# Patient Record
Sex: Female | Born: 1980 | Race: White | Hispanic: No | Marital: Married | Smoking: Current every day smoker
Health system: Southern US, Community
[De-identification: ages and names within clinical notes are randomized; demographics above are authoritative.]

## PROBLEM LIST (undated history)

## (undated) ENCOUNTER — Inpatient Hospital Stay: Payer: Self-pay

## (undated) DIAGNOSIS — G629 Polyneuropathy, unspecified: Secondary | ICD-10-CM

## (undated) DIAGNOSIS — R87629 Unspecified abnormal cytological findings in specimens from vagina: Secondary | ICD-10-CM

## (undated) DIAGNOSIS — O139 Gestational [pregnancy-induced] hypertension without significant proteinuria, unspecified trimester: Secondary | ICD-10-CM

## (undated) DIAGNOSIS — R519 Headache, unspecified: Secondary | ICD-10-CM

## (undated) DIAGNOSIS — D649 Anemia, unspecified: Secondary | ICD-10-CM

## (undated) DIAGNOSIS — M1990 Unspecified osteoarthritis, unspecified site: Secondary | ICD-10-CM

## (undated) HISTORY — PX: REPAIR VAGINAL CUFF: SHX6067

---

## 2003-12-28 ENCOUNTER — Observation Stay: Payer: Self-pay | Admitting: Obstetrics and Gynecology

## 2004-01-22 ENCOUNTER — Inpatient Hospital Stay: Payer: Self-pay | Admitting: Obstetrics and Gynecology

## 2004-02-06 ENCOUNTER — Ambulatory Visit: Payer: Self-pay | Admitting: Family Medicine

## 2006-03-23 ENCOUNTER — Encounter: Payer: Self-pay | Admitting: Family Medicine

## 2006-07-17 ENCOUNTER — Inpatient Hospital Stay: Payer: Self-pay | Admitting: Obstetrics and Gynecology

## 2009-10-10 ENCOUNTER — Emergency Department: Payer: Self-pay | Admitting: Internal Medicine

## 2009-12-17 ENCOUNTER — Emergency Department: Payer: Self-pay | Admitting: Unknown Physician Specialty

## 2010-12-06 ENCOUNTER — Ambulatory Visit: Payer: Self-pay

## 2010-12-07 ENCOUNTER — Ambulatory Visit: Payer: Self-pay | Admitting: Obstetrics and Gynecology

## 2011-01-03 ENCOUNTER — Observation Stay: Payer: Self-pay | Admitting: Obstetrics and Gynecology

## 2011-01-18 ENCOUNTER — Observation Stay: Payer: Self-pay | Admitting: Obstetrics and Gynecology

## 2011-01-21 ENCOUNTER — Observation Stay: Payer: Self-pay

## 2011-02-05 ENCOUNTER — Inpatient Hospital Stay: Payer: Self-pay | Admitting: Obstetrics and Gynecology

## 2011-02-05 DIAGNOSIS — O1495 Unspecified pre-eclampsia, complicating the puerperium: Secondary | ICD-10-CM

## 2011-02-12 ENCOUNTER — Emergency Department: Payer: Self-pay | Admitting: Internal Medicine

## 2011-02-12 IMAGING — CT CT HEAD WITHOUT CONTRAST
2 series · 16 of 30 positions shown, 20 images · non-contrast
Comparison: none

REASON FOR EXAM: ha
COMMENTS:

PROCEDURE:     CT  - CT HEAD WITHOUT CONTRAST  - [DATE]  [DATE]
RESULT:     History: Headache.

[Series 2: without · axial · non-contrast · 0.44mm/px · z∈[+146,+266]mm · 13 of 30 slices shown, 17 images]
[im 3/30  brain]
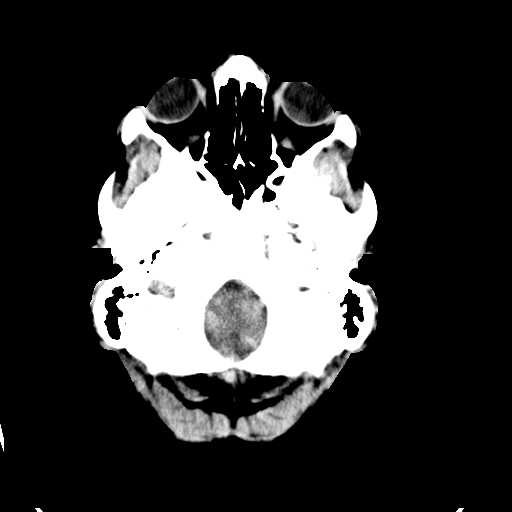
[im 3/30  bone]
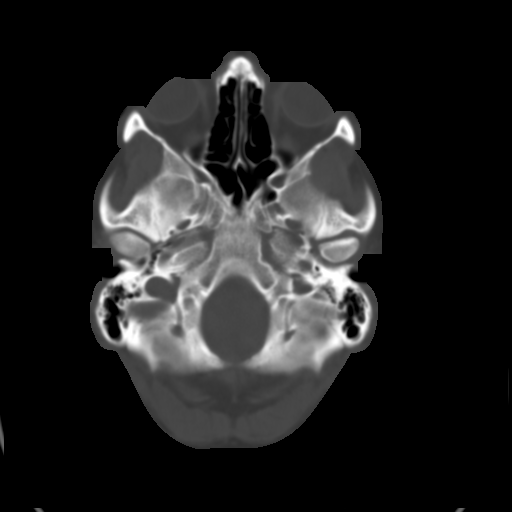
[im 5/30  brain]
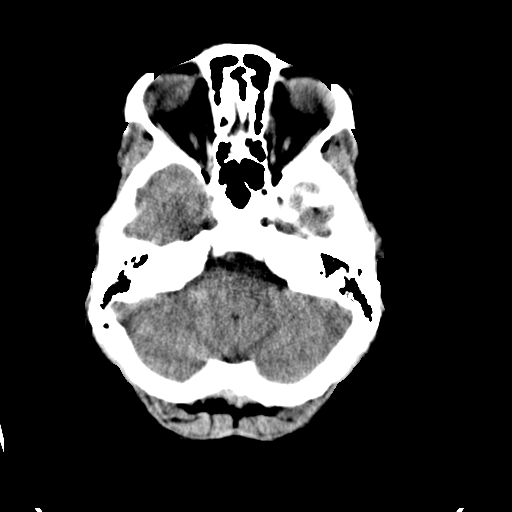
[im 7/30  brain]
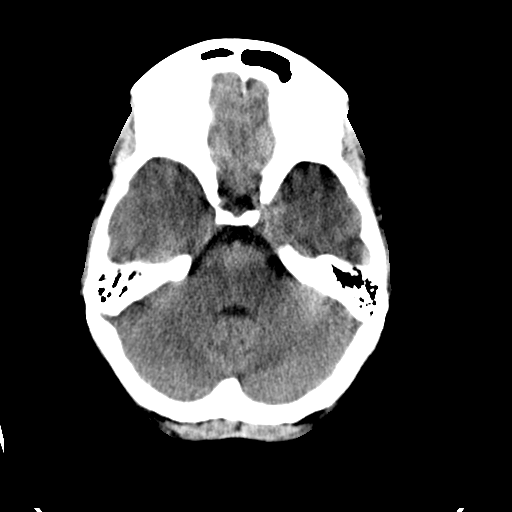
[im 9/30  brain]
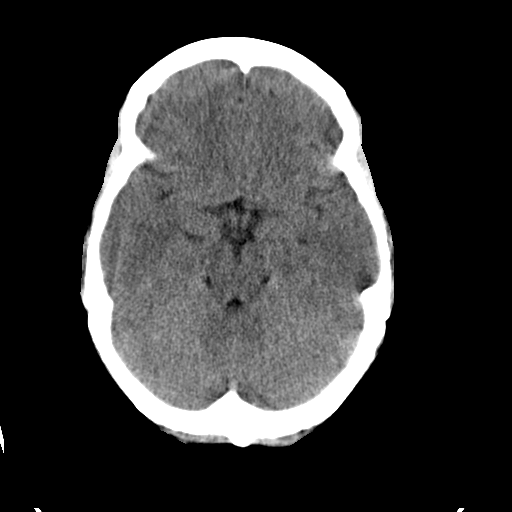
[im 11/30  brain]
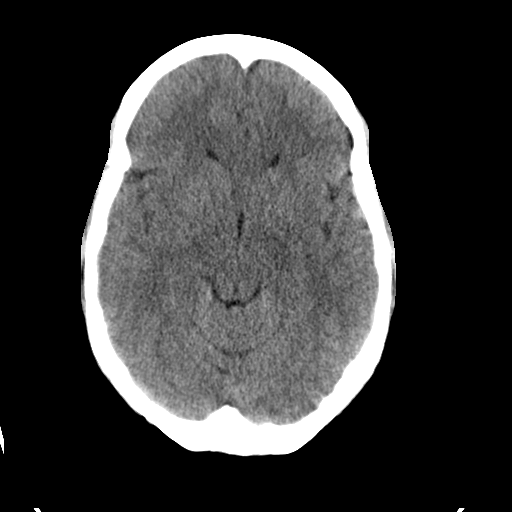
[im 11/30  bone]
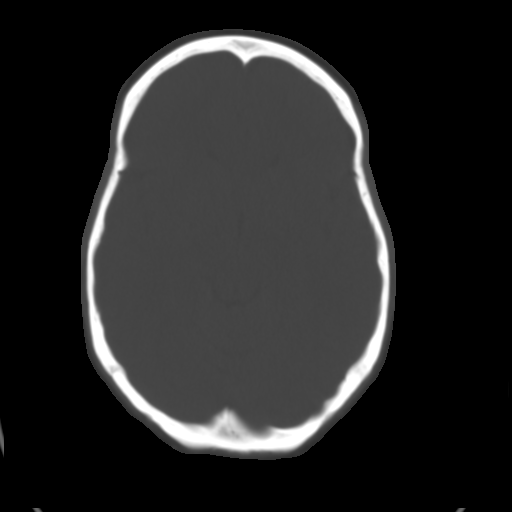
[im 13/30  brain]
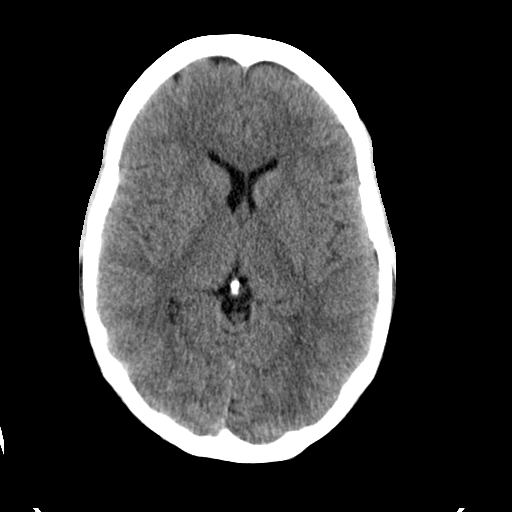
[im 15/30  brain]
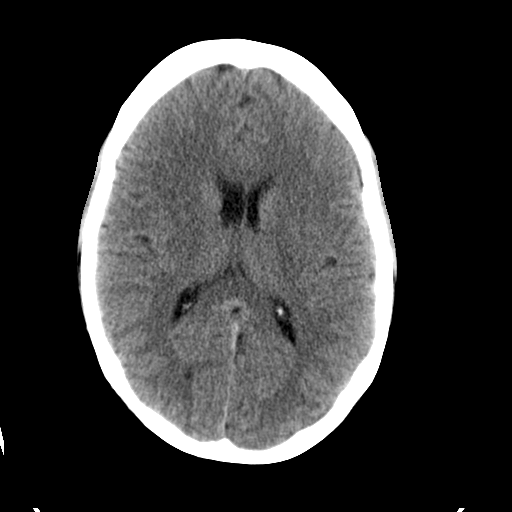
[im 17/30  brain]
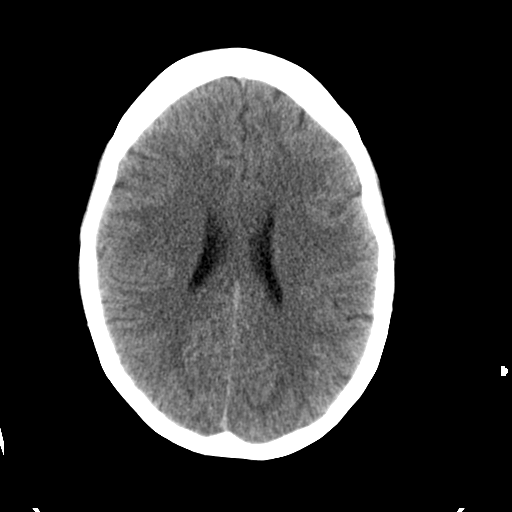
[im 19/30  brain]
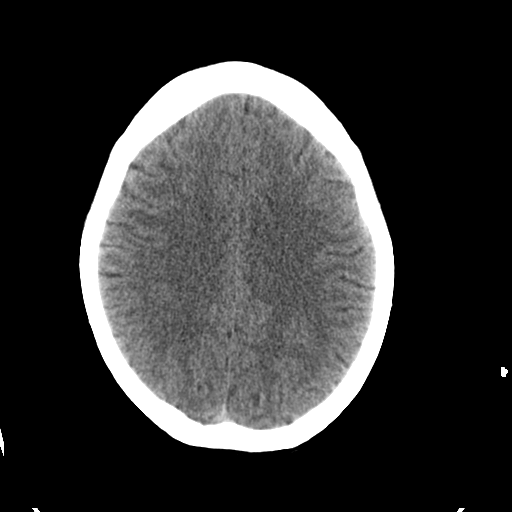
[im 19/30  bone]
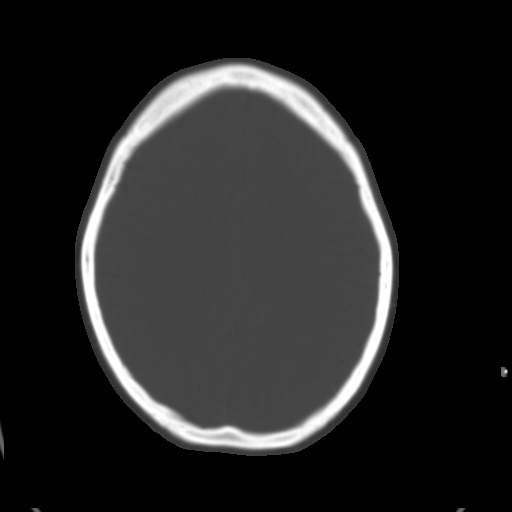
[im 21/30  brain]
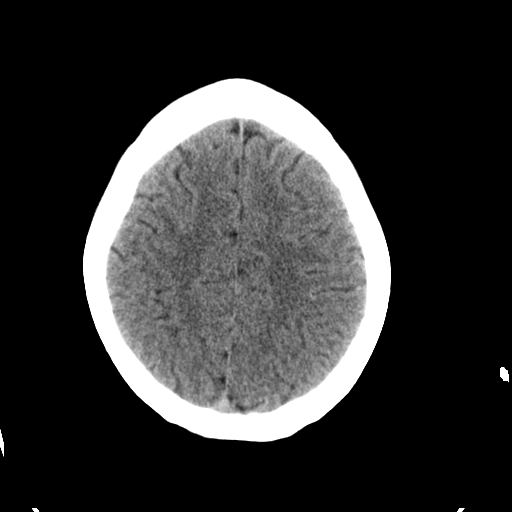
[im 23/30  brain]
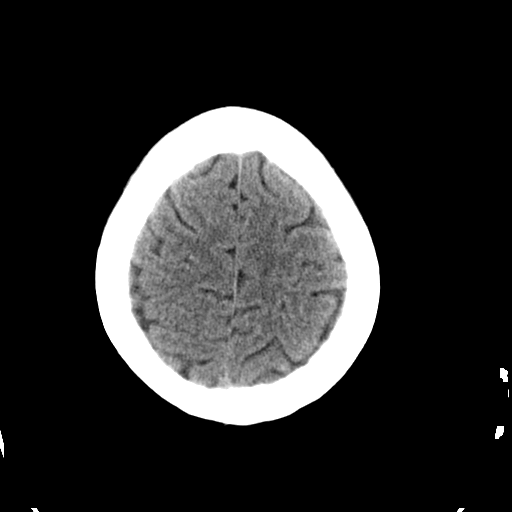
[im 25/30  brain]
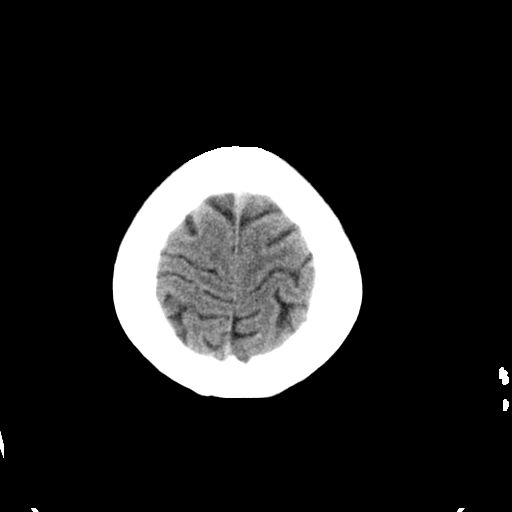
[im 27/30  brain]
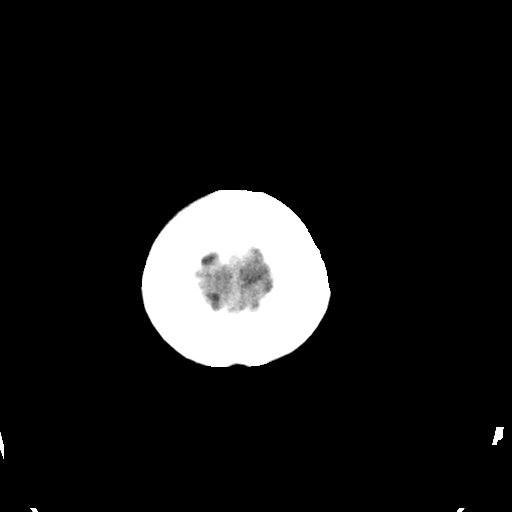
[im 27/30  bone]
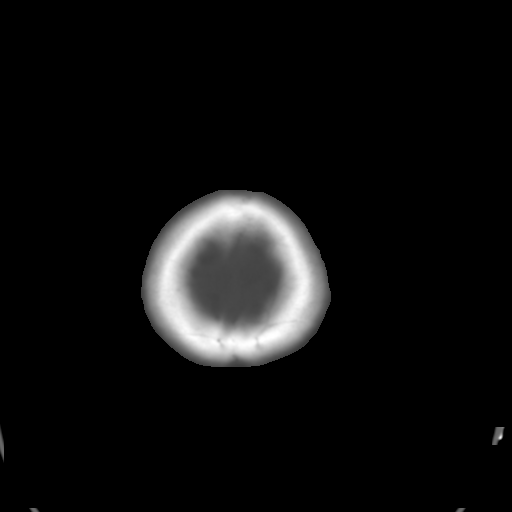

[Series 3: bone · axial · 0.44mm/px · z∈[+146,+186]mm · 3 of 30 slices shown]
[im 3/30  bone]
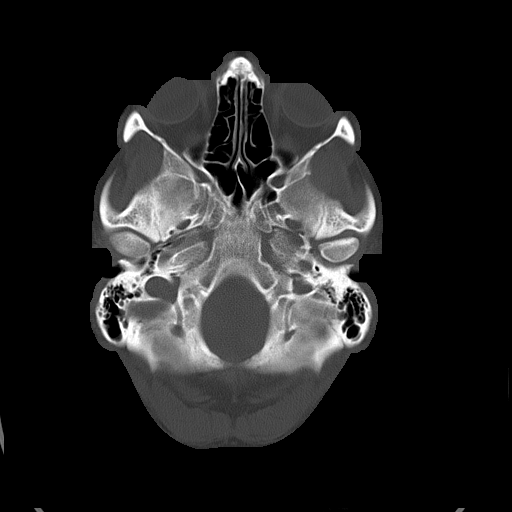
[im 7/30  bone]
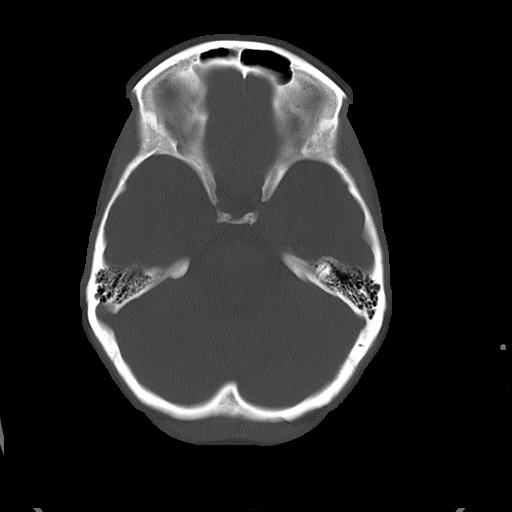
[im 11/30  bone]
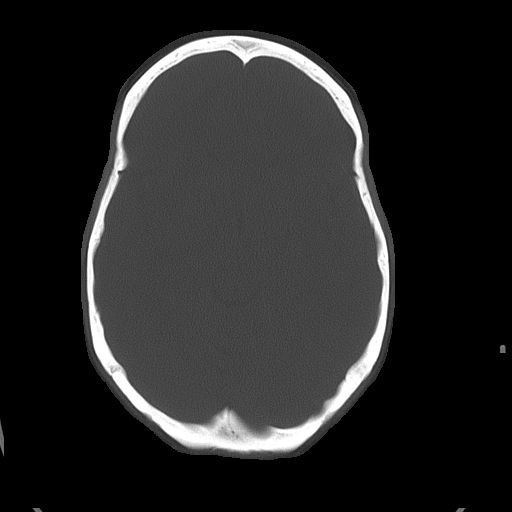

[16 of 30 positions shown; findings below may reference images not displayed]

FINDINGS: Standard nonenhanced CT obtained. No mass lesion. No
hydrocephalus. No hemorrhage. No acute bony abnormality.
IMPRESSION: No acute abnormality.

## 2011-02-12 IMAGING — CR DG CHEST 2V
1 series · 2 of 2 positions shown · non-contrast
Comparison: none

REASON FOR EXAM: sob
COMMENTS:

[Series 1: w chest pa · 0.14mm/px · 2 of 2 slices shown]
[im 1/2]
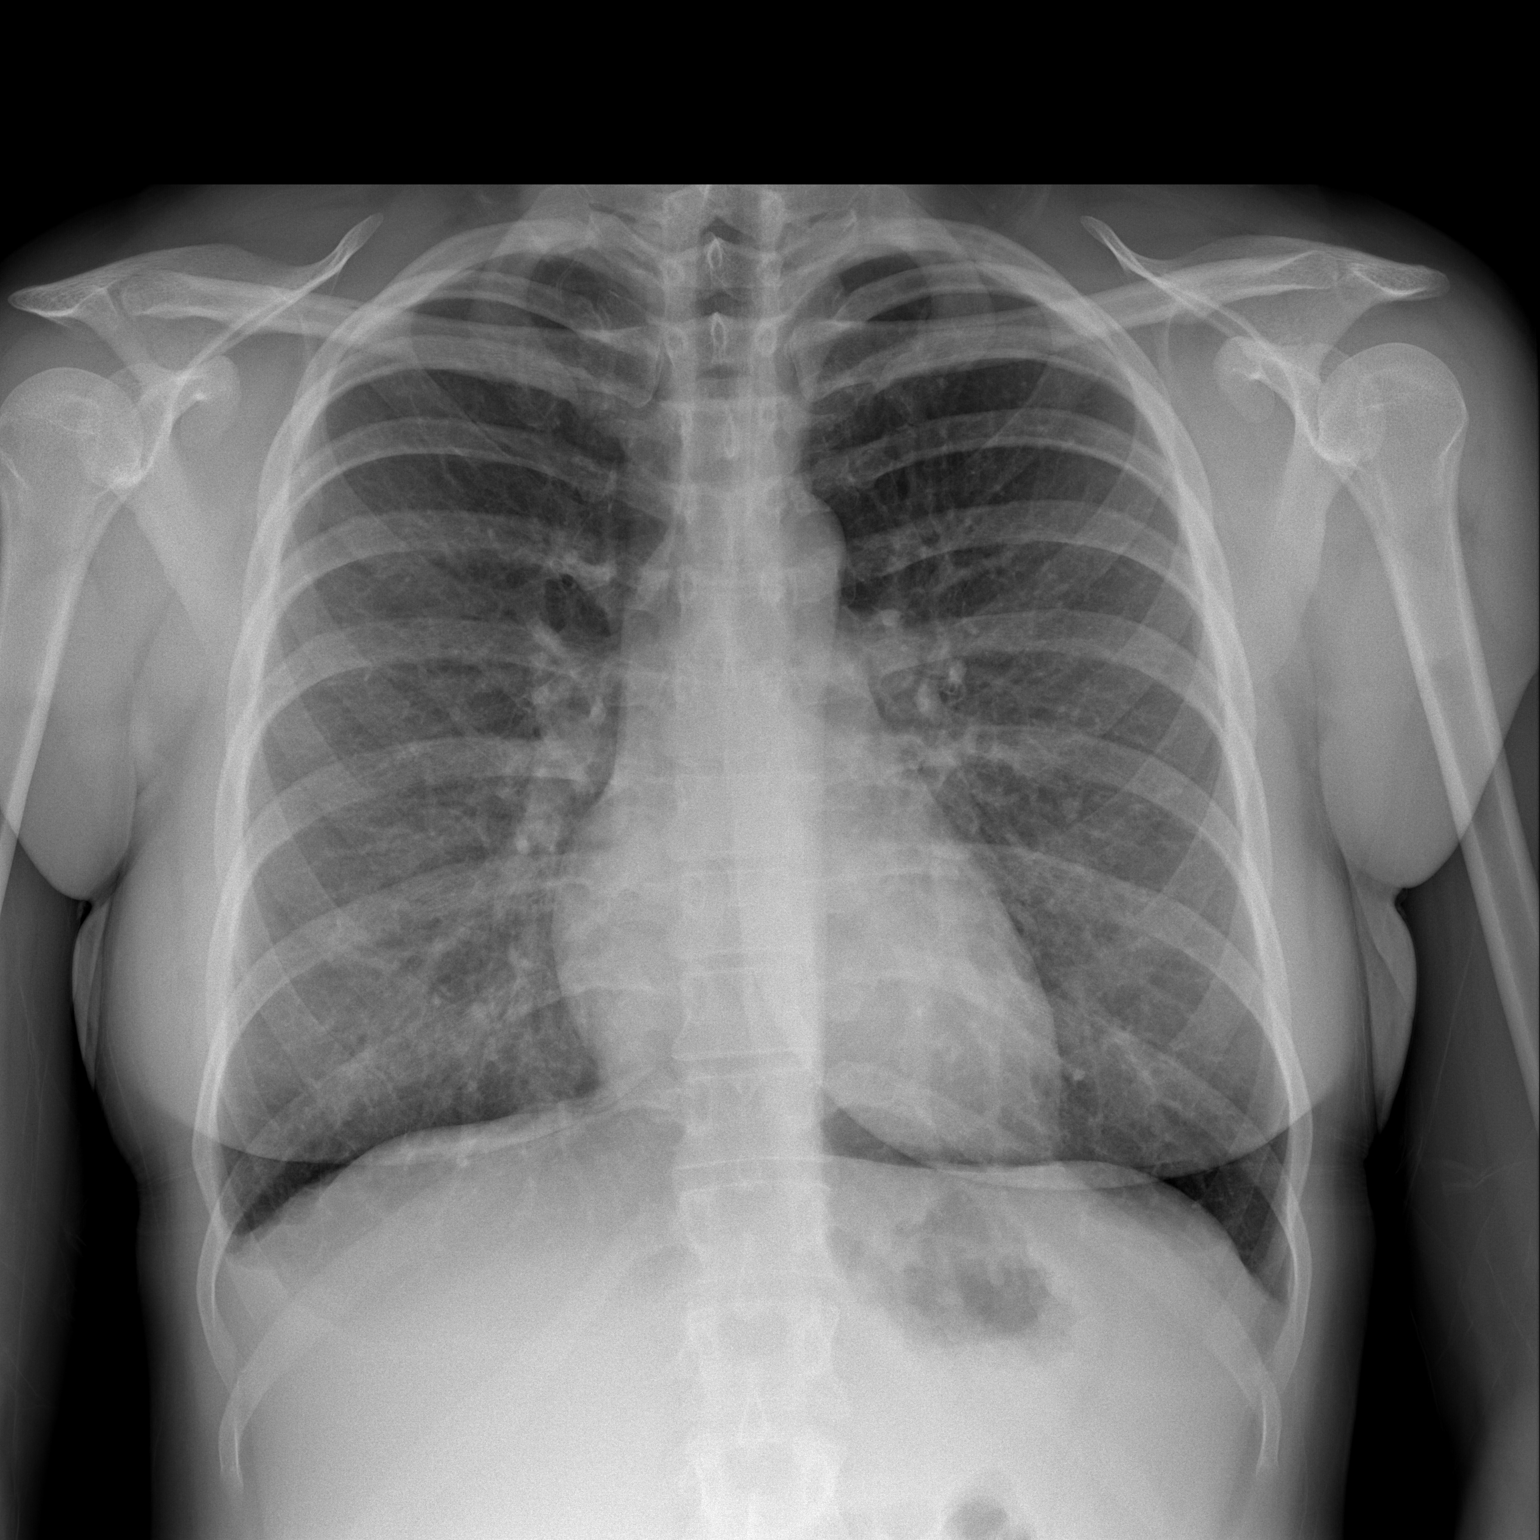
[im 2/2]
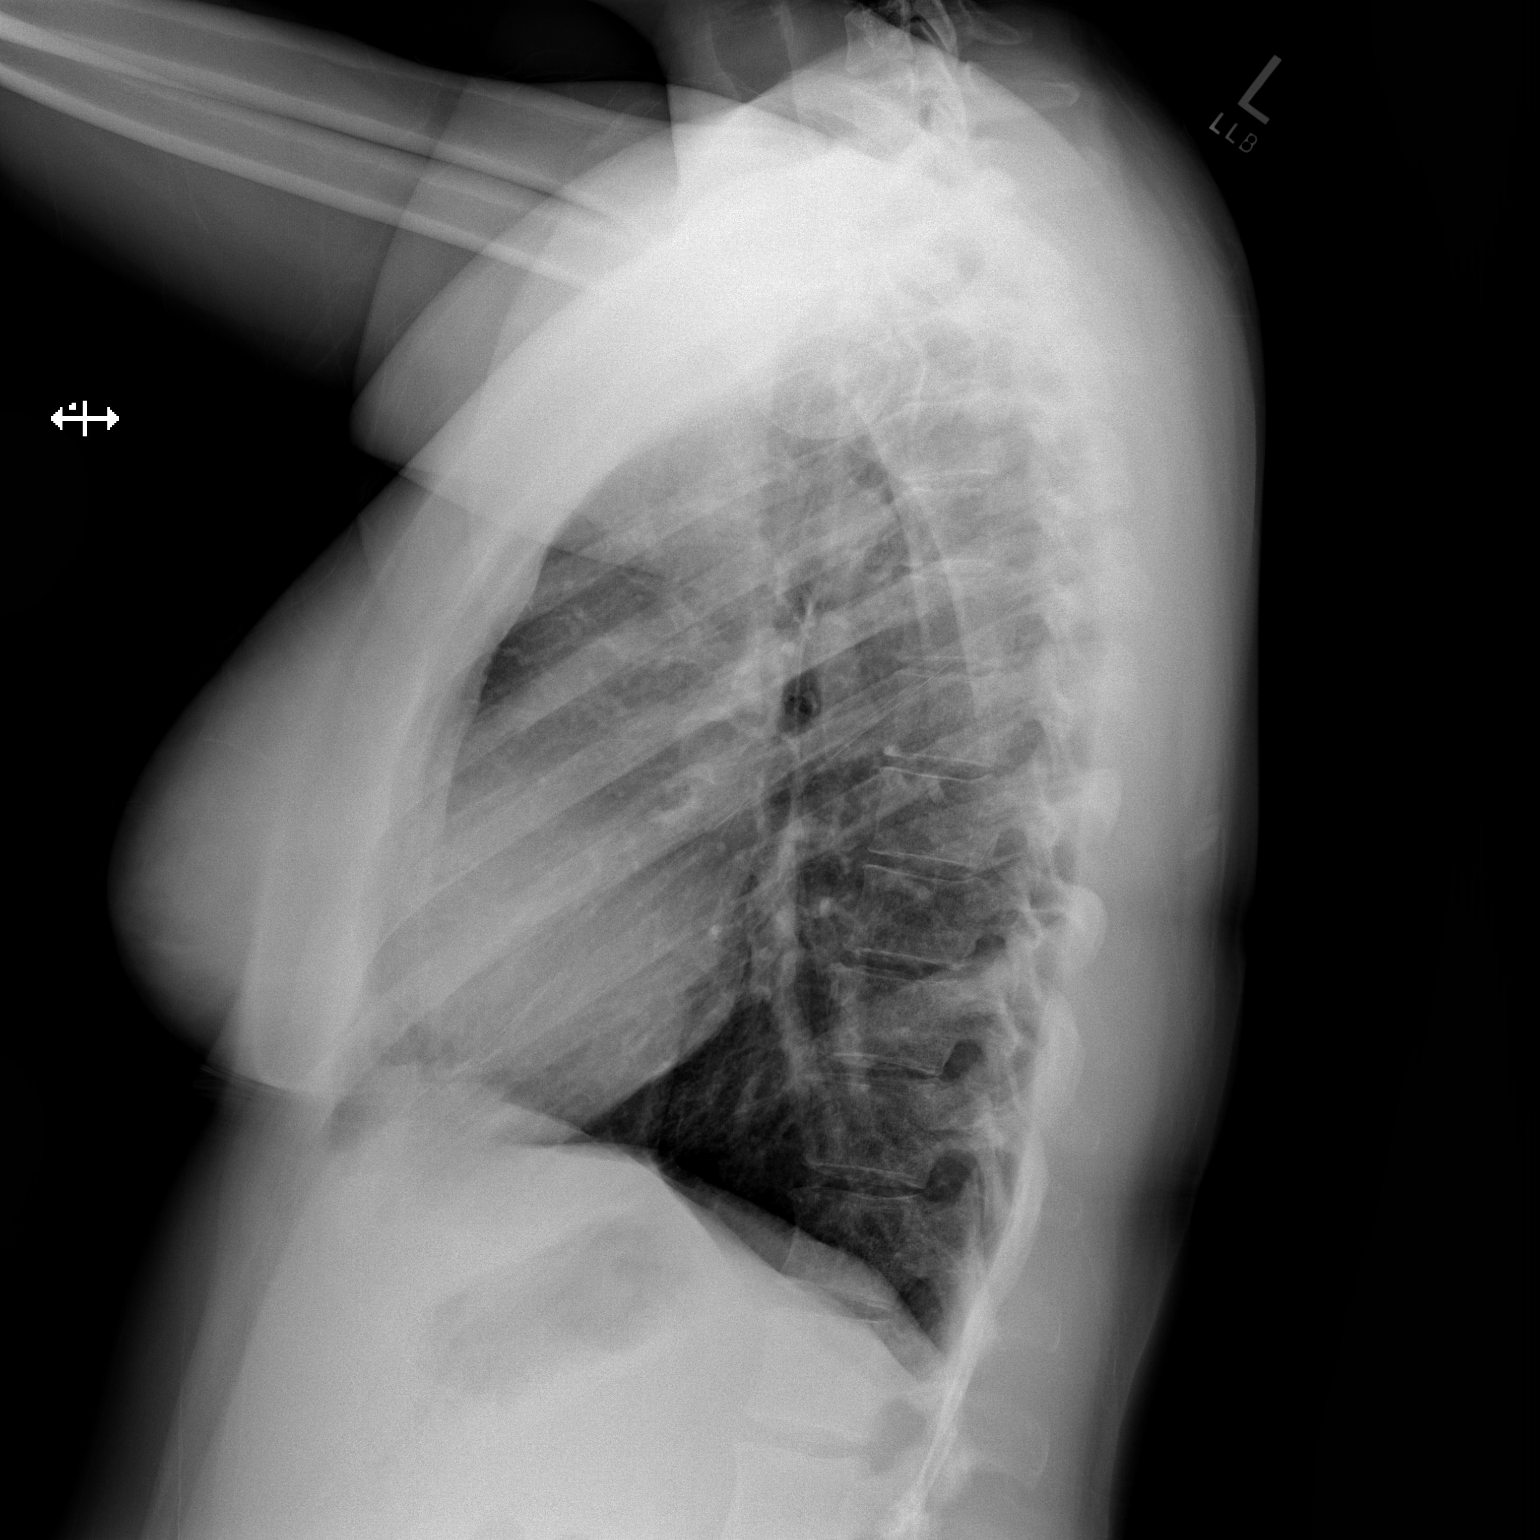

[2 of 2 positions shown; findings below may reference images not displayed]

PROCEDURE:     DXR - DXR CHEST PA (OR AP) AND LATERAL  - [DATE]  [DATE]

RESULT:     There is no previous exam for comparison.

The lungs are clear. The heart and pulmonary vessels are normal. The bony
and mediastinal structures are unremarkable. There is no effusion. There is
no pneumothorax or evidence of congestive failure.
IMPRESSION: No acute cardiopulmonary disease.

## 2011-11-03 ENCOUNTER — Ambulatory Visit: Payer: Self-pay | Admitting: Family Medicine

## 2011-11-03 DIAGNOSIS — Z0289 Encounter for other administrative examinations: Secondary | ICD-10-CM

## 2012-06-04 ENCOUNTER — Ambulatory Visit: Payer: Self-pay | Admitting: Family Medicine

## 2012-06-04 IMAGING — US ABDOMEN ULTRASOUND
1 series · 14 of 25 positions shown · non-contrast
Comparison: none

REASON FOR EXAM: CALL REPORT [PHONE_NUMBER] RUQ Pain Eval Gallbladder KRISTYO
COMMENTS:

[Series 1: abdomen ultrasound · 0.26mm/px · 14 of 80 slices shown]
[im 1/80]
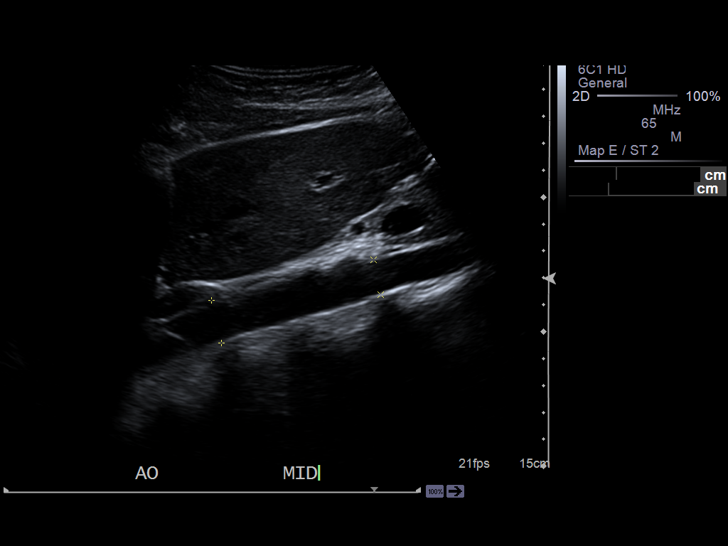
[im 7/80]
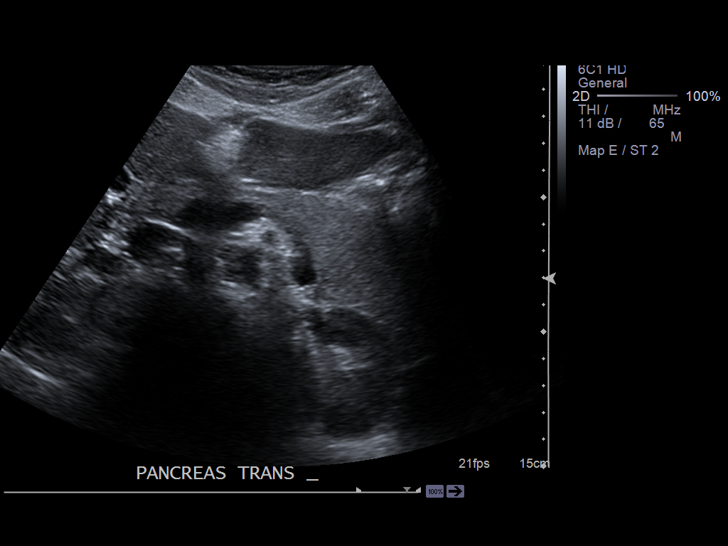
[im 14/80]
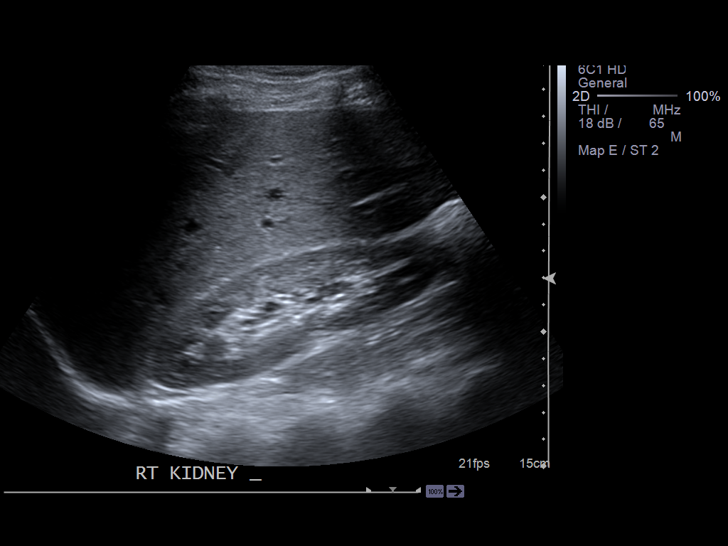
[im 20/80]
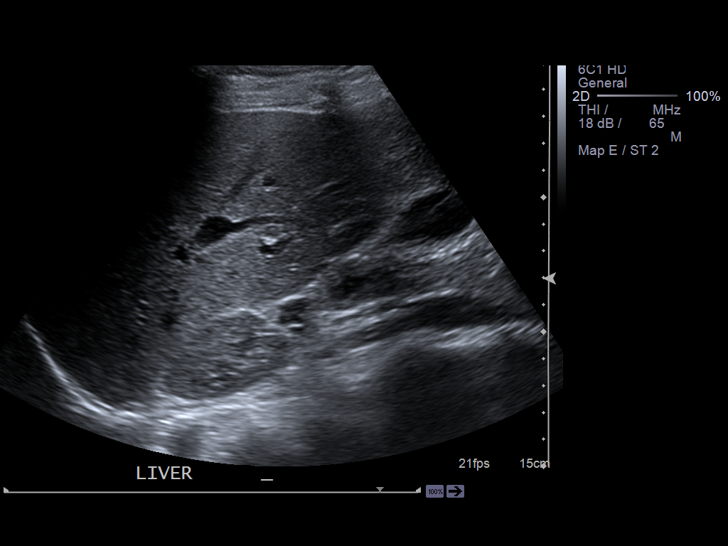
[im 27/80]
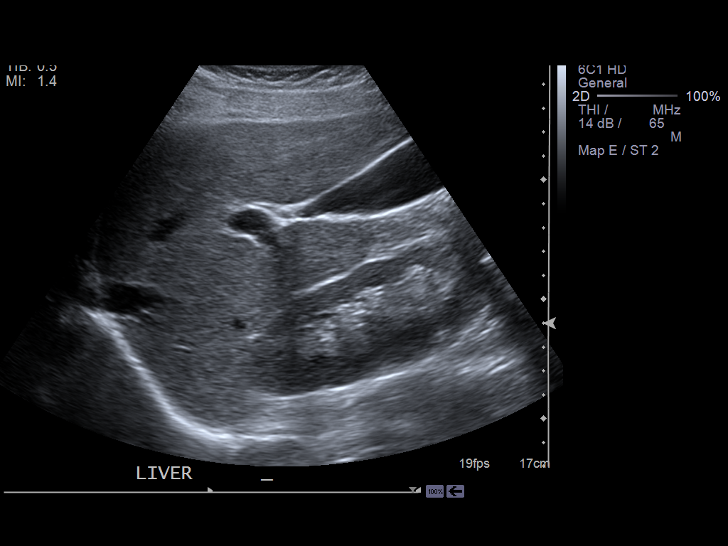
[im 30/80]
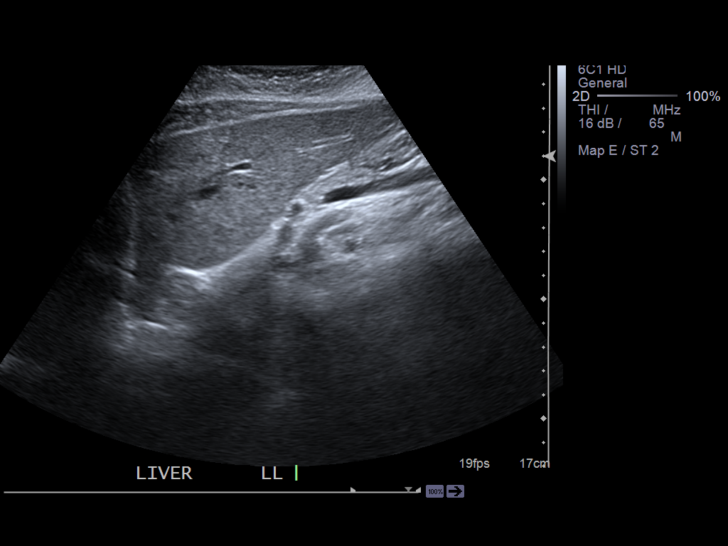
[im 37/80]
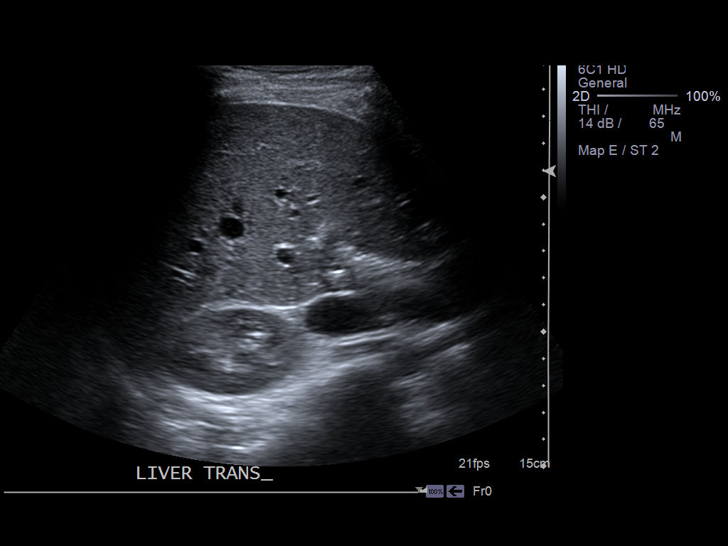
[im 43/80]
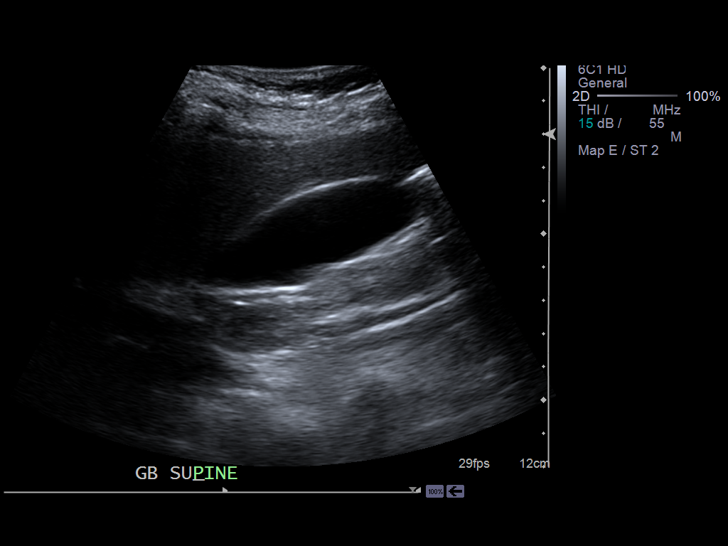
[im 50/80]
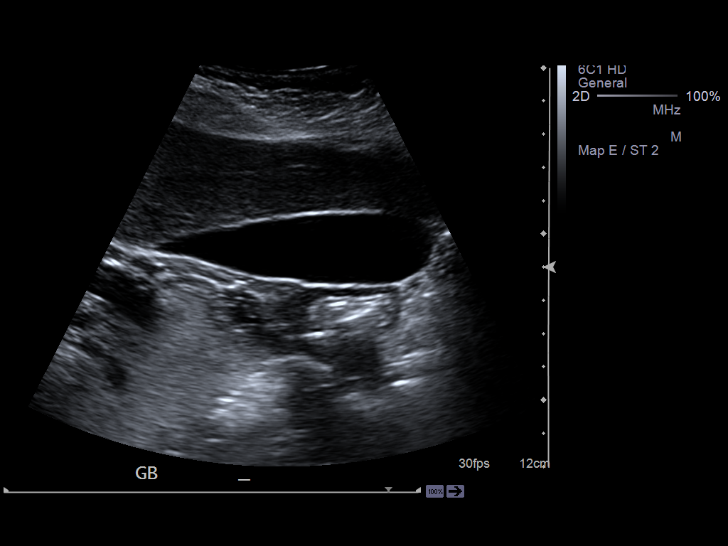
[im 53/80]
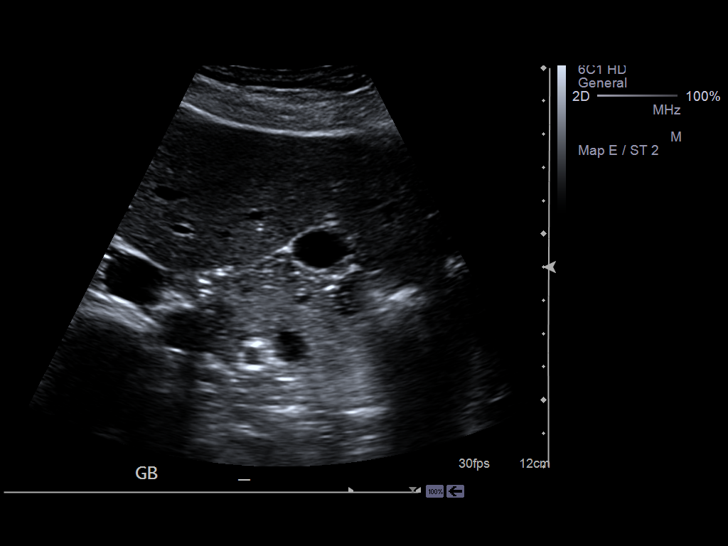
[im 60/80]
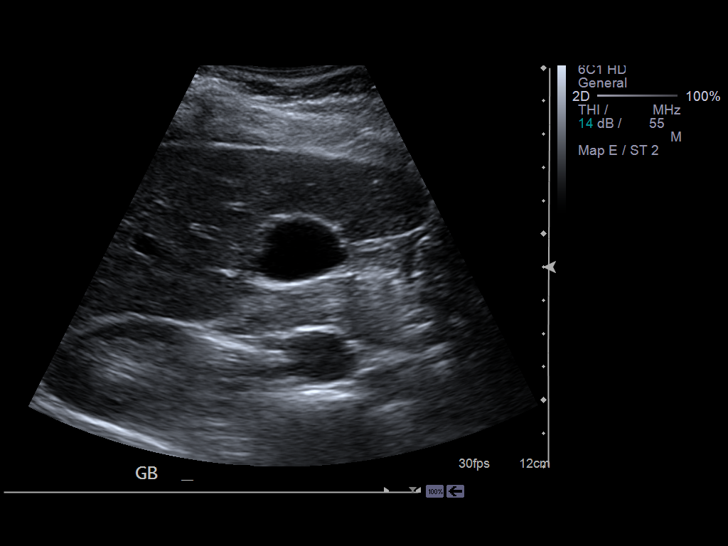
[im 66/80]
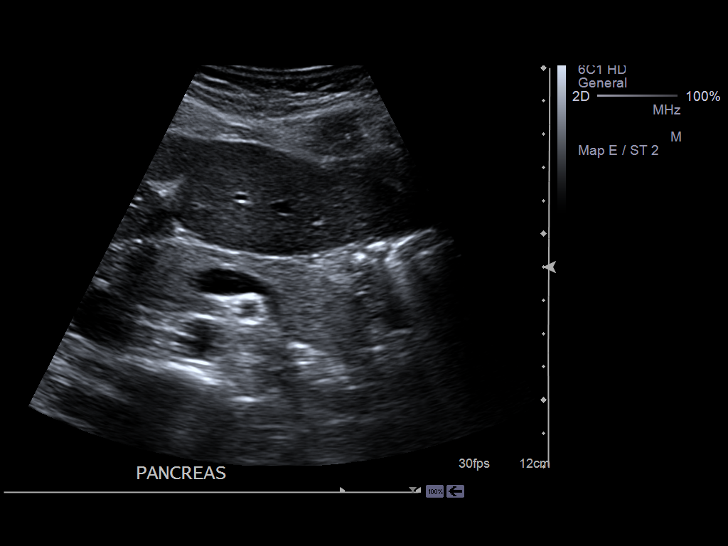
[im 73/80]
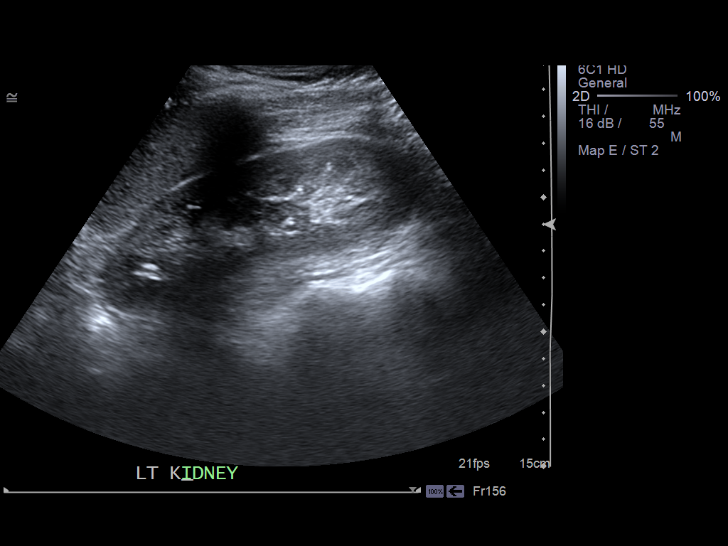
[im 80/80]
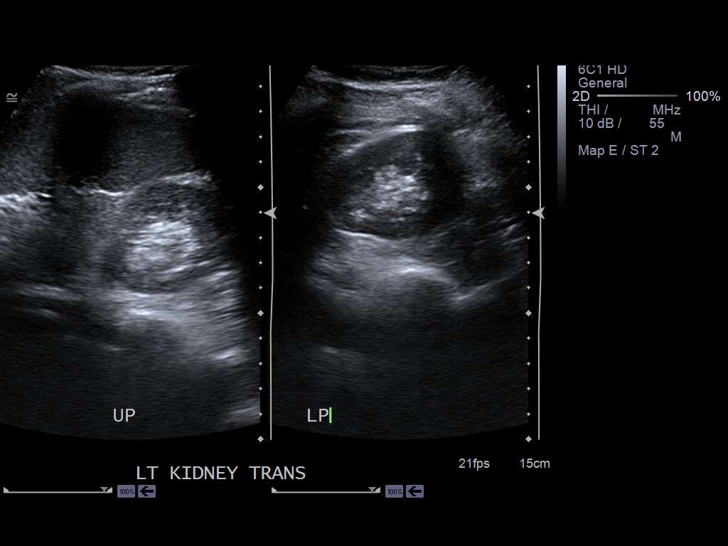

[14 of 25 positions shown; findings below may reference images not displayed]

PROCEDURE:     US  - US ABDOMEN GENERAL SURVEY  - [DATE] [DATE]

RESULT:     The liver exhibits normal echotexture with no focal mass nor
ductal dilation. Portal venous flow is normal in direction toward the liver.
The gallbladder is adequately distended with no evidence of stones, wall
thickening, or pericholecystic fluid. There is no positive sonographic
Murphy's sign. The common bile duct is normal at 4.3 mm in diameter.

The pancreas exhibits normal echotexture with no focal mass or ductal
dilation. The spleen is normal in echotexture and measures 11 cm in greatest
dimension. The abdominal aorta, inferior vena cava, and kidneys are normal
in appearance.
IMPRESSION: Normal abdominal ultrasound examination.

[REDACTED]

## 2012-09-23 ENCOUNTER — Other Ambulatory Visit: Payer: Self-pay | Admitting: Urgent Care

## 2012-09-23 ENCOUNTER — Ambulatory Visit: Payer: Self-pay | Admitting: Urgent Care

## 2012-09-23 IMAGING — NM NUCLEAR MEDICINE HEPATOHBILIARY INCLUDE GB
3 series · 22 of 22 positions shown · non-contrast
Comparison: none

REASON FOR EXAM: RUQ pain
COMMENTS:
TECHNIQUE: Following the uneventful intravenous infusion of
radiopharmaceutical, dynamic anterior regional imaging was obtained over the
liver for 70 minutes.

[Series 1000: gallbladder dynamic · 4.80mm/px · 6 of 120 frames shown]
[frame 11/120]
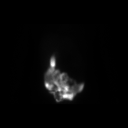
[frame 31/120]
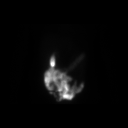
[frame 51/120]
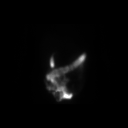
[frame 71/120]
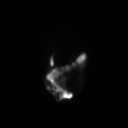
[frame 91/120]
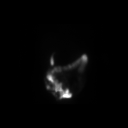
[frame 111/120]
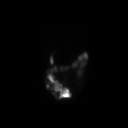

[Series 1000: gallbladder dynamic (results) · 4.80mm/px · 6 of 120 frames shown]
[frame 11/120]
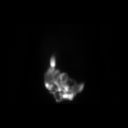
[frame 31/120]
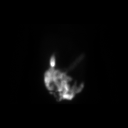
[frame 51/120]
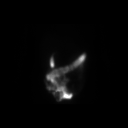
[frame 71/120]
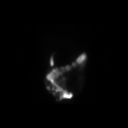
[frame 91/120]
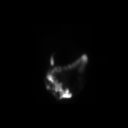
[frame 111/120]
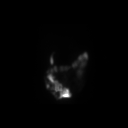

[Series 1000: gallbladder statics · 4.80mm/px · 10 of 10 slices shown]
[im 1/10]
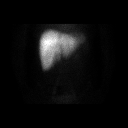
[im 2/10]
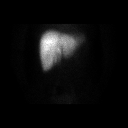
[im 3/10]
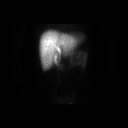
[im 4/10]
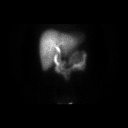
[im 5/10]
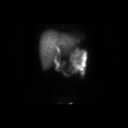
[im 6/10]
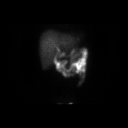
[im 7/10]
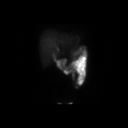
[im 8/10]
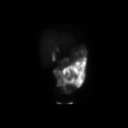
[im 9/10]
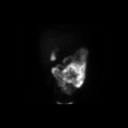
[im 10/10]
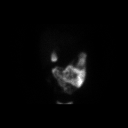

[22 of 22 positions shown; findings below may reference images not displayed]

PROCEDURE:     KNM - KNM HEPATO W/GB EJECT FRACTION  - [DATE]  [DATE]

RESULT:     Comparison: None.

Radiopharmaceutical: 8.48 mCi [97] labeled Choletec was administered
intravenously. Due to the shortage of sincalide, once the gallbladder had
accumulated tracer, the patient was given 8 ounces of Ensure orally.
FINDINGS: There is immediate homogeneous uptake of radiotracer in the liver.
Filling of the gallbladder begins at 40 minutes. Radiotracer uptake is
present in the small bowel at 10 minutes.

When gallbladder filling was complete, the patient was given oral Ensure. At
60 minutes, the total ejection fraction was 63%, which is within normal
limits.
IMPRESSION: 1. The common bile duct and cystic duct are patent.
2. Normal gallbladder ejection fraction.

[REDACTED]

## 2012-09-23 IMAGING — US US ABDOMEN LIMITED SLG ORGAN/ASCITES
1 series · 14 of 25 positions shown · non-contrast
Comparison: none

REASON FOR EXAM: abd RUQ pain   gallbladder
COMMENTS:

PROCEDURE:     ARWAN - ARWAN ABDOMEN LTD 1 ORGAN OR QUAD  - [DATE] [DATE]
RESULT:     Comparison: Abdominal ultrasound [DATE]
TECHNIQUE: Multiple grayscale and color Doppler images were obtained of the
right upper quadrant.

[Series 1: us abdomen limited slg organ/ascites · 0.28mm/px · 14 of 45 slices shown]
[im 1/45]
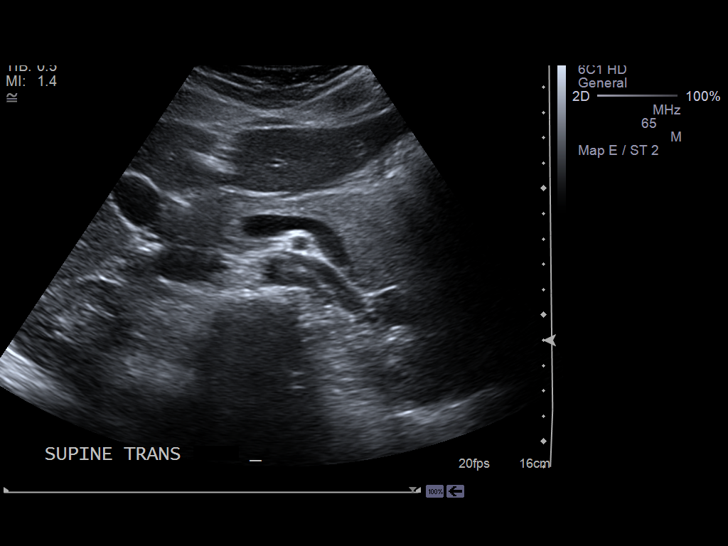
[im 4/45]
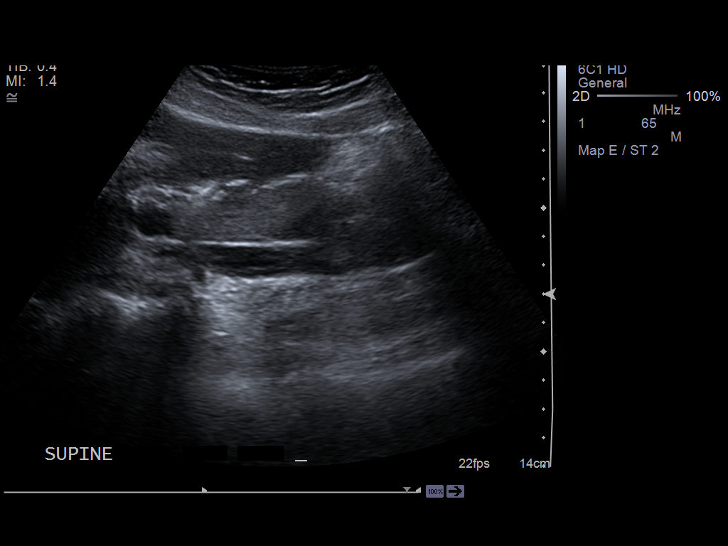
[im 8/45]
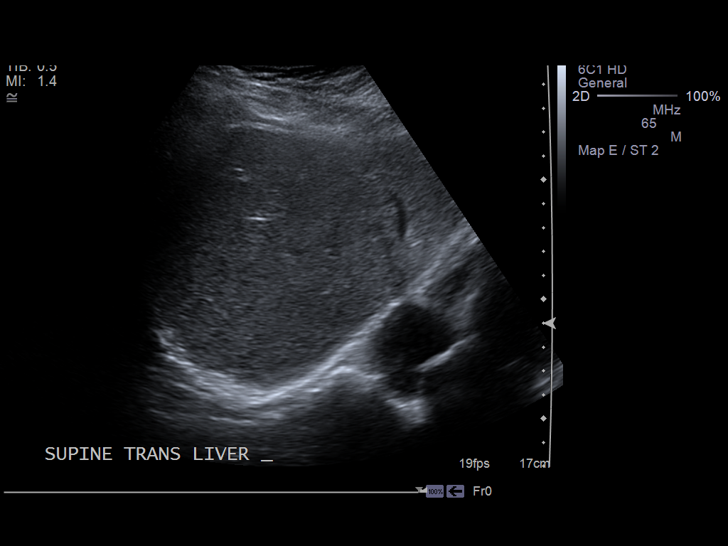
[im 12/45]
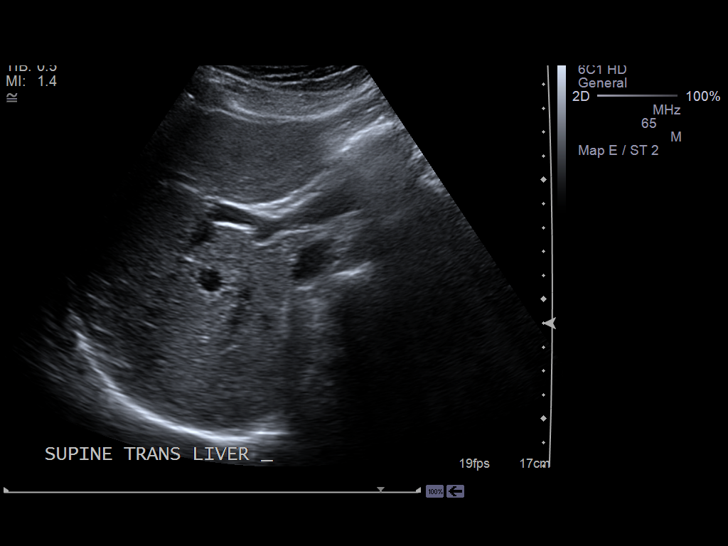
[im 15/45]
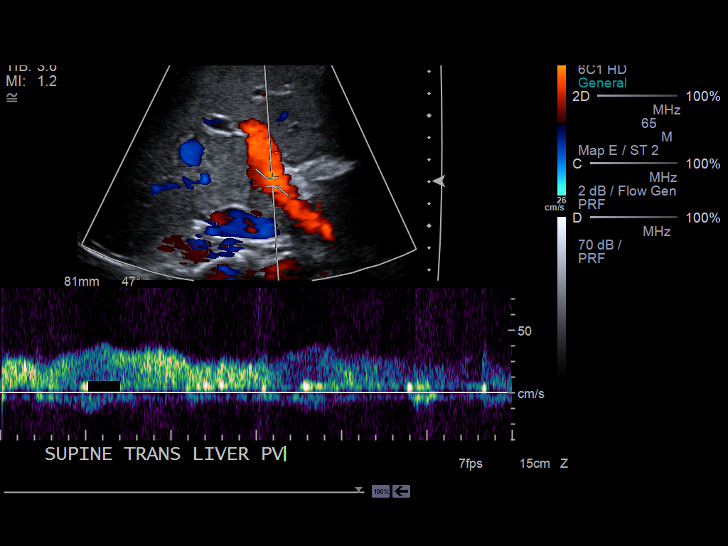
[im 17/45]
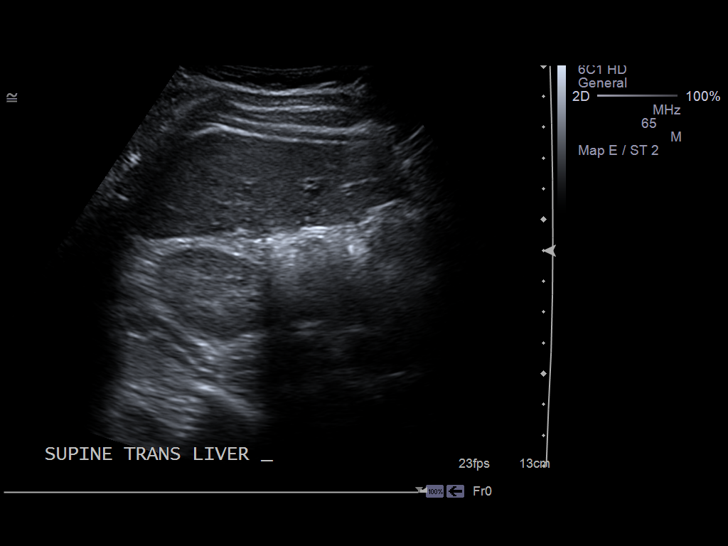
[im 21/45]
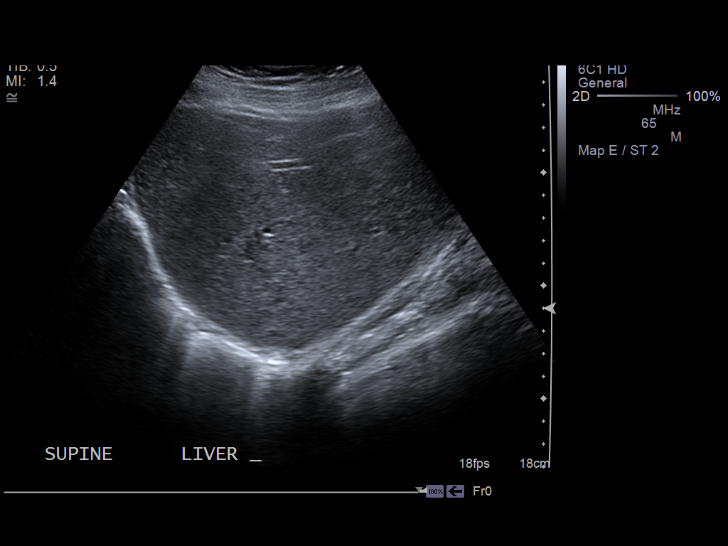
[im 24/45]
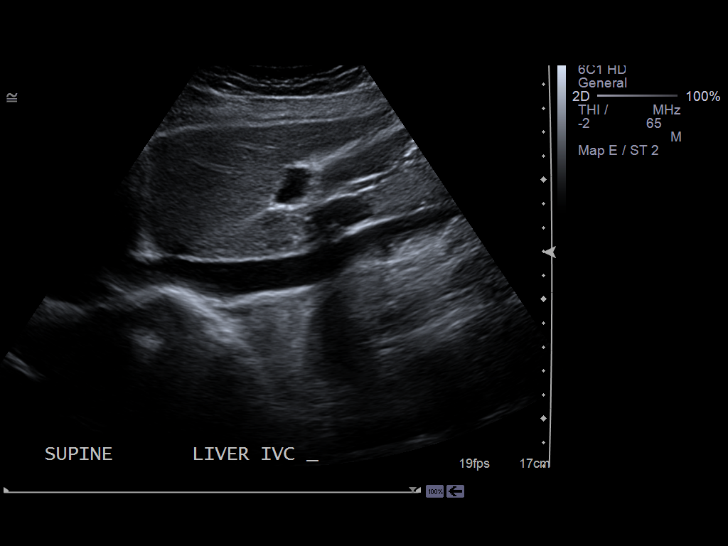
[im 28/45]
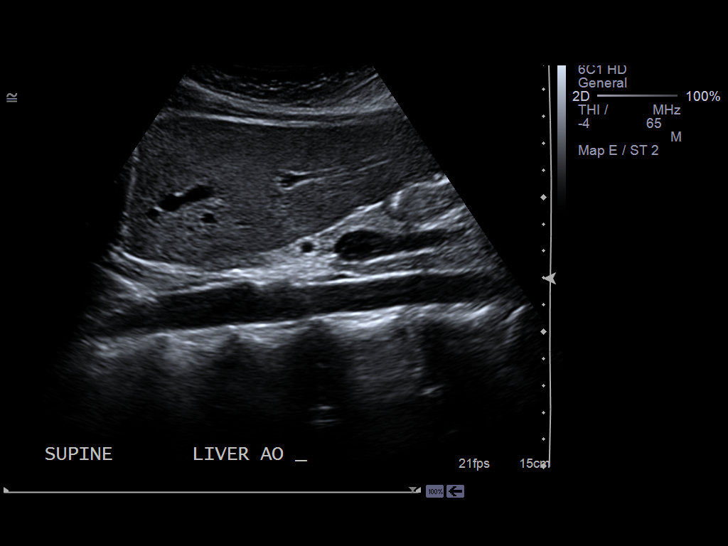
[im 30/45]
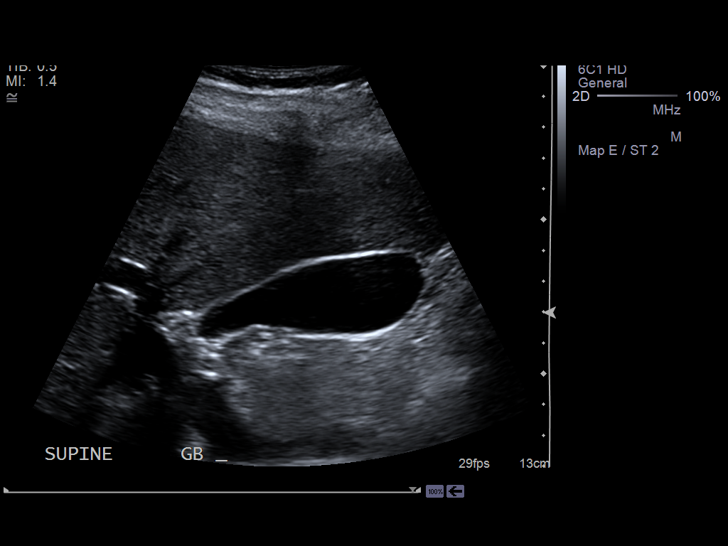
[im 34/45]
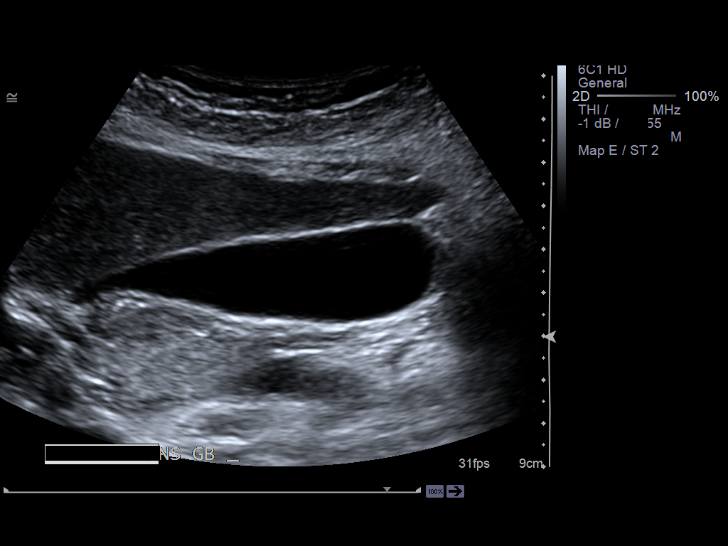
[im 37/45]
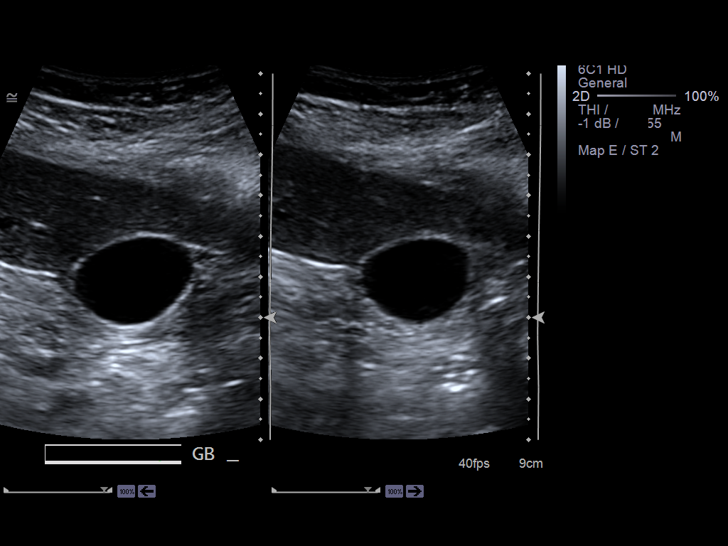
[im 41/45]
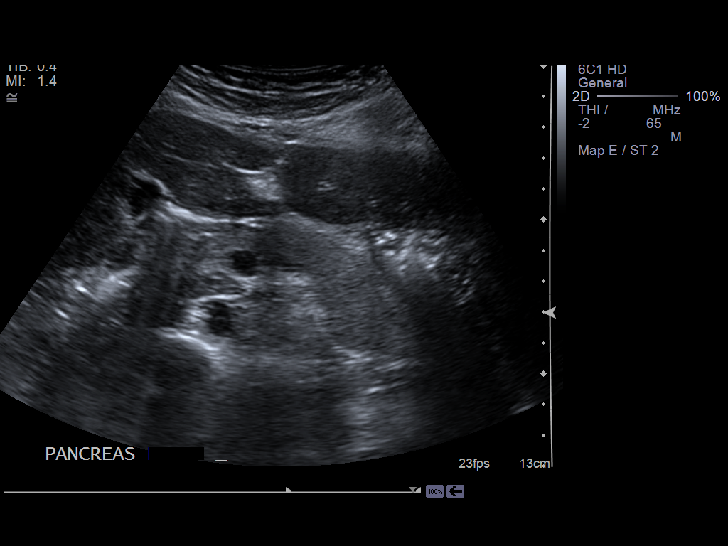
[im 45/45]
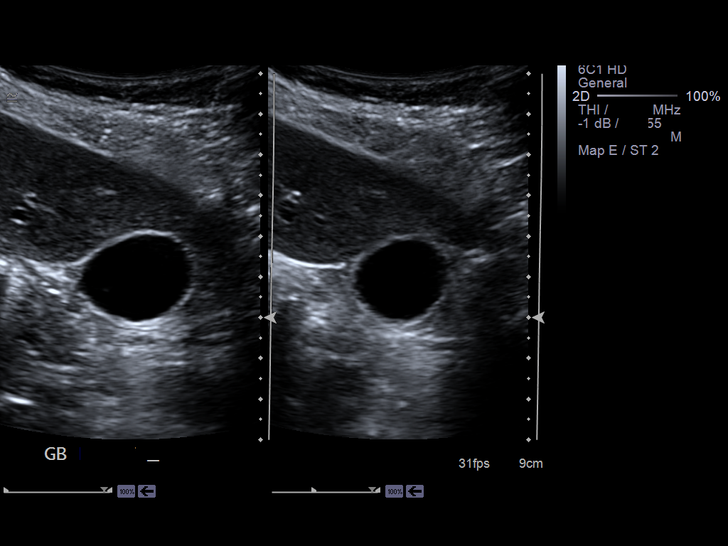

[14 of 25 positions shown; findings below may reference images not displayed]

FINDINGS: The visualized pancreas and liver are unremarkable. The main portal vein is
patent. The gallbladder is normal. Sonographic Murphy sign was negative. The
common bile duct measures 3 mm in diameter.
IMPRESSION: No acute findings. Normal gallbladder.

[REDACTED]

## 2013-07-31 ENCOUNTER — Observation Stay: Payer: Self-pay

## 2013-08-08 ENCOUNTER — Inpatient Hospital Stay: Payer: Self-pay

## 2013-08-08 LAB — CBC WITH DIFFERENTIAL/PLATELET
BASOS PCT: 0.4 %
Basophil #: 0 10*3/uL (ref 0.0–0.1)
EOS PCT: 1.2 %
Eosinophil #: 0.1 10*3/uL (ref 0.0–0.7)
HCT: 39.6 % (ref 35.0–47.0)
HGB: 13.6 g/dL (ref 12.0–16.0)
Lymphocyte #: 2.1 10*3/uL (ref 1.0–3.6)
Lymphocyte %: 19.2 %
MCH: 30.7 pg (ref 26.0–34.0)
MCHC: 34.2 g/dL (ref 32.0–36.0)
MCV: 90 fL (ref 80–100)
MONO ABS: 0.7 x10 3/mm (ref 0.2–0.9)
MONOS PCT: 6.2 %
Neutrophil #: 8.1 10*3/uL — ABNORMAL HIGH (ref 1.4–6.5)
Neutrophil %: 73 %
Platelet: 217 10*3/uL (ref 150–440)
RBC: 4.42 10*6/uL (ref 3.80–5.20)
RDW: 13.2 % (ref 11.5–14.5)
WBC: 11.1 10*3/uL — ABNORMAL HIGH (ref 3.6–11.0)

## 2013-08-10 LAB — HEMATOCRIT: HCT: 37 % (ref 35.0–47.0)

## 2014-01-31 LAB — OB RESULTS CONSOLE GC/CHLAMYDIA
CHLAMYDIA, DNA PROBE: NEGATIVE
GC PROBE AMP, GENITAL: NEGATIVE

## 2014-01-31 LAB — OB RESULTS CONSOLE HIV ANTIBODY (ROUTINE TESTING): HIV: NONREACTIVE

## 2014-01-31 LAB — OB RESULTS CONSOLE RUBELLA ANTIBODY, IGM: Rubella: IMMUNE

## 2014-01-31 LAB — OB RESULTS CONSOLE ABO/RH: RH Type: POSITIVE

## 2014-01-31 LAB — OB RESULTS CONSOLE RPR: RPR: NONREACTIVE

## 2014-01-31 LAB — OB RESULTS CONSOLE HEPATITIS B SURFACE ANTIGEN: Hepatitis B Surface Ag: NEGATIVE

## 2014-01-31 LAB — OB RESULTS CONSOLE VARICELLA ZOSTER ANTIBODY, IGG: Varicella: IMMUNE

## 2014-01-31 LAB — OB RESULTS CONSOLE ANTIBODY SCREEN: ANTIBODY SCREEN: NEGATIVE

## 2014-02-23 ENCOUNTER — Encounter: Payer: Self-pay | Admitting: Obstetrics and Gynecology

## 2014-03-24 NOTE — L&D Delivery Note (Signed)
VAGINAL DELIVERY NOTE:  Date of Delivery: 08/16/2014 Primary OB: [facility] Gestational Age/EDD: 4380w3d 08/27/2014, by Other Basis Antepartum complications: Previous pre-ecclampsia with last pregnancy at 1 week pp Attending Physician: Milon Scorearon Jones, CNM with Dr Christeen DouglasBethany Beasley as Attending Delivery Type: spontaneous vaginal delivery  Anesthesia: none Laceration: none Episiotomy: none Placenta: spontaneous Intrapartum complications: None Estimated Blood Loss: 100ml GBS: Positive Procedure Details:CTSP as pt is C/C/+2 station. Arrived to find pt being prepared for delivery. Epidural is working. Enc to push and NSVD of viable female infant in ROA position. Vtx with nuchal hand noted and Cord over Rt shoulder. Pushing and cord was loose enough to deliver patient. Ant, post shoulder and body del at Delivery Note At 12:10 AM a viable female was delivered via Vaginal, Spontaneous Delivery in ROA position.  APGAR: 7, 9; weight  .   Placenta status: Intact, Spontaneous.  Cord: 3 vessels with the following complications: None.  Cord blood not needed.   Anesthesia: Epidural  Episiotomy: None Lacerations:  0 Suture Repair: none Est. Blood Loss (mL):    Mom to postpartum.  Baby to Couplet care / Skin to Skin.  Gloria [name], CARON W 08/16/2014, 12:53 AM     Baby: Doreatha MartinLiveborn female, Apgars 7/9, weight  Not done yet, baby named "Computer Sciences CorporationCharleigh Dove"

## 2014-08-01 NOTE — H&P (Signed)
L&D Evaluation:  History:  HPI 34 yo G5P4004 with LMP of 10/25/12 & EDd of 08/15/13  [facility] 34 weeks  followed by 3 term preg and deliveries. PP Hypertension HCTZ after 4th child. Considering PP BTL. Pt here this pm for UC's q 3-5 mins. No LOF, VB or decreased FM. GBS pos.   Presents with contractions   Patient's Medical History PP HTN   Patient's Surgical History none   Medications Pre Natal Vitamins   Allergies NKDA   Social History none   Family History Non-Contributory   ROS:  ROS All systems were reviewed.  HEENT, CNS, GI, GU, Respiratory, CV, Renal and Musculoskeletal systems were found to be normal.   Exam:  Vital Signs stable  133/94   General no apparent distress   Mental Status clear   Chest clear   Heart normal sinus rhythm, no murmur/gallop/rubs   Abdomen gravid, non-tender   Estimated Fetal Weight Average for gestational age   Fetal Position vtx   Back no CVAT   Reflexes 1+   Clonus negative   Pelvic 3/70/vtx ballotable   Mebranes Intact   FHT normal rate with no decels   Ucx regular, q 3-285mins, mild   Skin dry   Lymph no lymphadenopathy   Impression:  Impression IUP at 38 1/7 weeks   Plan:  Plan monitor contractions and for cervical change, Ambulate and re-check cx in 1 hour   Comments Pt is +GBS and needs antibiotics 4 hrs apart x  2 doses. if pt makes any change, will admit for Antibotics and delivery since all of her births have become very rapid. Due to 39 week IOL criteria, cannot assist labor process unless pt declares natural labor.   Electronic Signatures: Sharee PimpleJones, Caron W (CNM)  (Signed 10-May-15 21:15)  Authored: L&D Evaluation   Last Updated: 10-May-15 21:15 by Sharee PimpleJones, Caron W (CNM)

## 2014-08-03 LAB — OB RESULTS CONSOLE GC/CHLAMYDIA
Chlamydia: NEGATIVE
GC PROBE AMP, GENITAL: NEGATIVE

## 2014-08-03 LAB — OB RESULTS CONSOLE GBS: GBS: POSITIVE

## 2014-08-15 ENCOUNTER — Inpatient Hospital Stay
Admission: EM | Admit: 2014-08-15 | Discharge: 2014-08-17 | DRG: 775 | Disposition: A | Discharge status: Home / Self Care | Payer: BLUE CROSS/BLUE SHIELD | Attending: Obstetrics and Gynecology | Admitting: Obstetrics and Gynecology

## 2014-08-15 ENCOUNTER — Inpatient Hospital Stay: Payer: BLUE CROSS/BLUE SHIELD | Admitting: Anesthesiology

## 2014-08-15 ENCOUNTER — Encounter: Payer: Self-pay | Admitting: *Deleted

## 2014-08-15 DIAGNOSIS — Z3483 Encounter for supervision of other normal pregnancy, third trimester: Secondary | ICD-10-CM | POA: Diagnosis present

## 2014-08-15 DIAGNOSIS — Z3A38 38 weeks gestation of pregnancy: Secondary | ICD-10-CM | POA: Diagnosis present

## 2014-08-15 DIAGNOSIS — O99824 Streptococcus B carrier state complicating childbirth: Secondary | ICD-10-CM | POA: Diagnosis present

## 2014-08-15 DIAGNOSIS — Z22330 Carrier of Group B streptococcus: Secondary | ICD-10-CM

## 2014-08-15 HISTORY — DX: Unspecified abnormal cytological findings in specimens from vagina: R87.629

## 2014-08-15 HISTORY — DX: Gestational (pregnancy-induced) hypertension without significant proteinuria, unspecified trimester: O13.9

## 2014-08-15 LAB — CBC
HEMATOCRIT: 36.3 % (ref 35.0–47.0)
Hemoglobin: 12.3 g/dL (ref 12.0–16.0)
MCH: 30.2 pg (ref 26.0–34.0)
MCHC: 34 g/dL (ref 32.0–36.0)
MCV: 88.8 fL (ref 80.0–100.0)
PLATELETS: 172 10*3/uL (ref 150–440)
RBC: 4.08 MIL/uL (ref 3.80–5.20)
RDW: 13.3 % (ref 11.5–14.5)
WBC: 10.7 10*3/uL (ref 3.6–11.0)

## 2014-08-15 LAB — ABO/RH: ABO/RH(D): A POS

## 2014-08-15 MED ORDER — ONDANSETRON HCL 4 MG/2ML IJ SOLN: 4.0000 mg | Freq: Four times a day (QID) | INTRAMUSCULAR | Status: DC | PRN

## 2014-08-15 MED ORDER — AMMONIA AROMATIC IN INHA
RESPIRATORY_TRACT | Status: AC
  Filled 2014-08-15: qty 10

## 2014-08-15 MED ORDER — BUPIVACAINE HCL (PF) 0.25 % IJ SOLN
INTRAMUSCULAR | Status: DC | PRN
  Administered 2014-08-15: 5 mL

## 2014-08-15 MED ORDER — SODIUM CHLORIDE 0.9 % IV SOLN
INTRAVENOUS | Status: AC
  Administered 2014-08-15: 1 g via INTRAVENOUS
  Filled 2014-08-15: qty 1000

## 2014-08-15 MED ORDER — OXYCODONE-ACETAMINOPHEN 5-325 MG PO TABS: 2.0000 | ORAL_TABLET | ORAL | Status: DC | PRN

## 2014-08-15 MED ORDER — EPHEDRINE 5 MG/ML INJ: 10.0000 mg | INTRAVENOUS | Status: DC | PRN

## 2014-08-15 MED ORDER — LACTATED RINGERS IV SOLN: INTRAVENOUS | Status: DC

## 2014-08-15 MED ORDER — LACTATED RINGERS IV SOLN: 500.0000 mL | INTRAVENOUS | Status: DC | PRN

## 2014-08-15 MED ORDER — MISOPROSTOL 200 MCG PO TABS
ORAL_TABLET | ORAL | Status: AC
  Filled 2014-08-15: qty 4

## 2014-08-15 MED ORDER — LIDOCAINE-EPINEPHRINE (PF) 1.5 %-1:200000 IJ SOLN
INTRAMUSCULAR | Status: DC | PRN
  Administered 2014-08-15: 4 mL

## 2014-08-15 MED ORDER — OXYTOCIN 10 UNIT/ML IJ SOLN
INTRAMUSCULAR | Status: AC
  Filled 2014-08-15: qty 2

## 2014-08-15 MED ORDER — OXYTOCIN BOLUS FROM INFUSION
500.0000 mL | INTRAVENOUS | Status: DC
  Administered 2014-08-16: 500 mL via INTRAVENOUS

## 2014-08-15 MED ORDER — OXYTOCIN 40 UNITS IN LACTATED RINGERS INFUSION - SIMPLE MED: 62.5000 mL/h | INTRAVENOUS | Status: DC

## 2014-08-15 MED ORDER — FENTANYL 2.5 MCG/ML W/ROPIVACAINE 0.2% IN NS 100 ML EPIDURAL INFUSION (ARMC-ANES): 9.0000 mL/h | EPIDURAL | Status: DC

## 2014-08-15 MED ORDER — TERBUTALINE SULFATE 1 MG/ML IJ SOLN: 0.2500 mg | Freq: Once | INTRAMUSCULAR | Status: AC | PRN

## 2014-08-15 MED ORDER — LACTATED RINGERS IV SOLN
INTRAVENOUS | Status: DC
  Administered 2014-08-15: 17:00:00 via INTRAVENOUS

## 2014-08-15 MED ORDER — SODIUM CHLORIDE 0.9 % IV SOLN
2.0000 g | Freq: Once | INTRAVENOUS | Status: AC
  Administered 2014-08-15: 2 g via INTRAVENOUS

## 2014-08-15 MED ORDER — SODIUM CHLORIDE 0.9 % IV SOLN
1.0000 g | INTRAVENOUS | Status: DC
  Administered 2014-08-15: 1 g via INTRAVENOUS

## 2014-08-15 MED ORDER — BUPIVACAINE HCL (PF) 0.25 % IJ SOLN: INTRAMUSCULAR | Status: DC | PRN

## 2014-08-15 MED ORDER — CITRIC ACID-SODIUM CITRATE 334-500 MG/5ML PO SOLN: 30.0000 mL | ORAL | Status: DC | PRN

## 2014-08-15 MED ORDER — OXYCODONE-ACETAMINOPHEN 5-325 MG PO TABS: 1.0000 | ORAL_TABLET | ORAL | Status: DC | PRN

## 2014-08-15 MED ORDER — PHENYLEPHRINE 40 MCG/ML (10ML) SYRINGE FOR IV PUSH (FOR BLOOD PRESSURE SUPPORT): 80.0000 ug | PREFILLED_SYRINGE | INTRAVENOUS | Status: DC | PRN

## 2014-08-15 MED ORDER — DIPHENHYDRAMINE HCL 50 MG/ML IJ SOLN: 12.5000 mg | INTRAMUSCULAR | Status: DC | PRN

## 2014-08-15 MED ORDER — LIDOCAINE HCL (PF) 1 % IJ SOLN
INTRAMUSCULAR | Status: AC
  Filled 2014-08-15: qty 30

## 2014-08-15 MED ORDER — LIDOCAINE HCL (PF) 1 % IJ SOLN: 30.0000 mL | INTRAMUSCULAR | Status: DC | PRN

## 2014-08-15 MED ORDER — ACETAMINOPHEN 325 MG PO TABS
650.0000 mg | ORAL_TABLET | ORAL | Status: DC | PRN
  Administered 2014-08-16: 650 mg via ORAL

## 2014-08-15 MED ORDER — LACTATED RINGERS IV SOLN
500.0000 mL | INTRAVENOUS | Status: DC | PRN
  Administered 2014-08-15 (×2): 500 mL via INTRAVENOUS

## 2014-08-15 MED ORDER — OXYTOCIN 40 UNITS IN LACTATED RINGERS INFUSION - SIMPLE MED
62.5000 mL/h | INTRAVENOUS | Status: DC
  Administered 2014-08-16: 62.5 mL/h via INTRAVENOUS

## 2014-08-15 MED ORDER — OXYTOCIN 40 UNITS IN LACTATED RINGERS INFUSION - SIMPLE MED
1.0000 m[IU]/min | INTRAVENOUS | Status: DC
  Administered 2014-08-15: 1 m[IU]/min via INTRAVENOUS

## 2014-08-15 MED ORDER — OXYTOCIN BOLUS FROM INFUSION: 500.0000 mL | INTRAVENOUS | Status: DC

## 2014-08-15 MED ORDER — OXYTOCIN 40 UNITS IN LACTATED RINGERS INFUSION - SIMPLE MED
INTRAVENOUS | Status: AC
  Administered 2014-08-15: 1 m[IU]/min via INTRAVENOUS
  Filled 2014-08-15: qty 1000

## 2014-08-15 MED ORDER — SODIUM CHLORIDE 0.9 % IV SOLN
INTRAVENOUS | Status: AC
  Administered 2014-08-15: 2 g via INTRAVENOUS
  Filled 2014-08-15: qty 2000

## 2014-08-15 MED ORDER — FENTANYL 2.5 MCG/ML W/ROPIVACAINE 0.2% IN NS 100 ML EPIDURAL INFUSION (ARMC-ANES)
EPIDURAL | Status: AC
  Administered 2014-08-15: 9 mL/h via EPIDURAL
  Filled 2014-08-15: qty 100

## 2014-08-15 NOTE — Plan of Care (Signed)
Problem: Consults Goal: Birthing Suites Patient Information Press F2 to bring up selections list Outcome: Progressing  Pt 37-[redacted] weeks EGA     

## 2014-08-15 NOTE — Evaluation (Signed)
Epidural level now below breasts.  Patient states she feels better.

## 2014-08-15 NOTE — Significant Event (Signed)
Patient complaining of not being able to take a deep breath or cough.  Epidural level assessed.  Level was T10 initially after placement.  Level now even with nipple line.  Epidural discontinued.  Patient placed in high fowlers position.  Anesthesia notified.  Telephone order to decrease epidural rate to 1 cc/hr until level at T6 then start again at a rate of 7 cc/hr

## 2014-08-15 NOTE — H&P (Signed)
Gloria [name] is a 34 y.o. G6P0 at 2081w2d by LMP  Of 08/12/14 admitted for active labor with +GBS needing 2 doses of Ampicillin per protocol. Pt began to hurt today with UC's and arrived in early labor with cx exam of 4/ 60/vtx-2. No ROM. No VB. Baby very active. Girl Gloria [name]. PNC significant for Elevated BP in 3rd trimester, no meds needed. Formerly, had Pre-Ex pp 1 weeks after delivery.  O+, AB screen neg, Pap neg, HIV/Hep B neg. Plans PP BTL vs vasectomty.  Subjective: Hurting mod with UC's.  Objective: Ht 5\' 3"  (1.6 m)  Wt 85.73 kg (189 lb)  BMI 33.49 kg/m2 I/O last 3 completed shifts: In: 154.2 [I.V.:104.2; IV Piggyback:50] Out: -     FHT:  FHR: + accels, mod variability, no decels bpm, variability: moderate,  accelerations:  Present,  decelerations:  Absent UC:  q 10 mins spacing out, formerly q 3-8 mins. SVE:   Dilation: 4 Effacement (%): 60 Exam by:: jj coble,RN  Labs: Lab Results  Component Value Date   WBC 10.7 08/15/2014   HGB 12.3 08/15/2014   HCT 36.3 08/15/2014   MCV 88.8 08/15/2014   PLT 172 08/15/2014    Assessment / Plan: Spontaneous labor, progressing normally  Labor: Progressing normally Preeclampsia:  no BP issues Fetal Wellbeing:  Category I Pain Control:  Epidural planned I/D:  n/a Anticipated MOD:  NSVD  Gloria [name], Gloria [name] 08/15/2014, 7:19 PM

## 2014-08-15 NOTE — Anesthesia Procedure Notes (Addendum)
Epidural Patient location during procedure: OB  Staffing Performed by: anesthesiologist   Preanesthetic Checklist Completed: patient identified, site marked, surgical consent, pre-op evaluation, timeout performed, IV checked, risks and benefits discussed and monitors and equipment checked  Epidural Patient position: sitting Prep: Betadine Patient monitoring: heart rate, continuous pulse ox and blood pressure Approach: midline Location: L3-L4 Injection technique: LOR saline  Needle:  Needle type: Tuohy  Needle gauge: 18 G Needle length: 9 cm and 9 Needle insertion depth: 5 cm Catheter type: closed end flexible Catheter size: 20 Guage Test dose: negative and 1.5% lidocaine with Epi 1:200 K  Assessment Sensory level: T10 Events: blood not aspirated, injection not painful, no injection resistance, negative IV test and no paresthesia  Additional Notes   Patient tolerated the insertion well without complications.-SATD -IVTD. No paresthesia. [facility] for VS and dosingReason for block:procedure for pain

## 2014-08-15 NOTE — Progress Notes (Signed)
Patient ID: Gloria [name], female   DOB: December 22, 1980, 34 y.o.   MRN: [id number]  Lungs CTA bilat, neg W/R/R. Heart: S1S2, RRR, No M/R/G. AROM at 2058 for mod clear fluid. Pt is 5/90/vtx-2 UC: irreg FHT: mod vaiability, 150, no decels. A: IUP at term GB + P: 1. Antic SVD 2. 2nnd dose of Antibiotics infusing 3. Epidural for pain control 4. Antic SVD

## 2014-08-15 NOTE — OB Triage Note (Signed)
Patient states she has been hurting since this morning contracting every 5 to7 minutes

## 2014-08-15 NOTE — Anesthesia Preprocedure Evaluation (Signed)
Anesthesia Evaluation  Patient identified by MRN, date of birth, ID band Patient awake    Reviewed: Allergy & Precautions, H&P , NPO status , Patient's Chart, lab work & pertinent test results, reviewed documented beta blocker date and time   Airway Mallampati: II  TM Distance: >3 FB Neck ROM: full    Dental no notable dental hx. (+) Teeth Intact   Pulmonary neg pulmonary ROS, former smoker,  breath sounds clear to auscultation  Pulmonary exam normal       Cardiovascular Exercise Tolerance: Good hypertension, negative cardio ROS  Rhythm:regular Rate:Normal     Neuro/Psych negative neurological ROS  negative psych ROS   GI/Hepatic negative GI ROS, Neg liver ROS,   Endo/Other  negative endocrine ROS  Renal/GU negative Renal ROS  negative genitourinary   Musculoskeletal   Abdominal   Peds  Hematology negative hematology ROS (+)   Anesthesia Other Findings   Reproductive/Obstetrics (+) Pregnancy                             Anesthesia Physical Anesthesia Plan  ASA: II  Anesthesia Plan: Regional and Epidural   Post-op Pain Management:    Induction:   Airway Management Planned:   Additional Equipment:   Intra-op Plan:   Post-operative Plan:   Informed Consent: I have reviewed the patients History and Physical, chart, labs and discussed the procedure including the risks, benefits and alternatives for the proposed anesthesia with the patient or authorized representative who has indicated his/her understanding and acceptance.     Plan Discussed with:   Anesthesia Plan Comments:         Anesthesia Quick Evaluation

## 2014-08-15 NOTE — Evaluation (Signed)
Epidural level now at T8.  Epidural infusion restarted at 7 cc/hr per anesthesia order

## 2014-08-16 LAB — CBC
HEMATOCRIT: 34.2 % — AB (ref 35.0–47.0)
Hemoglobin: 11.8 g/dL — ABNORMAL LOW (ref 12.0–16.0)
MCH: 30.6 pg (ref 26.0–34.0)
MCHC: 34.5 g/dL (ref 32.0–36.0)
MCV: 88.8 fL (ref 80.0–100.0)
PLATELETS: 155 10*3/uL (ref 150–440)
RBC: 3.85 MIL/uL (ref 3.80–5.20)
RDW: 13.3 % (ref 11.5–14.5)
WBC: 13.5 10*3/uL — ABNORMAL HIGH (ref 3.6–11.0)

## 2014-08-16 LAB — TYPE AND SCREEN
ABO/RH(D): A POS
ANTIBODY SCREEN: NEGATIVE

## 2014-08-16 LAB — RPR: RPR Ser Ql: NONREACTIVE

## 2014-08-16 MED ORDER — IBUPROFEN 600 MG PO TABS
600.0000 mg | ORAL_TABLET | Freq: Four times a day (QID) | ORAL | Status: DC
  Administered 2014-08-16 – 2014-08-17 (×5): 600 mg via ORAL
  Filled 2014-08-16 (×5): qty 1

## 2014-08-16 MED ORDER — BISACODYL 10 MG RE SUPP: 10.0000 mg | Freq: Every day | RECTAL | Status: DC | PRN

## 2014-08-16 MED ORDER — ONDANSETRON HCL 4 MG/2ML IJ SOLN: 4.0000 mg | INTRAMUSCULAR | Status: DC | PRN

## 2014-08-16 MED ORDER — OXYTOCIN 40 UNITS IN LACTATED RINGERS INFUSION - SIMPLE MED: 62.5000 mL/h | INTRAVENOUS | Status: DC | PRN

## 2014-08-16 MED ORDER — TETANUS-DIPHTH-ACELL PERTUSSIS 5-2.5-18.5 LF-MCG/0.5 IM SUSP: 0.5000 mL | Freq: Once | INTRAMUSCULAR | Status: DC

## 2014-08-16 MED ORDER — DIBUCAINE 1 % RE OINT: 1.0000 "application " | TOPICAL_OINTMENT | RECTAL | Status: DC | PRN

## 2014-08-16 MED ORDER — OXYCODONE-ACETAMINOPHEN 5-325 MG PO TABS: 1.0000 | ORAL_TABLET | ORAL | Status: DC | PRN

## 2014-08-16 MED ORDER — ACETAMINOPHEN 325 MG PO TABS: 650.0000 mg | ORAL_TABLET | ORAL | Status: DC | PRN

## 2014-08-16 MED ORDER — OXYCODONE-ACETAMINOPHEN 5-325 MG PO TABS: 2.0000 | ORAL_TABLET | ORAL | Status: DC | PRN

## 2014-08-16 MED ORDER — SENNOSIDES-DOCUSATE SODIUM 8.6-50 MG PO TABS
2.0000 | ORAL_TABLET | ORAL | Status: DC
  Administered 2014-08-17: 2 via ORAL
  Filled 2014-08-16: qty 2

## 2014-08-16 MED ORDER — BENZOCAINE-MENTHOL 20-0.5 % EX AERO
1.0000 "application " | INHALATION_SPRAY | CUTANEOUS | Status: DC | PRN
  Administered 2014-08-16: 1 via TOPICAL
  Filled 2014-08-16: qty 56

## 2014-08-16 MED ORDER — PRENATAL MULTIVITAMIN CH
1.0000 | ORAL_TABLET | Freq: Every day | ORAL | Status: DC
  Administered 2014-08-16: 1 via ORAL
  Filled 2014-08-16: qty 1

## 2014-08-16 MED ORDER — WITCH HAZEL-GLYCERIN EX PADS: 1.0000 "application " | MEDICATED_PAD | CUTANEOUS | Status: DC | PRN

## 2014-08-16 MED ORDER — ONDANSETRON HCL 4 MG PO TABS: 4.0000 mg | ORAL_TABLET | ORAL | Status: DC | PRN

## 2014-08-16 MED ORDER — ZOLPIDEM TARTRATE 5 MG PO TABS: 5.0000 mg | ORAL_TABLET | Freq: Every evening | ORAL | Status: DC | PRN

## 2014-08-16 MED ORDER — MEASLES, MUMPS & RUBELLA VAC ~~LOC~~ INJ
0.5000 mL | INJECTION | Freq: Once | SUBCUTANEOUS | Status: DC
  Filled 2014-08-16: qty 0.5

## 2014-08-16 MED ORDER — FLEET ENEMA 7-19 GM/118ML RE ENEM: 1.0000 | ENEMA | Freq: Every day | RECTAL | Status: DC | PRN

## 2014-08-16 MED ORDER — LANOLIN HYDROUS EX OINT: TOPICAL_OINTMENT | CUTANEOUS | Status: DC | PRN

## 2014-08-16 MED ORDER — OXYTOCIN 40 UNITS IN LACTATED RINGERS INFUSION - SIMPLE MED
INTRAVENOUS | Status: AC
Start: 2014-08-16 — End: 2014-08-16
  Administered 2014-08-16: 62.5 mL/h via INTRAVENOUS
  Filled 2014-08-16: qty 1000

## 2014-08-16 MED ORDER — DIPHENHYDRAMINE HCL 25 MG PO CAPS: 25.0000 mg | ORAL_CAPSULE | Freq: Four times a day (QID) | ORAL | Status: DC | PRN

## 2014-08-16 MED ORDER — SODIUM CHLORIDE 0.9 % IV SOLN: 250.0000 mL | INTRAVENOUS | Status: DC | PRN

## 2014-08-16 MED ORDER — SIMETHICONE 80 MG PO CHEW: 80.0000 mg | CHEWABLE_TABLET | ORAL | Status: DC | PRN

## 2014-08-16 MED ORDER — ACETAMINOPHEN 325 MG PO TABS
ORAL_TABLET | ORAL | Status: AC
  Administered 2014-08-16: 650 mg via ORAL
  Filled 2014-08-16: qty 2

## 2014-08-16 NOTE — Progress Notes (Signed)
Post Partum Day DOD Subjective: no complaints  Objective: Blood pressure 127/69, pulse 88, temperature 98.2 F (36.8 C), temperature source Oral, resp. rate 18, height 5\' 3"  (1.6 m), weight 85.73 kg (189 lb), SpO2 98 %, unknown if currently breastfeeding.  Physical Exam:  General: alert and cooperative Lochia: appropriate Uterine Fundus: firm Incision:  DVT Evaluation: No evidence of DVT seen on physical exam.   Recent Labs  08/15/14 1719 08/16/14 0516  HGB 12.3 11.8*  HCT 36.3 34.2*    Assessment/Plan: Breastfeeding  D/c iv   LOS: 1 day   SCHERMERHORN,THOMAS 08/16/2014, 8:34 AM

## 2014-08-17 ENCOUNTER — Encounter: Payer: Self-pay | Admitting: Anesthesiology

## 2014-08-17 NOTE — Discharge Summary (Signed)
Obstetric Discharge Summary Reason for Admission: onset of labor Prenatal Procedures: ultrasound Intrapartum Procedures: spontaneous vaginal delivery Postpartum Procedures: none Complications-Operative and Postpartum: none HEMOGLOBIN  Date Value Ref Range Status  08/16/2014 11.8* 12.0 - 16.0 g/dL Final   HGB  Date Value Ref Range Status  08/08/2013 13.6 12.0-16.0 g/dL Final   HCT  Date Value Ref Range Status  08/16/2014 34.2* 35.0 - 47.0 % Final  08/10/2013 37.0 35.0-47.0 % Final    Physical Exam:  General: alert Lochia: appropriate Uterine Fundus: firm Incision: none DVT Evaluation: No evidence of DVT seen on physical exam.  Discharge Diagnoses: Term Pregnancy-delivered  Discharge Information: Date: 08/17/2014 Activity: unrestricted Diet: regular Medications: Ibuprofen Condition: stable Instructions: refer to practice specific booklet and no driving x 2 weeks, no sex x 6 weeks Discharge ZO:XWRUto:home   Newborn Data: Live born female  Birth Weight: 7 lb 2.6 oz (3250 g) APGAR: 7, 9  Home with mother and father.  Milon ScoreJONES, Tor Tsuda W 08/17/2014, 8:38 AM

## 2014-08-17 NOTE — Discharge Instructions (Signed)
Postpartum Care After Vaginal Delivery °After you deliver your newborn (postpartum period), the usual stay in the hospital is 24-72 hours. If there were problems with your labor or delivery, or if you have other medical problems, you might be in the hospital longer.  °While you are in the hospital, you will receive help and instructions on how to care for yourself and your newborn during the postpartum period.  °While you are in the hospital: °· Be sure to tell your nurses if you have pain or discomfort, as well as where you feel the pain and what makes the pain worse. °· If you had an incision made near your vagina (episiotomy) or if you had some tearing during delivery, the nurses may put ice packs on your episiotomy or tear. The ice packs may help to reduce the pain and swelling. °· If you are breastfeeding, you may feel uncomfortable contractions of your uterus for a couple of weeks. This is normal. The contractions help your uterus get back to normal size. °· It is normal to have some bleeding after delivery. °¨ For the first 1-3 days after delivery, the flow is red and the amount may be similar to a period. °¨ It is common for the flow to start and stop. °¨ In the first few days, you may pass some small clots. Let your nurses know if you begin to pass large clots or your flow increases. °¨ Do not  flush blood clots down the toilet before having the nurse look at them. °¨ During the next 3-10 days after delivery, your flow should become more watery and pink or brown-tinged in color. °¨ Ten to fourteen days after delivery, your flow should be a small amount of yellowish-white discharge. °¨ The amount of your flow will decrease over the first few weeks after delivery. Your flow may stop in 6-8 weeks. Most women have had their flow stop by 12 weeks after delivery. °· You should change your sanitary pads frequently. °· Wash your hands thoroughly with soap and water for at least 20 seconds after changing pads, using  the toilet, or before holding or feeding your newborn. °· You should feel like you need to empty your bladder within the first 6-8 hours after delivery. °· In case you become weak, lightheaded, or faint, call your nurse before you get out of bed for the first time and before you take a shower for the first time. °· Within the first few days after delivery, your breasts may begin to feel tender and full. This is called engorgement. Breast tenderness usually goes away within 48-72 hours after engorgement occurs. You may also notice milk leaking from your breasts. If you are not breastfeeding, do not stimulate your breasts. Breast stimulation can make your breasts produce more milk. °· Spending as much time as possible with your newborn is very important. During this time, you and your newborn can feel close and get to know each other. Having your newborn stay in your room (rooming in) will help to strengthen the bond with your newborn.  It will give you time to get to know your newborn and become comfortable caring for your newborn. °· Your hormones change after delivery. Sometimes the hormone changes can temporarily cause you to feel sad or tearful. These feelings should not last more than a few days. If these feelings last longer than that, you should talk to your caregiver. °· If desired, talk to your caregiver about methods of family planning or contraception. °·   Talk to your caregiver about immunizations. Your caregiver may want you to have the following immunizations before leaving the hospital:  Tetanus, diphtheria, and pertussis (Tdap) or tetanus and diphtheria (Td) immunization. It is very important that you and your family (including grandparents) or others caring for your newborn are up-to-date with the Tdap or Td immunizations. The Tdap or Td immunization can help protect your newborn from getting ill.  Rubella immunization.  Varicella (chickenpox) immunization.  Influenza immunization. You should  receive this annual immunization if you did not receive the immunization during your pregnancy. Document Released: 01/05/2007 Document Revised: 12/03/2011 Document Reviewed: 11/05/2011 [facility] Patient Information 2015 [employer] [place]. This information is not intended to replace advice given to you by your health care provider. Make sure you discuss any questions you have with your health care provider. Please call to schedule a 6 week post partum check up with your prenatal care providerPlease call your doctor or return to the ER if you experience any chest pains, shortness of breath, fever greater than 101, any heavy bleeding or large clots, and foul smelling vaginal discharge, any worsening abdominal pain & cramping that is not controlled by pain medication, or any signs of post partum depression.  No tampons, enemas, douches, or sexual intercourse for 6 weeks.  Also avoid tub baths, hot tubs, or swimming for 6 weeks.

## 2014-08-17 NOTE — Progress Notes (Signed)
D/C order from MD.  Reviewed d/c instructions and prescriptions with patient and answered any questions.  Patient d/c home with infant via wheelchair by nursing/auxillary. 

## 2014-08-17 NOTE — Anesthesia Postprocedure Evaluation (Deleted)
  Anesthesia Post-op Note  Patient: Gloria [name]  Procedure(s) Performed: labor epidural  Anesthesia type:epidural  Patient location: 337-A  Post pain: Pain level controlled  Post assessment: Post-op Vital signs reviewed, Patient's Cardiovascular Status Stable, Respiratory Function Stable, Patent Airway and No signs of Nausea or vomiting  Post vital signs: Reviewed and stable  Last Vitals:  Filed Vitals:   08/17/14 0712  BP: 103/57  Pulse: 81  Temp: 36.7 C  Resp: 18    Level of consciousness: awake, alert  and patient cooperative  Complications: No apparent anesthesia complications

## 2014-08-17 NOTE — Progress Notes (Signed)
Post Partum Day 2 Subjective: no complaints  Objective: Blood pressure 103/57, pulse 81, temperature 98.1 F (36.7 C), temperature source Oral, resp. rate 18, height 5\' 3"  (1.6 m), weight 85.73 kg (189 lb), SpO2 99 %, unknown if currently breastfeeding.  Physical Exam:  General: alert Lochia: appropriate Uterine Fundus: firm Incision: none DVT Evaluation: No evidence of DVT seen on physical exam.   Recent Labs  08/15/14 1719 08/16/14 0516  HGB 12.3 11.8*  HCT 36.3 34.2*    Assessment/Plan: Discharge home   LOS: 2 days   Sharee PimpleJONES, Tamzin Bertling W 08/17/2014, 8:42 AM

## 2014-08-17 NOTE — Anesthesia Postprocedure Evaluation (Signed)
  Anesthesia Post-op Note  Patient: Gloria [name]  Procedure(s) Performed: * No procedures listed *  Anesthesia type:Regional, Epidural  Patient location: PACU  Post pain: Pain level controlled  Post assessment: Post-op Vital signs reviewed, Patient's Cardiovascular Status Stable, Respiratory Function Stable, Patent Airway and No signs of Nausea or vomiting  Post vital signs: Reviewed and stable  Last Vitals:  Filed Vitals:   08/17/14 0712  BP: 103/57  Pulse: 81  Temp: 36.7 C  Resp: 18    Level of consciousness: awake, alert  and patient cooperative  Complications: No apparent anesthesia complications

## 2016-12-26 IMAGING — US US MFM OB COMPLETE +14 WKS
1 series · 13 of 28 positions shown · non-contrast
Comparison: none

PATIENT INFO:

PERFORMED BY:
SERVICE(S) PROVIDED:
INDICATIONS:
13 weeks gestation of pregnancy
AMA
Subchorionic hemorrhage
FETAL EVALUATION:
Num Of Fetuses:     1
Fetal Heart         162
Rate(bpm):
Presentation:       Transverse, head to maternal right
Placenta:           Posterior
P. Cord Insertion:  Normal
Amniotic Fluid
AFI FV:      Normal
BIOMETRY:
CRL:      67.2  mm     G. Age:  13w 0d                  EDD:   [DATE]
GESTATIONAL AGE:
LMP:           15w 3d        Date:  [DATE]                 EDD:   [DATE]
Best:          13w 0d     Det. By:  Early Ultrasound         EDD:   [DATE]
([DATE])
ANATOMY:
Cranium:               Within Normal Limits   Bladder:                Seen
Choroid Plexus:        Within normal limits   Upper Extremities:      Visualized
for gestational age
Stomach:               Seen                   Lower Extremities:      Visualized
Abdominal Wall:        Within normal limits
CERVIX UTERUS ADNEXA:
Cervix
Length:            4.3  cm.

[Series 1: us mfm ob complete +14 wks · 0.19mm/px · 13 of 58 slices shown]
[im 3/58]
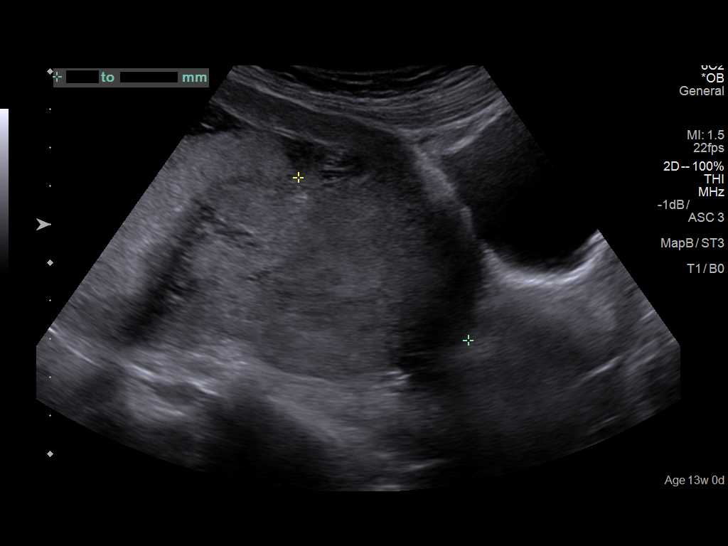
[im 7/58]
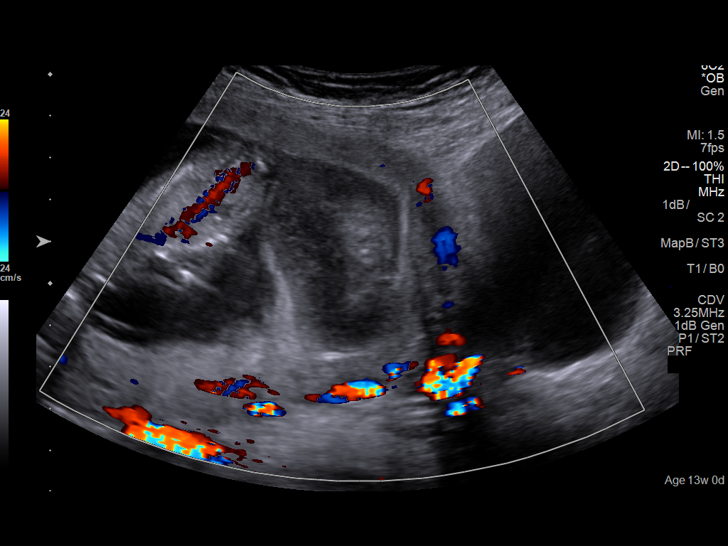
[im 11/58]
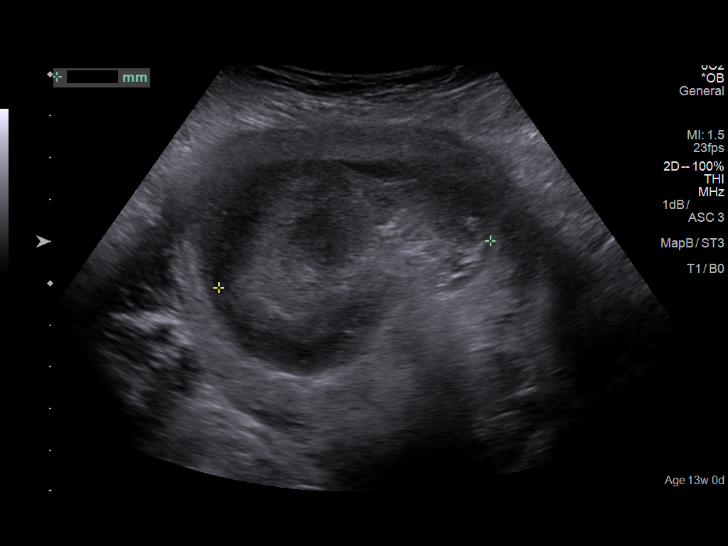
[im 15/58]
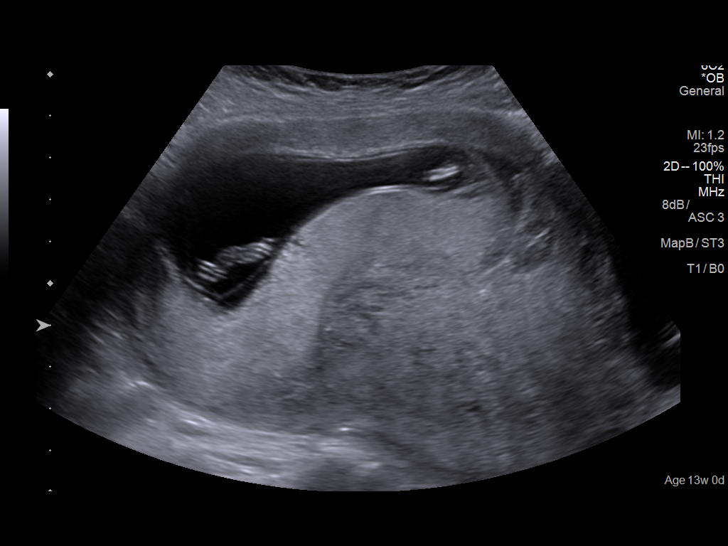
[im 20/58]
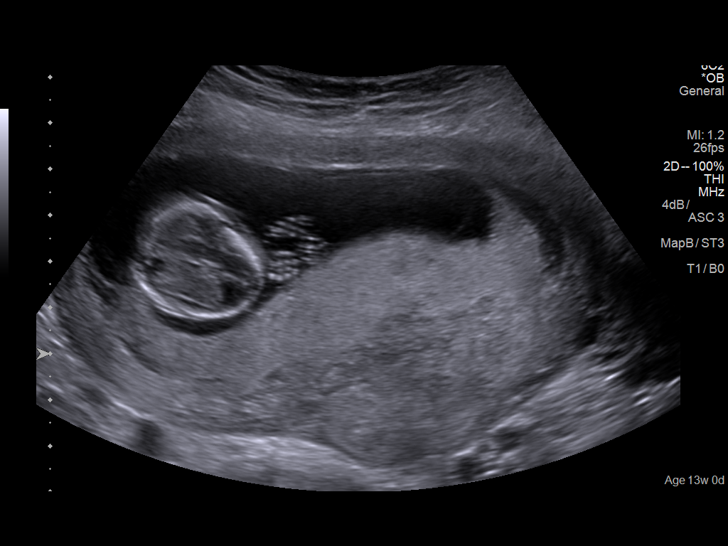
[im 24/58]
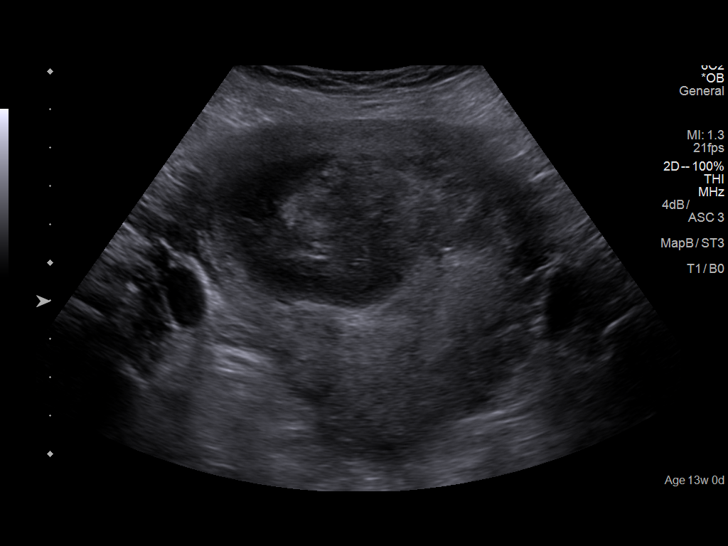
[im 30/58]
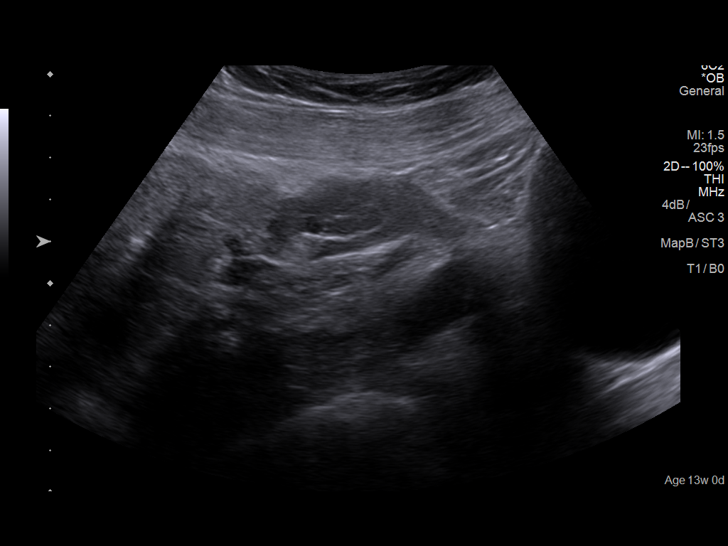
[im 34/58]
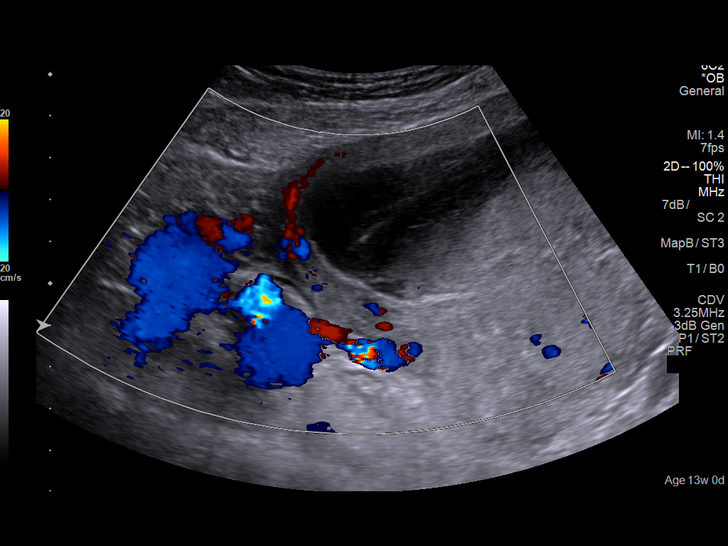
[im 39/58]
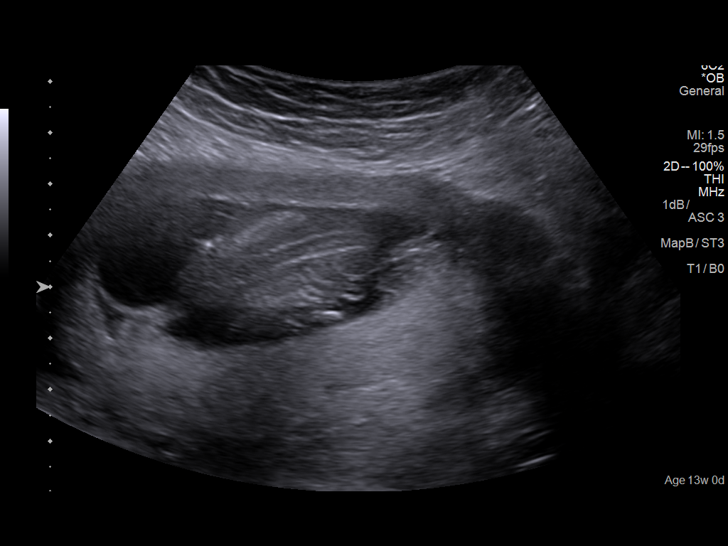
[im 43/58]
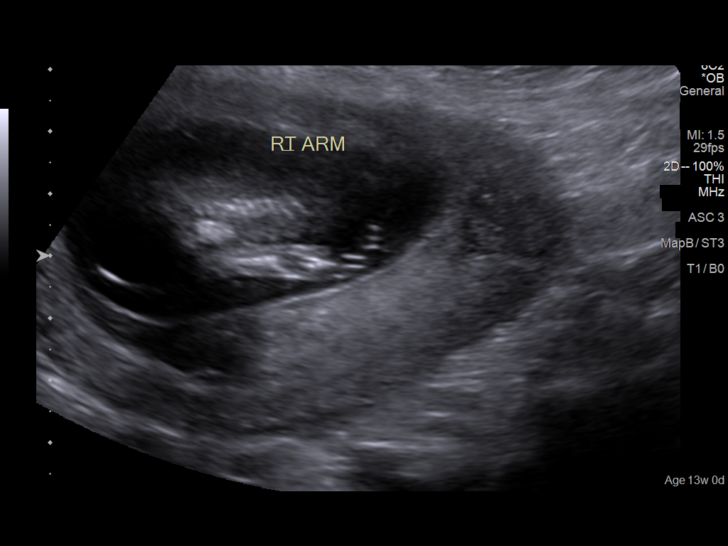
[im 47/58]
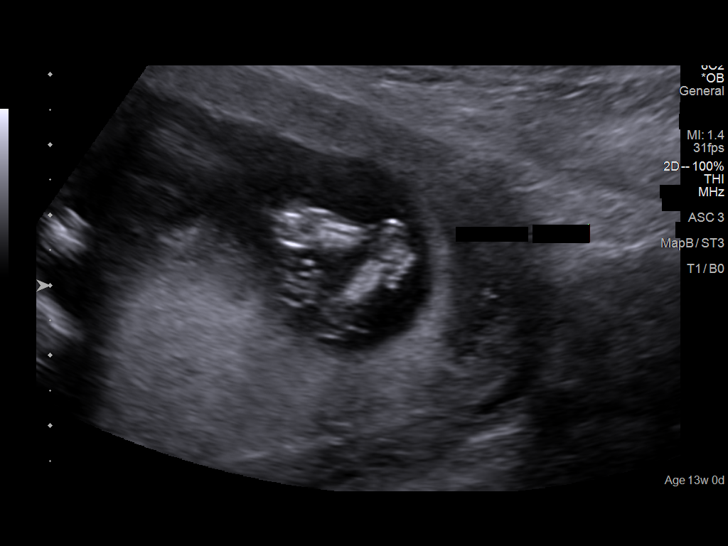
[im 51/58]
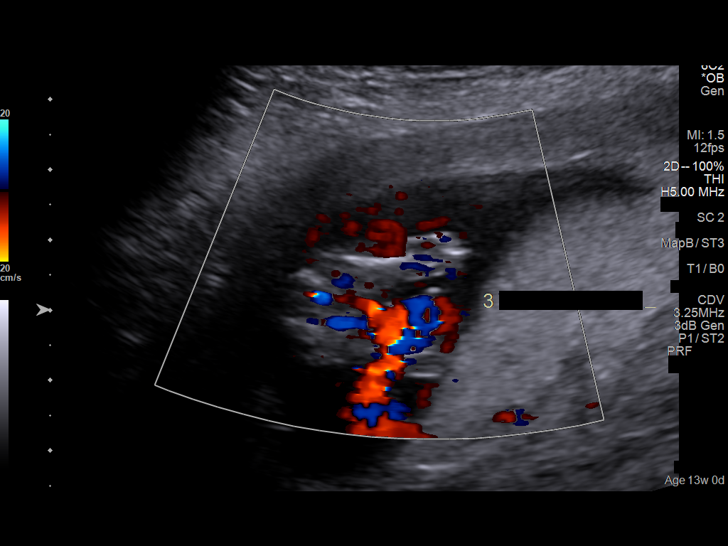
[im 55/58]
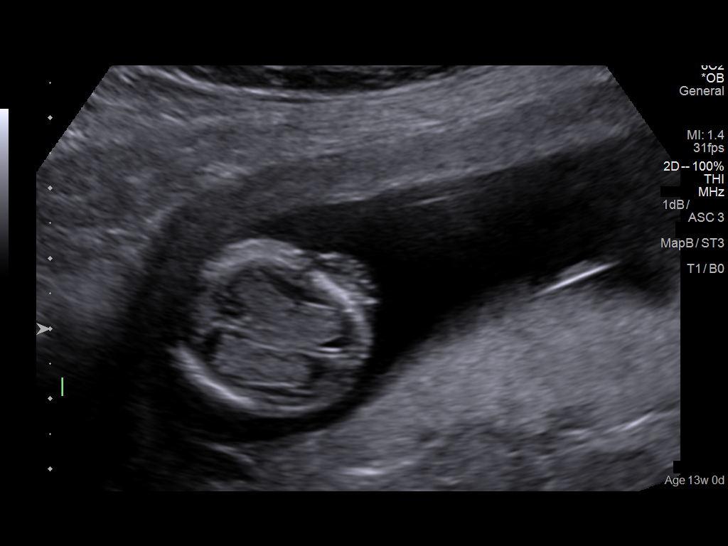

[13 of 28 positions shown; findings below may reference images not displayed]

IMPRESSION: Ultrasound demonstrates a single live pregnancy at 13 0/7
weeks.  Dating is by earliest available ultrasound performed
at [REDACTED] on [DATE] with measurements of 9 [DATE]
weeks.

Visualization of fetal anatomy is limited by early gestational
age.  The nuchal translucency region has a normal
appearance.  The amniotic fluid volume is normal. There is a
subchorionic hemorrhage visualized in the anterior lower
region of the uterus that measures 4.8 x 4.8 x 6.6 cm. There
is no internal flow seen.  The ovaries were not able to be
visualized.

The findings were discussed.  Ms. LAINA is on pelvic rest.
She completed genetic counseling and elected for cell-free
fetal DNA testing, which was sent.

## 2017-03-24 NOTE — L&D Delivery Note (Signed)
Date of delivery: 01/31/18 Estimated Date of Delivery: 03/15/18 Patient's last menstrual period was 06/08/2017. EGA: [redacted]w[redacted]d  Delivery Note At 4:27 PM a viable female was delivered via Vaginal, Spontaneous (Presentation: direct OA; cephalic).  APGAR: 8, 9; weight 4 lb 15.7 oz (2260 g).   Placenta status: sponatneous, intact, sent to pathology.  Cord: 3vv, with the following complications: none apparent.  Cord pH: not collected  Anesthesia:  none Episiotomy: None Lacerations:  none Suture Repair: n/a Est. Blood Loss (mL): 120cc (measured)  Mom presented to L&D with preterm contractions, given procardia and terbutaline, and discharged after a period of observation with no contractions.  Returned soon after with more frequent contractions.  Again given terbutaline but contractions persisted, then she declined further intervention.  Rapidly progressed to complete, second stage: one push delivery of fetal head and body without difficulty.  Baby placed on mom's chest, and attended to by peds. Cord was then clamped after one minute, and cut by mom. Placenta spontaneously delivered, intact.   IV pitocin given for hemorrhage prophylaxis.    We sang happy birthday to baby boy who has not yet been named.  Mom to postpartum.  Baby to Couplet care / Skin to Skin.  Glorianna Gott C Jaiyanna Safran 01/31/2018, 6:54 PM  It should be noted that the father of the baby was vocal about his displeasure with our team regarding his wife's pain control and our attention to her.  He was then vocal about not trusting our care, or the infant's care in the NICU upon recommendation he be transferred there for respiratory support.  He did not inhibit the transfer, but was adamant that when he and his wife left the hospital, he would be taking his infant home with him.  I talked to him about flexibility, in that if the breathing and feeding status were not great, that he weigh risk/benefit of taking infant home vs keeping for stability.  He  said he would weigh the pros and cons.

## 2017-08-05 ENCOUNTER — Other Ambulatory Visit: Payer: Self-pay | Admitting: Advanced Practice Midwife

## 2017-08-05 DIAGNOSIS — Z369 Encounter for antenatal screening, unspecified: Secondary | ICD-10-CM

## 2017-08-13 LAB — OB RESULTS CONSOLE RPR: RPR: NONREACTIVE

## 2017-08-13 LAB — OB RESULTS CONSOLE RUBELLA ANTIBODY, IGM: Rubella: IMMUNE

## 2017-08-13 LAB — OB RESULTS CONSOLE HIV ANTIBODY (ROUTINE TESTING): HIV: NONREACTIVE

## 2017-08-13 LAB — OB RESULTS CONSOLE HEPATITIS B SURFACE ANTIGEN: Hepatitis B Surface Ag: NEGATIVE

## 2017-08-13 LAB — OB RESULTS CONSOLE VARICELLA ZOSTER ANTIBODY, IGG: Varicella: IMMUNE

## 2017-08-24 ENCOUNTER — Ambulatory Visit: Payer: BLUE CROSS/BLUE SHIELD

## 2017-09-07 ENCOUNTER — Ambulatory Visit
Admission: RE | Admit: 2017-09-07 | Discharge: 2017-09-07 | Disposition: A | Discharge status: Home / Self Care | Payer: Medicaid Other | Source: Ambulatory Visit | Attending: Obstetrics & Gynecology | Admitting: Obstetrics & Gynecology

## 2017-09-07 ENCOUNTER — Other Ambulatory Visit: Payer: Self-pay | Admitting: *Deleted

## 2017-09-07 DIAGNOSIS — Z3481 Encounter for supervision of other normal pregnancy, first trimester: Secondary | ICD-10-CM | POA: Insufficient documentation

## 2017-09-07 DIAGNOSIS — Z3A13 13 weeks gestation of pregnancy: Secondary | ICD-10-CM | POA: Insufficient documentation

## 2017-09-07 DIAGNOSIS — Z369 Encounter for antenatal screening, unspecified: Secondary | ICD-10-CM

## 2017-09-07 DIAGNOSIS — O09521 Supervision of elderly multigravida, first trimester: Secondary | ICD-10-CM

## 2017-09-12 LAB — MATERNIT21 PLUS CORE+SCA
CHROMOSOME 13: NEGATIVE
CHROMOSOME 18: NEGATIVE
CHROMOSOME 21: NEGATIVE
Y Chromosome: DETECTED

## 2017-09-14 ENCOUNTER — Telehealth: Payer: Self-pay | Admitting: Genetics

## 2017-09-14 NOTE — Telephone Encounter (Signed)
The patient was informed of the results of her recent MaterniT21 testing which yielded NEGATIVE results. 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 The sensitivity for trisomy 8221, trisomy 3518 and trisomy 4013 using this testing are reported as 99.1%, 99.9% and 91.7% respectively. Thus, while the results of this testing are highly accurate, they are not considered diagnostic at this time. Should more definitive information be desired, the patient may still consider amniocentesis.   As requested to know by the patient, sex chromosome analysis was included for this sample. 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 This is predicted with >99% accuracy.  A maternal serum AFP only should be considered if screening for neural tube defects is desired.  We may be reached at [phone] with any questions or concerns.   Rosalva FerronMichelle Edgerrin Correia, MS, CGC

## 2018-01-22 ENCOUNTER — Observation Stay
Admission: EM | Admit: 2018-01-22 | Discharge: 2018-01-22 | Disposition: A | Discharge status: Home / Self Care | Payer: Medicaid Other | Attending: Obstetrics and Gynecology | Admitting: Obstetrics and Gynecology

## 2018-01-22 ENCOUNTER — Other Ambulatory Visit: Payer: Self-pay

## 2018-01-22 DIAGNOSIS — Z3A32 32 weeks gestation of pregnancy: Secondary | ICD-10-CM | POA: Diagnosis not present

## 2018-01-22 DIAGNOSIS — F17200 Nicotine dependence, unspecified, uncomplicated: Secondary | ICD-10-CM | POA: Insufficient documentation

## 2018-01-22 DIAGNOSIS — O99333 Smoking (tobacco) complicating pregnancy, third trimester: Secondary | ICD-10-CM | POA: Insufficient documentation

## 2018-01-22 LAB — URINALYSIS, ROUTINE W REFLEX MICROSCOPIC
BACTERIA UA: NONE SEEN
Bilirubin Urine: NEGATIVE
Glucose, UA: NEGATIVE mg/dL
KETONES UR: NEGATIVE mg/dL
LEUKOCYTES UA: NEGATIVE
Nitrite: NEGATIVE
PROTEIN: NEGATIVE mg/dL
Specific Gravity, Urine: 1.014 (ref 1.005–1.030)
pH: 8 (ref 5.0–8.0)

## 2018-01-22 LAB — WET PREP, GENITAL
Clue Cells Wet Prep HPF POC: NONE SEEN
Trich, Wet Prep: NONE SEEN
Yeast Wet Prep HPF POC: NONE SEEN

## 2018-01-22 LAB — CBC
HCT: 33.6 % — ABNORMAL LOW (ref 36.0–46.0)
HEMOGLOBIN: 11.5 g/dL — AB (ref 12.0–15.0)
MCH: 30.8 pg (ref 26.0–34.0)
MCHC: 34.2 g/dL (ref 30.0–36.0)
MCV: 90.1 fL (ref 80.0–100.0)
NRBC: 0 % (ref 0.0–0.2)
Platelets: 221 10*3/uL (ref 150–400)
RBC: 3.73 MIL/uL — AB (ref 3.87–5.11)
RDW: 13.2 % (ref 11.5–15.5)
WBC: 11.9 10*3/uL — ABNORMAL HIGH (ref 4.0–10.5)

## 2018-01-22 LAB — OB RESULTS CONSOLE GBS: STREP GROUP B AG: POSITIVE

## 2018-01-22 LAB — TYPE AND SCREEN
ABO/RH(D): A POS
Antibody Screen: NEGATIVE

## 2018-01-22 LAB — GROUP B STREP BY PCR: GROUP B STREP BY PCR: POSITIVE — AB

## 2018-01-22 LAB — CHLAMYDIA/NGC RT PCR (ARMC ONLY)
CHLAMYDIA TR: NOT DETECTED
N GONORRHOEAE: NOT DETECTED

## 2018-01-22 LAB — OB RESULTS CONSOLE GC/CHLAMYDIA
CHLAMYDIA, DNA PROBE: NEGATIVE
Gonorrhea: NEGATIVE

## 2018-01-22 MED ORDER — PENICILLIN G 3 MILLION UNITS IVPB - SIMPLE MED
3.0000 10*6.[IU] | INTRAVENOUS | Status: DC
  Administered 2018-01-22: 3 10*6.[IU] via INTRAVENOUS
  Filled 2018-01-22 (×6): qty 100
  Filled 2018-01-22: qty 3

## 2018-01-22 MED ORDER — ZOLPIDEM TARTRATE 5 MG PO TABS: 5.0000 mg | ORAL_TABLET | Freq: Every evening | ORAL | Status: DC | PRN

## 2018-01-22 MED ORDER — BETAMETHASONE SOD PHOS & ACET 6 (3-3) MG/ML IJ SUSP
12.0000 mg | INTRAMUSCULAR | Status: DC
  Administered 2018-01-22: 12 mg via INTRAMUSCULAR

## 2018-01-22 MED ORDER — NIFEDIPINE 10 MG PO CAPS
30.0000 mg | ORAL_CAPSULE | Freq: Once | ORAL | Status: AC
  Administered 2018-01-22: 30 mg via ORAL
  Filled 2018-01-22: qty 3

## 2018-01-22 MED ORDER — SODIUM CHLORIDE 0.9 % IV SOLN
5.0000 10*6.[IU] | Freq: Once | INTRAVENOUS | Status: AC
  Administered 2018-01-22: 5 10*6.[IU] via INTRAVENOUS
  Filled 2018-01-22: qty 5

## 2018-01-22 MED ORDER — NIFEDIPINE 10 MG PO CAPS: 10.0000 mg | ORAL_CAPSULE | Freq: Four times a day (QID) | ORAL | Status: DC

## 2018-01-22 MED ORDER — CALCIUM CARBONATE ANTACID 500 MG PO CHEW: 2.0000 | CHEWABLE_TABLET | ORAL | Status: DC | PRN

## 2018-01-22 MED ORDER — PRENATAL MULTIVITAMIN CH: 1.0000 | ORAL_TABLET | Freq: Every day | ORAL | Status: DC

## 2018-01-22 MED ORDER — BETAMETHASONE SOD PHOS & ACET 6 (3-3) MG/ML IJ SUSP
INTRAMUSCULAR | Status: AC
  Administered 2018-01-22: 12 mg via INTRAMUSCULAR
  Filled 2018-01-22: qty 5

## 2018-01-22 MED ORDER — LACTATED RINGERS IV SOLN
INTRAVENOUS | Status: DC
  Administered 2018-01-22: 10:00:00 via INTRAVENOUS

## 2018-01-22 MED ORDER — DOCUSATE SODIUM 100 MG PO CAPS: 100.0000 mg | ORAL_CAPSULE | Freq: Every day | ORAL | Status: DC

## 2018-01-22 MED ORDER — ACETAMINOPHEN 325 MG PO TABS: 650.0000 mg | ORAL_TABLET | ORAL | Status: DC | PRN

## 2018-01-22 NOTE — H&P (Signed)
Gloria [name] is a 37 y.o. female. She is at [redacted]w[redacted]d gestation. Patient's last menstrual period was 06/08/2017. Estimated Date of Delivery: 03/15/18  Prenatal care site: [facility]   Current pregnancy complicated by:  1. 1st tri vaginal bleeding with [facility], now resolved.  2. PP pre-ecclampsia after 4th baby: hospitalized and placed on diuretics x 6 weeks 3. 1 PTD with first baby at 34-35 weeks and then had 5 full-term, hx Early cervical dilation with previous babies, declined 17P 4. PLANS BTL!!! Papers signed 10/08/17  5. ZOX:WRUE get genetic testing and see [facility] 6.  UTI: enterococcus (Macrobid 100 mg po BID x 7 days on 08/18/17), TOC done 08/24/17 Neg 7. Hx GBS pos with all pregnancies  Chief complaint:  c/o VB and cramping that started around 0700 this morning, had several painful UCs overnight. Bleeding started light pink but then became bright red with thick discharge. Pt states she did have intercourse last night. Pt states she is feeling baby move and denies LOF.   S: Resting comfortably. Active fetal movement.   Maternal Medical History:   Past Medical History:  Diagnosis Date  . Pregnancy induced hypertension   . Preterm labor   . Vaginal Pap smear, abnormal     History reviewed. No pertinent surgical history.  No Known Allergies  Prior to Admission medications   Medication Sig Start Date End Date Taking? Authorizing Provider  Prenatal Vit-Fe Fumarate-FA (PRENATAL MULTIVITAMIN) TABS tablet Take 1 tablet by mouth daily at 12 noon.   Yes [provider]      Social History: She  reports that she has been smoking. She has never used smokeless tobacco. She reports that she does not drink alcohol or use drugs.  Family History: family history includes Arthritis in her father and paternal grandmother; Cancer in her maternal grandfather; Diabetes in her mother and paternal grandmother.   Review of Systems: A full review of systems was performed and negative  except as noted in the HPI.     O:  Pulse 95   Temp 98.3 F (36.8 C) (Oral)   Resp 16   Ht 5\' 3"  (1.6 m)   Wt 66.2 kg   LMP 06/08/2017   BMI 25.86 kg/m  No results found for this or any previous visit (from the past 48 hour(s)).   Constitutional: NAD, AAOx3  HE/ENT: extraocular movements grossly intact, moist mucous membranes CV: RRR PULM: nl respiratory effort, CTABL     Abd: gravid, non-tender, non-distended, soft      Ext: Non-tender, Nonedematous   Psych: mood appropriate, speech normal Pelvic: SSE done; bloody show and mucus noted. Cervix visually dilated on exam, neg pooling or LOF noted.  SVE 3/long/OOP- unable to reach presenting part.    Bedside US done, cephalic position  TOCO: Irreg UCs, q4-82min  Fetal  monitoring: Cat I Appropriate for GA Baseline: 130bpm Variability: moderate Accelerations: present x >2 Decelerations absent    A/P: 37 y.o. G7 P6006 at [redacted]w[redacted]d here for antenatal surveillance for Preterm contractions and bloody show.   Principle Diagnosis: Preterm contractions at 32+4wks   Advanced cervical dilation, pt reports hx advanced dilation with most of her pregnancies.    Fetal Wellbeing: Reassuring Cat 1 tracing.  IV fluids, betamethasone now. Occasional contractions noted- will monitor closely. D/W Dr Dalbert Garnet. Procardia PO given.   PCN for GBS prophy.   GC/CT, Wet prep and GBS by PCR sent.   No FFN, recent IC last night.    [name], CNM 01/22/2018  10:10 AM

## 2018-01-22 NOTE — OB Triage Note (Signed)
Pt is a 36yo G7P6 at [redacted]w[redacted]d that presents from ED c/o VB and cramping that started around 0700 this morning. Bleeding started light pink but then became bright red with thick discharge. Pt states she did have intercourse last night. Pt states she is feeling baby move and denies LOF. Initial [id number] with monitors applied and assessing.

## 2018-01-22 NOTE — Consult Note (Addendum)
Asked by [name], CNM, to provide prenatal consultation for patient at risk for preterm delivery due to preterm labor.  Mother is 37 y.o. G7 P5-1-0-6 who is now 32.[redacted] weeks EGA, had onset UCs early today and has OB Hx of preterm cervical dilation, one delivery at < 35 wks (but infant did not go to NICU, was discharged home with her).  Also has had some bleeding.  SVE 3 cm, long. She is being monitored and has had some FHR decels, and she is being treated with betamethasone and nifedipine.  She is GBS positive and is receiving PCN.  Discussed usual expectations for preterm infant at 11 - [redacted] weeks gestation, including possible needs for DR resuscitation, respiratory support, IV access, temperature support. Explained criteria for discharge and possible length of stay in NICU until 37 - [redacted] wks EGA. Discussed advantages of feeding with mother's milk and availability of donor milk as temporary "bridge."  She plans to pump postnatally.  Patient was attentive, had appropriate questions, and was appreciative of my input.  Thank you for consulting Neonatology.  Total time 25 minutes (15 minutes face-to-face)  JWimmer, MD

## 2018-01-22 NOTE — Discharge Summary (Signed)
Patient ID: KAIAH HOSEA MRN: [id number] DOB/AGE: 06-09-1980 37 y.o.  Admit date: 01/22/2018 Discharge date: 01/22/2018  Admission Diagnoses: Preterm contractions, 3rd trimester  Discharge Diagnoses: Preterm contractions, 3rd trimester  Prenatal Procedures: NST  Consults: Neonatology  Significant Diagnostic Studies:  Results for orders placed or performed during the hospital encounter of 01/22/18 (from the past 168 hour(s))  OB RESULT CONSOLE Group B Strep   Collection Time: 01/22/18 12:00 AM  Result Value Ref Range   GBS Positive   OB RESULTS CONSOLE GC/Chlamydia   Collection Time: 01/22/18 12:00 AM  Result Value Ref Range   Gonorrhea Negative    Chlamydia Negative   Group B strep by PCR   Collection Time: 01/22/18  9:47 AM  Result Value Ref Range   Group B strep by PCR POSITIVE (A) NEGATIVE  Chlamydia/NGC rt PCR ([facility] only)   Collection Time: 01/22/18 10:23 AM  Result Value Ref Range   Specimen source GC/Chlam URINE, RANDOM    Chlamydia Tr NOT DETECTED NOT DETECTED   N gonorrhoeae NOT DETECTED NOT DETECTED  Wet prep, genital   Collection Time: 01/22/18 10:23 AM  Result Value Ref Range   Yeast Wet Prep HPF POC NONE SEEN NONE SEEN   Trich, Wet Prep NONE SEEN NONE SEEN   Clue Cells Wet Prep HPF POC NONE SEEN NONE SEEN   WBC, Wet Prep HPF POC FEW (A) NONE SEEN   Sperm PRESENT   Urinalysis, Routine w reflex microscopic   Collection Time: 01/22/18 10:23 AM  Result Value Ref Range   Color, Urine YELLOW (A) YELLOW   APPearance HAZY (A) CLEAR   Specific Gravity, Urine 1.014 1.005 - 1.030   pH 8.0 5.0 - 8.0   Glucose, UA NEGATIVE NEGATIVE mg/dL   Hgb urine dipstick MODERATE (A) NEGATIVE   Bilirubin Urine NEGATIVE NEGATIVE   Ketones, ur NEGATIVE NEGATIVE mg/dL   Protein, ur NEGATIVE NEGATIVE mg/dL   Nitrite NEGATIVE NEGATIVE   Leukocytes, UA NEGATIVE NEGATIVE   RBC / HPF 0-5 0 - 5 RBC/hpf   WBC, UA 0-5 0 - 5 WBC/hpf   Bacteria, UA NONE SEEN NONE SEEN   Squamous  Epithelial / LPF 0-5 0 - 5  CBC   Collection Time: 01/22/18 10:32 AM  Result Value Ref Range   WBC 11.9 (H) 4.0 - 10.5 K/uL   RBC 3.73 (L) 3.87 - 5.11 MIL/uL   Hemoglobin 11.5 (L) 12.0 - 15.0 g/dL   HCT 27.2 (L) 53.6 - 64.4 %   MCV 90.1 80.0 - 100.0 fL   MCH 30.8 26.0 - 34.0 pg   MCHC 34.2 30.0 - 36.0 g/dL   RDW 03.4 74.2 - 59.5 %   Platelets 221 150 - 400 K/uL   nRBC 0.0 0.0 - 0.2 %  Type and screen   Collection Time: 01/22/18 10:32 AM  Result Value Ref Range   ABO/RH(D) A POS    Antibody Screen NEG    Sample Expiration      01/25/2018 Performed at [facility], [address] [place], [place] [address]     Treatments: IV hydration, steroids, antibiotics, tocolysis  Hospital Course:  This is a 37 y.o. G7 P5106 with IUP at [redacted]w[redacted]d admitted for Preterm contractions and bloody show, noted to have a cervical exam of 3/long/OOP.  No leaking of fluid and had some bloody show.  She was initially started on Procardia 30mg  PO x 1 dose, pt did not tolerate procardia well, c/o lightheadedness and also received betamethasone  x 1 dose.  Her tocolysis was transitioned to Procardia. She was seen by Neonatology during her stay.  She was observed, fetal heart rate monitoring remained reassuring, and she had no signs/symptoms of progressing preterm labor or other maternal-fetal concerns.  Her cervical exam was unchanged from admission.  She was deemed stable for discharge to home with outpatient follow up.  Discharge Physical Exam:  BP (!) 141/66   Pulse (!) 102   Temp 98.8 F (37.1 C) (Oral)   Resp 16   Ht 5\' 3"  (1.6 m)   Wt 66.2 kg   LMP 06/08/2017   BMI 25.86 kg/m   General: NAD CV: RRR Pulm: CTABL, nl effort ABD: s/nd/nt, gravid DVT Evaluation: LE non-ttp, no evidence of DVT on exam.  SVE: 3/long/OOP, no bloody show.  FHT: 125bpm, + accels, no decels, moderate variability TOCO: occasional UC   Discharge Condition: Stable  Disposition: Discharge disposition: 01-Home  or Self Care       Discharge Instructions    Do not have sex or do anything that might make you have an orgasm   Complete by:  As directed    Notify physician for a general feeling that "something is not right"   Complete by:  As directed    Notify physician for increase or change in vaginal discharge   Complete by:  As directed    Notify physician for intestinal cramps, with or without diarrhea, sometimes described as "gas pain"   Complete by:  As directed    Notify physician for leaking of fluid   Complete by:  As directed    Notify physician for low, dull backache, unrelieved by heat or Tylenol   Complete by:  As directed    Notify physician for menstrual like cramps   Complete by:  As directed    Notify physician for pelvic pressure   Complete by:  As directed    Notify physician for uterine contractions.  These may be painless and feel like the uterus is tightening or the baby is  "balling up"   Complete by:  As directed    Notify physician for vaginal bleeding   Complete by:  As directed    PRETERM LABOR:  Includes any of the follwing symptoms that occur between 20 - [redacted] weeks gestation.  If these symptoms are not stopped, preterm labor can result in preterm delivery, placing your baby at risk   Complete by:  As directed      Allergies as of 01/22/2018   No Known Allergies     Medication List    TAKE these medications   prenatal multivitamin Tabs tablet Take 1 tablet by mouth daily at 12 noon.        Signed: Randa Ngo, CNM 01/22/2018  7:49 PM

## 2018-01-23 ENCOUNTER — Observation Stay
Admission: RE | Admit: 2018-01-23 | Discharge: 2018-01-23 | Disposition: A | Discharge status: Home / Self Care | Payer: Medicaid Other | Attending: Obstetrics and Gynecology | Admitting: Obstetrics and Gynecology

## 2018-01-23 DIAGNOSIS — Z3A00 Weeks of gestation of pregnancy not specified: Secondary | ICD-10-CM | POA: Diagnosis not present

## 2018-01-23 DIAGNOSIS — O4703 False labor before 37 completed weeks of gestation, third trimester: Secondary | ICD-10-CM | POA: Diagnosis present

## 2018-01-23 LAB — RPR: RPR Ser Ql: NONREACTIVE

## 2018-01-23 MED ORDER — BETAMETHASONE SOD PHOS & ACET 6 (3-3) MG/ML IJ SUSP
12.0000 mg | Freq: Once | INTRAMUSCULAR | Status: AC
  Administered 2018-01-23: 12 mg via INTRAMUSCULAR

## 2018-01-23 MED ORDER — BETAMETHASONE SOD PHOS & ACET 6 (3-3) MG/ML IJ SUSP: 12.0000 mg | Freq: Once | INTRAMUSCULAR | Status: DC

## 2018-01-23 MED ORDER — BETAMETHASONE SOD PHOS & ACET 6 (3-3) MG/ML IJ SUSP
INTRAMUSCULAR | Status: AC
  Filled 2018-01-23: qty 5

## 2018-01-23 NOTE — OB Triage Note (Signed)
Patient here for second dose of betamethasone, no complaints.

## 2018-01-26 ENCOUNTER — Other Ambulatory Visit
Admission: RE | Admit: 2018-01-26 | Discharge: 2018-01-26 | Disposition: A | Discharge status: Home / Self Care | Payer: Medicaid Other | Source: Ambulatory Visit | Attending: Obstetrics and Gynecology | Admitting: Obstetrics and Gynecology

## 2018-01-26 ENCOUNTER — Other Ambulatory Visit: Payer: Self-pay | Admitting: Obstetrics and Gynecology

## 2018-01-26 DIAGNOSIS — O4703 False labor before 37 completed weeks of gestation, third trimester: Secondary | ICD-10-CM | POA: Insufficient documentation

## 2018-01-26 LAB — FETAL FIBRONECTIN: Fetal Fibronectin: POSITIVE — AB

## 2018-01-30 ENCOUNTER — Encounter: Payer: Self-pay | Admitting: *Deleted

## 2018-01-30 ENCOUNTER — Observation Stay
Admission: EM | Admit: 2018-01-30 | Discharge: 2018-01-30 | Disposition: A | Discharge status: Home / Self Care | Payer: Medicaid Other | Source: Home / Self Care | Admitting: Obstetrics and Gynecology

## 2018-01-30 DIAGNOSIS — O4703 False labor before 37 completed weeks of gestation, third trimester: Secondary | ICD-10-CM | POA: Diagnosis present

## 2018-01-30 DIAGNOSIS — Z3A33 33 weeks gestation of pregnancy: Secondary | ICD-10-CM | POA: Insufficient documentation

## 2018-01-30 DIAGNOSIS — F17200 Nicotine dependence, unspecified, uncomplicated: Secondary | ICD-10-CM

## 2018-01-30 DIAGNOSIS — O99333 Smoking (tobacco) complicating pregnancy, third trimester: Secondary | ICD-10-CM

## 2018-01-30 LAB — ROM PLUS (ARMC ONLY): Rom Plus: NEGATIVE

## 2018-01-30 LAB — WET PREP, GENITAL
Clue Cells Wet Prep HPF POC: NONE SEEN
SPERM: NONE SEEN
Trich, Wet Prep: NONE SEEN
Yeast Wet Prep HPF POC: NONE SEEN

## 2018-01-30 MED ORDER — TERBUTALINE SULFATE 1 MG/ML IJ SOLN
0.2500 mg | Freq: Once | INTRAMUSCULAR | Status: AC
  Administered 2018-01-30: 0.25 mg via SUBCUTANEOUS

## 2018-01-30 MED ORDER — TERBUTALINE SULFATE 1 MG/ML IJ SOLN
0.2500 mg | Freq: Once | INTRAMUSCULAR | Status: DC
  Filled 2018-01-30: qty 1

## 2018-01-30 MED ORDER — NIFEDIPINE 10 MG PO CAPS
10.0000 mg | ORAL_CAPSULE | Freq: Four times a day (QID) | ORAL | Status: DC
  Administered 2018-01-30: 10 mg via ORAL
  Filled 2018-01-30: qty 1

## 2018-01-30 MED ORDER — TERBUTALINE SULFATE 1 MG/ML IJ SOLN
INTRAMUSCULAR | Status: AC
  Administered 2018-01-30: 0.25 mg via SUBCUTANEOUS
  Filled 2018-01-30: qty 1

## 2018-01-30 MED ORDER — ACETAMINOPHEN 325 MG PO TABS: 650.0000 mg | ORAL_TABLET | ORAL | Status: DC | PRN

## 2018-01-30 NOTE — Progress Notes (Signed)
Labor Progress Note  Gloria [name] is a 37 y.o. G7P1006 at [redacted]w[redacted]d by ultrasound admitted for preterm labor.  Subjective:  Continues to feel painful contractions. Improved for a little bit after Procardia, but have recently gotten closer together.   Objective: BP 121/66   Pulse 91   Temp 98.2 F (36.8 C) (Oral)   Resp 16   LMP 06/08/2017   Fetal Assessment: FHT:  FHR: 135 bpm, variability: moderate,  accelerations:  Present,  decelerations:  Present variable Category/reactivity:  Category II UC:   irregular, every 3-12 minutes SVE:   Deferred Membrane status: intact Amniotic color: n/a  Labs: Lab Results  Component Value Date   WBC 11.9 (H) 01/22/2018   HGB 11.5 (L) 01/22/2018   HCT 33.6 (L) 01/22/2018   MCV 90.1 01/22/2018   PLT 221 01/22/2018    Assessment / Plan: Preterm labor  Preterm Labor: s/p one dose of nifedipine 10mg  PO Fetal Wellbeing:  Category II for rare variable decel, overall reassuring  Discussed with Dr. Leeroy Bock Ward. Plan to give dose of terbutaline 0.25mg .   Genia Del, CNM 01/30/2018, 1:19 PM

## 2018-01-30 NOTE — H&P (Signed)
OB History & Physical   History of Present Illness:  Chief Complaint:   HPI:  GRACY EHLY is a 37 y.o. G18P1006 female at [redacted]w[redacted]d dated by [redacted]w[redacted]d ultrasound.  She presents to L&D with reports of contractions. She was seen in triage on 11/1 with reports of painful contractions. After being discharged that day, she reports that she has continued to have contractions throughout the week. They usually come 2-3 times an hour, but last night, around 0230, she started to have them every 10 minutes. She rates them 8/10 and states that they feel like labor contractions.   She reports:  -active fetal movement -concern for leakage of fluid -no vaginal bleeding -onset of contractions at 0230 currently every 10  Pregnancy Issues: 1. Subchorionic hemorrhage, stable in size, bleeding in first trimester, now resolved 2. AMA, 35 at time of delivery 3. History of preterm delivery at 34-35 weeks with G1, declines 17-OHP 4. History of postpartum pre-eclampsia with G4, hospitalized and placed on diuretics for 6 weeks 5. Preterm contractions in third trimester 6. GBS positive  Maternal Medical History:   Past Medical History:  Diagnosis Date  . Pregnancy induced hypertension   . Preterm labor   . Vaginal Pap smear, abnormal     History reviewed. No pertinent surgical history.  No Known Allergies  Prior to Admission medications   Medication Sig Start Date End Date Taking? Authorizing Provider  Prenatal Vit-Fe Fumarate-FA (PRENATAL MULTIVITAMIN) TABS tablet Take 1 tablet by mouth daily at 12 noon.   Yes [provider]     Prenatal care site: [facility]   Social History: She  reports that she has been smoking. She has never used smokeless tobacco. She reports that she does not drink alcohol or use drugs.  Family History: family history includes Arthritis in her father and paternal grandmother; Cancer in her maternal grandfather; Diabetes in her mother and paternal grandmother.    Review of Systems: A full review of systems was performed and negative except as noted in the HPI.    Physical Exam:  Vital Signs: BP (!) 101/55   Pulse 99   Temp 98.2 F (36.8 C) (Oral)   Resp 14   LMP 06/08/2017   General:   alert, cooperative, appears stated age and no distress  Skin:  normal and no rash or abnormalities  Neurologic:    Alert & oriented x 3  Lungs:   clear to auscultation bilaterally  Heart:   regular rate and rhythm, S1, S2 normal, no murmur, click, rub or gallop  Abdomen:  soft, non-tender; bowel sounds normal; no masses,  no organomegaly  Pelvis:  External genitalia: normal general appearance Vaginal: large amount of milky white discharge present  SSE: no pooling, negative Nitrazine, negative ferning  FHT:  140 BPM  Presentations: cephalic  Cervix:  per RN Jory Sims at [phone]   Dilation: 3.5   Effacement: 80%   Station:  -2  Extremities: : non-tender, symmetric, no edema bilaterally.    Results for orders placed or performed during the hospital encounter of 01/30/18 (from the past 24 hour(s))  ROM Plus ([facility] only)     Status: None   Collection Time: 01/30/18  9:41 AM  Result Value Ref Range   Rom Plus NEGATIVE   Wet prep, genital     Status: Abnormal   Collection Time: 01/30/18  9:41 AM  Result Value Ref Range   Yeast Wet Prep HPF POC NONE SEEN NONE SEEN  Trich, Wet Prep NONE SEEN NONE SEEN   Clue Cells Wet Prep HPF POC NONE SEEN NONE SEEN   WBC, Wet Prep HPF POC MANY (A) NONE SEEN   Sperm NONE SEEN     Pertinent Results:  Prenatal Labs: Blood type/Rh A+  Antibody screen neg  Rubella Immune  Varicella Immune  RPR NR  HBsAg Neg  HIV NR  GC neg  Chlamydia neg  Genetic screening cfDNA negative, female fetus  1 hour GTT Not performed  3 hour GTT n/a  GBS positive   FHT: FHR: 135 bpm, variability: moderate,  accelerations:  Present,  decelerations:  Present occasional variable Category/reactivity:  Category II TOCO: irregular, every  7-11 minutes  Assessment:  KATALYN MATIN is a 37 y.o. G56P1006 female at [redacted]w[redacted]d with preterm uterine contractions.   Plan:  1. Place in observation.   2. Fetal Well being: - Fetal Tracing: category II for occasional variable decel, overall reassuring - GBS positive - Presentation: cephalic confirmed by Leopold's   3. Preterm labor with advanced cervical dilation: - Start 10mg  nifedipine q6h for tocoloysis - No cervical change between earlier exams, plan to recheck later today - Encouraged PO hydration, with provide IV hydration if necessary - FFN from 01/26/18 positive, no recent sexual intercourse  Discussed with Dr. Elesa Massed, who is agreement with this plan.    Genia Del, CNM 01/30/2018 10:15 AM ----- Genia Del Certified Nurse Midwife [facility], Department of OB/GYN [facility]

## 2018-01-30 NOTE — OB Triage Note (Signed)
Recvd pt from ED. Pt c/o contractions that started around 0230 this morning. Says they are about 10-12 min apart. Feeling baby move well. States she has pink tinged mucous.

## 2018-01-30 NOTE — Discharge Instructions (Signed)
Please keep next scheduled appointment on Wednesday at [facility].  If you have questions you may call the on call provider.  You may also call the nurses desk at the [facility] with questions at [phone].  If you have urgent concerns please go to the nearest ED for evaluation.

## 2018-01-30 NOTE — Progress Notes (Addendum)
Labor Progress Note  Gloria [name] is a 37 y.o. G7P1006 at [redacted]w[redacted]d by ultrasound admitted for preterm labor.  Subjective:  Feeling more comfortable after terbutaline administration. Has only felt 1-2 contractions and they are less intense than previous contractions.   Objective: BP 137/78   Pulse (!) 101   Temp 99.1 F (37.3 C) (Oral)   Resp 16   LMP 06/08/2017   Fetal Assessment: FHT:  FHR: 145 bpm, variability: moderate,  accelerations:  Present,  decelerations:  Absent Category/reactivity:  Category I UC:   occasional SVE:    Dilation: 3cm  Effacement: 50%  Station:  -3  Consistency: medium  Position: posterior  Membrane status: intact Amniotic color: n/a  Labs: Lab Results  Component Value Date   WBC 11.9 (H) 01/22/2018   HGB 11.5 (L) 01/22/2018   HCT 33.6 (L) 01/22/2018   MCV 90.1 01/22/2018   PLT 221 01/22/2018    Assessment / Plan: Preterm labor  Preterm Labor: s/p one dose of nifedipine 10mg  PO and one dose of terbutaline 0.25mg  IM, decreased frequency and intensity of contractions, cervix unchanged Fetal Wellbeing:  Category I   Plan to give additional dose of terbutaline now and again in 2-4 hours if contractions persist.   Genia Del, CNM 01/30/2018, 2:59 PM

## 2018-01-30 NOTE — Discharge Summary (Signed)
Gloria [name] is a 37 y.o. female. She is at 103w5d gestation. Patient's last menstrual period was 06/08/2017. Estimated Date of Delivery: 03/15/18  Prenatal care site: [facility]  Chief complaint: uterine contractions Location: abdomen Onset/timing: overnight, intermittent  Severity: 8/10 at worst Context: Gloria [name] was seen in triage on 11/1 with reports of painful contractions. After being discharged that day, she reports that she has continued to have contractions throughout the week. They usually come 2-3 times an hour, but last night, around 0230, she started to have them every 10 minutes. This morning when she came in, she rated them 8/10 pain and stated that they felt like labor contractions.   S: Resting comfortably.   She reports:  -active fetal movement -no vaginal bleeding -rare, mild contractions  Maternal Medical History:   Past Medical History:  Diagnosis Date  . Pregnancy induced hypertension   . Preterm labor   . Vaginal Pap smear, abnormal     History reviewed. No pertinent surgical history.  No Known Allergies  Prior to Admission medications   Medication Sig Start Date End Date Taking? Authorizing Provider  Prenatal Vit-Fe Fumarate-FA (PRENATAL MULTIVITAMIN) TABS tablet Take 1 tablet by mouth daily at 12 noon.   Yes [provider]     Social History: She  reports that she has been smoking. She has never used smokeless tobacco. She reports that she does not drink alcohol or use drugs.  Family History: family history includes Arthritis in her father and paternal grandmother; Cancer in her maternal grandfather; Diabetes in her mother and paternal grandmother.  Review of Systems: A full review of systems was performed and negative except as noted in the HPI.    O:  BP 124/65   Pulse (!) 102   Temp 98.7 F (37.1 C) (Oral)   Resp 16   LMP 06/08/2017  Results for orders placed or performed during the hospital encounter of 01/30/18  (from the past 48 hour(s))  Wet prep, genital   Collection Time: 01/30/18  9:41 AM  Result Value Ref Range   Yeast Wet Prep HPF POC NONE SEEN NONE SEEN   Trich, Wet Prep NONE SEEN NONE SEEN   Clue Cells Wet Prep HPF POC NONE SEEN NONE SEEN   WBC, Wet Prep HPF POC MANY (A) NONE SEEN   Sperm NONE SEEN   ROM Plus ([facility] only)   Collection Time: 01/30/18  9:41 AM  Result Value Ref Range   Rom Plus NEGATIVE      Constitutional: NAD, AAOx3  HE/ENT: extraocular movements grossly intact, moist mucous membranes CV: RRR PULM: normal respiratory effort, CTABL    Abd: gravid, non-tender, non-distended, soft    Ext: Non-tender, Nonedmeatous   Psych: mood appropriate, speech normal Pelvic 3cm/50%/-3 posterior/medium, unchanged over 10 hours  Fetal monitoring:  Baseline: 150bpm Variability: moderate Accelerations: 15x15s present Decelerations: occasional variable Time: 13 hours Toco: rare to occasional contractions over the past 3 hours   A/P: 37 y.o. [redacted]w[redacted]d here for antenatal surveillance during pregnancy.  Principle diagnosis: Preterm contractions  Labor  Not present  No change in cervical dilation  Fetal Wellbeing  Tracing reassuring for GA  Preterm contractions  Contractions rare to occasional after one dose of nifedipine PO and two doses of terbutaline IM  Advised to continue pelvic rest  D/c home stable, precautions reviewed, follow-up as scheduled.   Per patient, has appointment for Wednesday 02/03/18 with CNM McVey.   Genia Del 01/30/2018 6:38 PM  ----- Claris Che  Particia Nearing, CNM Certified Nurse Midwife [facility], Department of OB/GYN [facility]

## 2018-01-31 ENCOUNTER — Inpatient Hospital Stay: Payer: Medicaid Other | Admitting: Anesthesiology

## 2018-01-31 ENCOUNTER — Encounter: Payer: Self-pay | Admitting: *Deleted

## 2018-01-31 ENCOUNTER — Inpatient Hospital Stay
Admission: EM | Admit: 2018-01-31 | Discharge: 2018-02-02 | DRG: 807 | Disposition: A | Discharge status: Home / Self Care | Payer: Medicaid Other | Attending: Certified Nurse Midwife | Admitting: Certified Nurse Midwife

## 2018-01-31 DIAGNOSIS — O99824 Streptococcus B carrier state complicating childbirth: Secondary | ICD-10-CM | POA: Diagnosis present

## 2018-01-31 DIAGNOSIS — Z3A33 33 weeks gestation of pregnancy: Secondary | ICD-10-CM

## 2018-01-31 DIAGNOSIS — O99334 Smoking (tobacco) complicating childbirth: Secondary | ICD-10-CM | POA: Diagnosis present

## 2018-01-31 DIAGNOSIS — F17200 Nicotine dependence, unspecified, uncomplicated: Secondary | ICD-10-CM | POA: Diagnosis present

## 2018-01-31 LAB — CBC
HCT: 32.3 % — ABNORMAL LOW (ref 36.0–46.0)
HEMOGLOBIN: 10.8 g/dL — AB (ref 12.0–15.0)
MCH: 30.6 pg (ref 26.0–34.0)
MCHC: 33.4 g/dL (ref 30.0–36.0)
MCV: 91.5 fL (ref 80.0–100.0)
Platelets: 189 10*3/uL (ref 150–400)
RBC: 3.53 MIL/uL — ABNORMAL LOW (ref 3.87–5.11)
RDW: 13 % (ref 11.5–15.5)
WBC: 17.5 10*3/uL — AB (ref 4.0–10.5)
nRBC: 0 % (ref 0.0–0.2)

## 2018-01-31 LAB — TYPE AND SCREEN
ABO/RH(D): A POS
Antibody Screen: NEGATIVE

## 2018-01-31 MED ORDER — TERBUTALINE SULFATE 1 MG/ML IJ SOLN
0.2500 mg | INTRAMUSCULAR | Status: AC | PRN
  Administered 2018-01-31 (×4): 0.25 mg via SUBCUTANEOUS

## 2018-01-31 MED ORDER — LACTATED RINGERS IV SOLN: 500.0000 mL | Freq: Once | INTRAVENOUS | Status: DC

## 2018-01-31 MED ORDER — WITCH HAZEL-GLYCERIN EX PADS: 1.0000 "application " | MEDICATED_PAD | CUTANEOUS | Status: DC | PRN

## 2018-01-31 MED ORDER — OXYTOCIN 40 UNITS IN LACTATED RINGERS INFUSION - SIMPLE MED
INTRAVENOUS | Status: AC
  Filled 2018-01-31: qty 1000

## 2018-01-31 MED ORDER — TERBUTALINE SULFATE 1 MG/ML IJ SOLN: 0.2500 mg | Freq: Once | INTRAMUSCULAR | Status: DC

## 2018-01-31 MED ORDER — TERBUTALINE SULFATE 1 MG/ML IJ SOLN
0.2500 mg | INTRAMUSCULAR | Status: DC
  Administered 2018-01-31: 0.25 mg via SUBCUTANEOUS

## 2018-01-31 MED ORDER — AMMONIA AROMATIC IN INHA
RESPIRATORY_TRACT | Status: AC
  Filled 2018-01-31: qty 10

## 2018-01-31 MED ORDER — LACTATED RINGERS IV SOLN: 500.0000 mL | INTRAVENOUS | Status: DC | PRN

## 2018-01-31 MED ORDER — SIMETHICONE 80 MG PO CHEW: 160.0000 mg | CHEWABLE_TABLET | ORAL | Status: DC | PRN

## 2018-01-31 MED ORDER — DIPHENHYDRAMINE HCL 25 MG PO CAPS: 25.0000 mg | ORAL_CAPSULE | Freq: Four times a day (QID) | ORAL | Status: DC | PRN

## 2018-01-31 MED ORDER — TERBUTALINE SULFATE 1 MG/ML IJ SOLN
0.2500 mg | INTRAMUSCULAR | Status: DC
  Administered 2018-01-31: 0.25 mg via SUBCUTANEOUS
  Filled 2018-01-31: qty 1

## 2018-01-31 MED ORDER — PRENATAL MULTIVITAMIN CH
1.0000 | ORAL_TABLET | Freq: Every day | ORAL | Status: DC
  Administered 2018-02-01: 1 via ORAL
  Filled 2018-01-31: qty 1

## 2018-01-31 MED ORDER — TERBUTALINE SULFATE 1 MG/ML IJ SOLN
INTRAMUSCULAR | Status: AC
  Administered 2018-01-31: 0.25 mg via SUBCUTANEOUS
  Filled 2018-01-31: qty 1

## 2018-01-31 MED ORDER — EPHEDRINE 5 MG/ML INJ
10.0000 mg | INTRAVENOUS | Status: DC | PRN
  Filled 2018-01-31: qty 2

## 2018-01-31 MED ORDER — IBUPROFEN 600 MG PO TABS
600.0000 mg | ORAL_TABLET | Freq: Four times a day (QID) | ORAL | Status: DC
  Administered 2018-01-31 – 2018-02-02 (×4): 600 mg via ORAL
  Filled 2018-01-31 (×6): qty 1

## 2018-01-31 MED ORDER — PENICILLIN G 3 MILLION UNITS IVPB - SIMPLE MED
3.0000 10*6.[IU] | INTRAVENOUS | Status: DC
  Filled 2018-01-31 (×8): qty 100

## 2018-01-31 MED ORDER — ONDANSETRON HCL 4 MG PO TABS: 4.0000 mg | ORAL_TABLET | ORAL | Status: DC | PRN

## 2018-01-31 MED ORDER — COCONUT OIL OIL
1.0000 "application " | TOPICAL_OIL | Status: DC | PRN
  Filled 2018-01-31: qty 120

## 2018-01-31 MED ORDER — LACTATED RINGERS IV SOLN
INTRAVENOUS | Status: DC
  Administered 2018-01-31: 05:00:00 via INTRAVENOUS

## 2018-01-31 MED ORDER — FENTANYL 2.5 MCG/ML W/ROPIVACAINE 0.15% IN NS 100 ML EPIDURAL (ARMC)
EPIDURAL | Status: AC
  Filled 2018-01-31: qty 100

## 2018-01-31 MED ORDER — OXYTOCIN 10 UNIT/ML IJ SOLN
INTRAMUSCULAR | Status: AC
  Filled 2018-01-31: qty 2

## 2018-01-31 MED ORDER — PHENYLEPHRINE 40 MCG/ML (10ML) SYRINGE FOR IV PUSH (FOR BLOOD PRESSURE SUPPORT)
80.0000 ug | PREFILLED_SYRINGE | INTRAVENOUS | Status: DC | PRN
  Filled 2018-01-31: qty 5

## 2018-01-31 MED ORDER — DIBUCAINE 1 % RE OINT: 1.0000 "application " | TOPICAL_OINTMENT | RECTAL | Status: DC | PRN

## 2018-01-31 MED ORDER — OXYTOCIN 40 UNITS IN LACTATED RINGERS INFUSION - SIMPLE MED
2.5000 [IU]/h | INTRAVENOUS | Status: DC
  Administered 2018-01-31 – 2018-02-01 (×2): 2.5 [IU]/h via INTRAVENOUS

## 2018-01-31 MED ORDER — ONDANSETRON HCL 4 MG/2ML IJ SOLN: 4.0000 mg | INTRAMUSCULAR | Status: DC | PRN

## 2018-01-31 MED ORDER — ACETAMINOPHEN 325 MG PO TABS: 650.0000 mg | ORAL_TABLET | ORAL | Status: DC | PRN

## 2018-01-31 MED ORDER — FENTANYL 2.5 MCG/ML W/ROPIVACAINE 0.15% IN NS 100 ML EPIDURAL (ARMC): 12.0000 mL/h | EPIDURAL | Status: DC

## 2018-01-31 MED ORDER — LIDOCAINE HCL (PF) 1 % IJ SOLN
INTRAMUSCULAR | Status: AC
  Filled 2018-01-31: qty 30

## 2018-01-31 MED ORDER — BENZOCAINE-MENTHOL 20-0.5 % EX AERO
1.0000 "application " | INHALATION_SPRAY | CUTANEOUS | Status: DC | PRN
  Administered 2018-01-31: 1 via TOPICAL
  Filled 2018-01-31: qty 56

## 2018-01-31 MED ORDER — DIPHENHYDRAMINE HCL 50 MG/ML IJ SOLN: 12.5000 mg | INTRAMUSCULAR | Status: DC | PRN

## 2018-01-31 MED ORDER — MISOPROSTOL 200 MCG PO TABS
ORAL_TABLET | ORAL | Status: AC
  Filled 2018-01-31: qty 4

## 2018-01-31 MED ORDER — OXYTOCIN BOLUS FROM INFUSION
500.0000 mL | Freq: Once | INTRAVENOUS | Status: AC
  Administered 2018-01-31: 500 mL via INTRAVENOUS

## 2018-01-31 MED ORDER — SODIUM CHLORIDE 0.9 % IV SOLN: 5.0000 10*6.[IU] | Freq: Once | INTRAVENOUS | Status: DC

## 2018-01-31 MED ORDER — SENNOSIDES-DOCUSATE SODIUM 8.6-50 MG PO TABS
2.0000 | ORAL_TABLET | ORAL | Status: DC
  Administered 2018-01-31 – 2018-02-02 (×2): 2 via ORAL
  Filled 2018-01-31 (×2): qty 2

## 2018-01-31 NOTE — Addendum Note (Signed)
Addendum  created 01/31/18 1723 by Dannielle Baskins, Cleda Mccreedy, MD   Intraprocedure Event edited

## 2018-01-31 NOTE — Discharge Instructions (Signed)
Discharge instructions:   Call office if you have any of the following: headache, shortness of breath, visual changes, fever >101.0 F, chills, breast concerns, excessive vaginal bleeding, incision drainage or problems, leg pain or redness, depression or any other concerns.   Activity: Do not lift > 10 lbs for 6 weeks.  No intercourse or tampons for 6 weeks.  No driving for 1-2 weeks.   Call your doctor for increased pain or vaginal bleeding, temperature above 101.0, depression, or concerns.  No strenuous activity or heavy lifting for 6 weeks.  No intercourse, tampons, douching, or enemas for 6 weeks.  No tub baths-showers only.  No driving for 2 weeks or while taking pain medications.  Continue prenatal vitamin and iron.  Increase calories and fluids while breastfeeding.  You may have a slight fever when your milk comes in, but it should go away on its own.  If it does not, and rises above 101.0 please call the doctor.  For concerns about your baby, please call your pediatrician For breastfeeding concerns, the lactation consultant can be reached at [phone]    After Your Delivery Discharge Instructions   Postpartum: Care Instructions  After childbirth (postpartum period), your body goes through many changes. Some of these changes happen over several weeks. In the hours after delivery, your body will begin to recover from childbirth while it prepares to breastfeed your newborn. You may feel emotional during this time. Your hormones can shift your mood without warning for no clear reason.  In the first couple of weeks after childbirth, many women have emotions that change from happy to sad. You may find it hard to sleep. You may cry a lot. This is called the "baby blues." These overwhelming emotions often go away within a couple of days or weeks. But it's important to discuss your feelings with your doctor.  You should call your care provider if you have unrelieved feelings  of:  Inability to cope  Sadness  Anxiety  Lack of interest in baby  Insomnia  Crying  It is easy to get too tired and overwhelmed during the first weeks after childbirth. Don't try to do too much. Get rest whenever you can, accept help from others, and eat well and drink plenty of fluids.  About 4 to 6 weeks after your baby's birth, you will have a follow-up visit with your care provider. This visit is your time to talk to your provider about anything you are concerned or curious about.  Follow-up care is a key part of your treatment and safety. Be sure to make and go to all appointments, and call your doctor if you are having problems. It's also a good idea to know your test results and keep a list of the medicines you take.  How can you care for yourself at home?  Sleep or rest when your baby sleeps.  Get help with household chores from family or friends, if you can. Do not try to do it all yourself.  If you have hemorrhoids or swelling or pain around the opening of your vagina, try using cold and heat. You can put ice or a cold pack on the area for 10 to 20 minutes at a time. Put a thin cloth between the ice and your skin. Also try sitting in a few inches of warm water (sitz bath) 3 times a day and after bowel movements.  Take pain medicines exactly as directed.  If the provider gave you a prescription medicine for  pain, take it as prescribed.  If you do not have a prescription and need something over the counter, you can take:  Ibuprofen (Motrin, Advil) up to 600mg  every 6 hours as needed for pain  Acetaminophen (Tylenol) up to 650mg  every 4 hours as needed for pain  Some people find it helpful to alternate between these two medications.   No driving for 1-2 weeks or while taking pain medications.   Eat more fiber to avoid constipation. Include foods such as whole-grain breads and cereals, raw vegetables, raw and dried fruits, and beans.  Drink plenty of fluids, enough  so that your urine is light yellow or clear like water. If you have kidney, heart, or liver disease and have to limit fluids, talk with your doctor before you increase the amount of fluids you drink.  Do not put anything in the vagina for 6 weeks. This means no sex, no tampons, no douching, and no enemas.  If you have stitches, keep the area clean by pouring or spraying warm water over the area outside your vagina and anus after you use the toilet.  No strenuous activity or heavy lifting for 6 weeks   No tub baths; showers only  Continue prenatal vitamin and iron.  If breastfeeding:  Increase calories and fluids while breastfeeding.  You may have a slight fever when your milk comes in, but it should go away on its own. If it does not, and rises above 101.0 please call the doctor.  For breastfeeding concerns, the lactation consultant can be reached at [phone].  For concerns about your baby, please call your pediatrician.   Keep a list of questions to bring to your postpartum visit. Your questions might be about:  Changes in your breasts, such as lumps or soreness.  When to expect your menstrual period to start again.  What form of birth control is best for you.  Weight you have put on during the pregnancy.  Exercise options.  What foods and drinks are best for you, especially if you are breastfeeding.  Problems you might be having with breastfeeding.  When you can have sex. Some women may want to talk about lubricants for the vagina.  Any feelings of sadness or restlessness that you are having.   When should you call for help?  Call 911 anytime you think you may need emergency care. For example, call if:  You have thoughts of harming yourself, your baby, or another person.  You passed out (lost consciousness).  Call the office at [phone] or seek immediate medical care if:  If you have heavy bleeding such that you are soaking 1 pad in an hour for 2  hours  You are dizzy or lightheaded, or you feel like you may faint.  You have a fever; a temperature of 101.0 F or greater  Chills  Difficulty urinating  Headache unrelieved by "pain meds"   Visual changes  Pain in the right side of your belly near your ribs  Breasts reddened, hard, hot to the touch or any other breast concerns  Nipple discharge which is foul-smelling or contains pus   New pain unrelieved with recommended over-the-counter dosages  Difficulty breathing with or without chest pain   New leg pain, swelling, or redness, especially if it is only on one leg  Any other concerns  Watch closely for changes in your health, and be sure to contact your provider if:  You have new or worse vaginal discharge.  You feel sad  or depressed.  You are having problems with your breasts or breastfeeding.

## 2018-01-31 NOTE — Discharge Summary (Signed)
Obstetric Discharge Summary   Patient Name: Gloria [name] DOB: [birth year] MRN: [id number]  Date of Admission: 01/31/2018 Date of Delivery: 01/31/2018 Delivered by: Dr. Leeroy Bock Ward Date of Discharge: 02/02/18 Primary OB: [facility]  WUJ:[id number] last menstrual period was 06/08/2017. EDC Estimated Date of Delivery: 03/15/18 Gestational Age at Delivery: [redacted]w[redacted]d   Antepartum complications:  1. Subchorionic hemorrhage, stable in size, bleeding in first trimester, now resolved 2. AMA, 35 at time of delivery 3. History of preterm delivery at 34-35 weeks with G1, declines 17-OHP 4. History of postpartum pre-eclampsia with G4, hospitalized and placed on diuretics for 6 weeks 5. Preterm contractions in third trimester 6. GBS positive 7. Grandmultipara  Admitting Diagnosis: preterm labor  Secondary Diagnoses: Patient Active Problem List   Diagnosis Date Noted  . Preterm delivery, delivered 02/02/2018  . Preterm labor in third trimester 01/31/2018  . Preterm uterine contractions in third trimester, antepartum 01/30/2018  . Preterm uterine contractions, antepartum, third trimester 01/23/2018  . SVD (spontaneous vaginal delivery) 08/16/2014  . Indication for care in labor or delivery 08/15/2014  . 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    Augmentation: None Complications: None Intrapartum complications/course: Mom presented to L&D with preterm contractions, given procardia and terbutaline, and discharged after a period of observation with no contractions.  Returned soon after with more frequent contractions.  Again given terbutaline but contractions persisted, then she declined further intervention.  Rapidly progressed to complete, second stage: one push delivery of fetal head and body without difficulty.  Baby placed on mom's chest, and attended to by peds. Cord was then clamped after one minute, and cut by mom. Placenta spontaneously delivered, intact.   IV pitocin given for hemorrhage  prophylaxis Delivery Type: spontaneous vaginal delivery, preterm at 34+1 weeks  Anesthesia: none Placenta: spontaneous Laceration: none Episiotomy: none  Newborn Data: Live born female  Birth Weight: 4 lb 15.7 oz (2260 g) APGAR: 8, 9   Newborn Delivery   Birth date/time:  01/31/2018 16:27:00 Delivery type:  Vaginal, Spontaneous      Postpartum Course  Patient had an uncomplicated postpartum course.  By time of discharge on PPD#2, her pain was controlled on oral pain medications; she had appropriate lochia and was ambulating, voiding without difficulty and tolerating regular diet.  She was deemed stable for discharge to home.       Labs: CBC Latest Ref Rng & Units 02/01/2018 01/31/2018 01/22/2018  WBC 4.0 - 10.5 K/uL 16.3(H) 17.5(H) 11.9(H)  Hemoglobin 12.0 - 15.0 g/dL 10.3(L) 10.8(L) 11.5(L)  Hematocrit 36.0 - 46.0 % 30.6(L) 32.3(L) 33.6(L)  Platelets 150 - 400 K/uL 203 189 221   A POS  Physical exam:  BP 122/87 (BP Location: Right Arm)   Pulse 81   Temp 98 F (36.7 C) (Oral)   Resp 18   LMP 06/08/2017   SpO2 100%  General: alert and no distress Pulm: normal respiratory effort Lochia: appropriate Abdomen: soft, NT Uterine Fundus: firm, below umbilicus Extremities: No evidence of DVT seen on physical exam. No lower extremity edema.   Disposition: stable, discharge to home Baby Feeding: breastmilk Baby Disposition:NICU   Contraception: desires interval PP BTL  Prenatal Labs:  Blood type/Rh A+  Antibody screen neg  Rubella Immune  Varicella Immune  RPR NR  HBsAg Neg  HIV NR  GC neg  Chlamydia neg  Genetic screening cfDNA negative, female fetus  1 hour GTT Not performed  3 hour GTT n/a  GBS positive    Rh Immune globulin given: n/a Rubella vaccine  given: n/a Tdap vaccine given in AP or PP setting: 12/14/2017 Flu vaccine given in AP or PP setting:   Plan:  Gloria [name] was discharged to home in good condition. [facility]-up appointment at [facility]  OB/GYN with Dr. Elesa Massed in 3 weeks to schedule interval tubal.  Discharge Instructions: Per After Visit Summary. Activity: Advance as tolerated. Pelvic rest for 6 weeks.   Diet: Regular Discharge Medications: Allergies as of 02/02/2018   No Known Allergies     Medication List    TAKE these medications   prenatal multivitamin Tabs tablet Take 1 tablet by mouth daily at 12 noon.      Outpatient follow up:  Follow-up Information    Ward, Elenora Fender, MD. Schedule an appointment as soon as possible for a visit in 3 week(s).   Specialty:  Obstetrics and Gynecology Why:  For routine postpartum visit.  Contact information: [address] [place] [address] [phone]            Signed: Maisie Fus SchermerhornMD

## 2018-01-31 NOTE — Progress Notes (Signed)
Labor Progress Note  Gloria [name] is a 37 y.o. G7P1006 at [redacted]w[redacted]d by ultrasound admitted for preterm labor.  Subjective:  Patient feeling regular, strong contractions. She does not feel like the last dose of terbutaline did much to slow her contractions. After discussing her options with her husband, she declines further attempts at tocolysis and wants to continue with expectant management.   Objective: BP 122/69   Pulse (!) 113   Temp 98 F (36.7 C) (Oral)   Resp 16   LMP 06/08/2017   SpO2 96%   Fetal Assessment: FHT:  FHR: 145 bpm, variability: moderate,  accelerations:  Present,  decelerations:  Present variable Category/reactivity:  Category II UC: Regular, currently every 4-8 minutes SVE:  Deferred Membrane status: intact Amniotic color: n/a  Labs: Lab Results  Component Value Date   WBC 17.5 (H) 01/31/2018   HGB 10.8 (L) 01/31/2018   HCT 32.3 (L) 01/31/2018   MCV 91.5 01/31/2018   PLT 189 01/31/2018    Assessment / Plan: Preterm labor  Labor: s/p 6 doses of terbutaline, continues to have regular contractions; expectant management Fetal Wellbeing:  Category II for rare variable deceleration, overall reassuring Anticipated MOD:  NSVD   Discussed options of continued attempts at tocolysis versus expectant management with patient. After discussing with her husband, she now desires expectant management and epidural placement.    Genia Del, CNM 01/31/2018, 3:51 PM

## 2018-01-31 NOTE — Progress Notes (Signed)
Labor Progress Note  Gloria [name] is a 37 y.o. G7P1006 at [redacted]w[redacted]d by ultrasound admitted for preterm labor.  Subjective:  Patient reports feeling contractions more regularly when she got up to shower. Uncomfortable with contractions.   Objective: BP 122/69   Pulse (!) 113   Temp 98 F (36.7 C) (Oral)   Resp 16   LMP 06/08/2017   SpO2 96%   Fetal Assessment: FHT:  FHR: 150 bpm, variability: moderate,  accelerations:  Present,  decelerations:  Present variable Category/reactivity:  Category II UC:   Irregular, currently every 4-5 minutes SVE:    Dilation: 3.5cm  Effacement: 70%  Station:  -3  Consistency: soft  Position: posterior  Membrane status: intact Amniotic color: n/a  Labs: Lab Results  Component Value Date   WBC 17.5 (H) 01/31/2018   HGB 10.8 (L) 01/31/2018   HCT 32.3 (L) 01/31/2018   MCV 91.5 01/31/2018   PLT 189 01/31/2018    Assessment / Plan: Preterm labor  Labor: s/p 4 doses of terbutaline, continue to give q3h for 48 hours total as indicated for tocolysis; no cervical change since this morning Fetal Wellbeing:  Category II for rare variable deceleration, overall reassuring Anticipated MOD:  NSVD   Genia Del, CNM 01/31/2018, 12:46 PM

## 2018-01-31 NOTE — OB Triage Note (Signed)
Recvd pt from ED. PT states contractions have not stopped since she was seen yesterday. States they are about every 8 min and got worse around 0000. Feeling baby move well, but not quite as much. No LOF or vaginal bleeding.

## 2018-01-31 NOTE — H&P (Addendum)
OB History & Physical   History of Present Illness:  Chief Complaint:   HPI:  Gloria [name] is a 37 y.o. G26P1006 female at [redacted]w[redacted]d dated by [redacted]w[redacted]d ultrasound.  She presents to L&D with reports of contractions. She was seen in triage yesterday and discharged home after tocolysis with nifedipine and terbutaline. Her contractions were occasional at the time that she left, but she reports that they became more regular once she got home. They started feeling stronger and more regular around midnight.   She reports:  -active fetal movement -no leakage of fluid -onset of contractions at 0230 currently every 10  Pregnancy Issues: 1. Subchorionic hemorrhage, stable in size, bleeding in first trimester, now resolved 2. AMA, 36 at time of delivery 3. History of preterm delivery at 34-35 weeks with G1, declines 17-OHP 4. History of postpartum pre-eclampsia with G4, hospitalized and placed on diuretics for 6 weeks 5. Preterm contractions in third trimester 6. GBS positive  Maternal Medical History:   Past Medical History:  Diagnosis Date  . Pregnancy induced hypertension   . Preterm labor   . Vaginal Pap smear, abnormal     History reviewed. No pertinent surgical history.  No Known Allergies  Prior to Admission medications   Medication Sig Start Date End Date Taking? Authorizing Provider  Prenatal Vit-Fe Fumarate-FA (PRENATAL MULTIVITAMIN) TABS tablet Take 1 tablet by mouth daily at 12 noon.   Yes [provider]     Prenatal care site: [facility]   Social History: She  reports that she has been smoking. She has never used smokeless tobacco. She reports that she does not drink alcohol or use drugs.  Family History: family history includes Arthritis in her father and paternal grandmother; Cancer in her maternal grandfather; Diabetes in her mother and paternal grandmother.   Review of Systems: A full review of systems was performed and negative except as noted in the  HPI.    Physical Exam:  Vital Signs: BP 122/69 (BP Location: Left Arm)   Pulse (!) 107   Temp 98 F (36.7 C) (Oral)   Resp 16   LMP 06/08/2017   General:   alert, cooperative, appears stated age and moderate distress  Skin:  normal and no rash or abnormalities  Neurologic:    Alert & oriented x 3  Lungs:   clear to auscultation bilaterally  Heart:   regular rate and rhythm, S1, S2 normal, no murmur, click, rub or gallop  Abdomen:  soft, non-tender; bowel sounds normal; no masses,  no organomegaly  FHT:  140 BPM  Presentations: cephalic  Cervix:     Dilation: 3cm   Effacement: 70-80%   Station:  -3  Extremities: : non-tender, symmetric, no edema bilaterally.    Results for orders placed or performed during the hospital encounter of 01/30/18 (from the past 24 hour(s))  ROM Plus ([facility] only)     Status: None   Collection Time: 01/30/18  9:41 AM  Result Value Ref Range   Rom Plus NEGATIVE   Wet prep, genital     Status: Abnormal   Collection Time: 01/30/18  9:41 AM  Result Value Ref Range   Yeast Wet Prep HPF POC NONE SEEN NONE SEEN   Trich, Wet Prep NONE SEEN NONE SEEN   Clue Cells Wet Prep HPF POC NONE SEEN NONE SEEN   WBC, Wet Prep HPF POC MANY (A) NONE SEEN   Sperm NONE SEEN     Pertinent Results:  Prenatal Labs:  Blood type/Rh A+  Antibody screen neg  Rubella Immune  Varicella Immune  RPR NR  HBsAg Neg  HIV NR  GC neg  Chlamydia neg  Genetic screening cfDNA negative, female fetus  1 hour GTT Not performed  3 hour GTT n/a  GBS positive   FHT: FHR: 135 bpm, variability: moderate,  accelerations:  Present,  decelerations:  Absent Category/reactivity:  Category I TOCO: regular, every 3-5 minutes  Assessment:  Gloria [name] is a 37 y.o. G5P1006 female at [redacted]w[redacted]d with preterm labor   Plan:  1. Admit to L&D.   2. Fetal Well being: - Fetal Tracing: category I - GBS positive, prophylaxis with penicillin ordered if contractions persist - Presentation: cephalic  confirmed by vaginal exam  3. Preterm labor with advanced cervical dilation: - Discussed with Dr. Elesa Massed. May offer tocolysis with nifedipine or terbutaline versus expectant management. Patient desires tocolysis.  - Start 0.25mg  terbutaline q9m for up to 4 doses for tocolysis, followed by administration q3-4 hours.  - Cervix more effaced and softer than on exam on discharge yesterday.  - Contractions monitored with external toco in place - Plan for continuous fetal monitoring   4. Routine OB: - Prenatal labs reviewed, as above - Rh positive - CBC & T&S on admit - Regular diet, IVF  5. Post Partum Planning: - Contraception: BTL   Genia Del, CNM 01/31/2018 3:22 AM ----- Genia Del Certified Nurse Midwife [facility], Department of OB/GYN [facility]

## 2018-01-31 NOTE — Progress Notes (Signed)
Labor Progress Note  Gloria [name] is a 37 y.o. G7P1006 at [redacted]w[redacted]d by ultrasound admitted for preterm labor.   Subjective:  Slept through a number of contractions this morning, but has had some strong contractions since waking up.   Objective: BP 122/69   Pulse (!) 113   Temp 98 F (36.7 C) (Oral)   Resp 16   LMP 06/08/2017   SpO2 94%   Fetal Assessment: FHT:  FHR: 150 bpm, variability: moderate,  accelerations:  Present,  decelerations:  Present variable Category/reactivity:  Category II UC:   irregular, every 10-12 minutes SVE:  Deferred Membrane status: intact Amniotic color: n/a  Labs: Lab Results  Component Value Date   WBC 17.5 (H) 01/31/2018   HGB 10.8 (L) 01/31/2018   HCT 32.3 (L) 01/31/2018   MCV 91.5 01/31/2018   PLT 189 01/31/2018    Assessment / Plan: Preterm labor  Labor: s/p 4 doses of terbutaline, continue to give q4h for 48 hours total as indicated for tocolysis Fetal Wellbeing:  Category II for rare variable deceleration, overall reassuring Anticipated MOD:  NSVD   Discussed with Dr. Ranae Plumber, who is in agreement with this plan.   Gloria [name], CNM 01/31/2018, 9:10 AM

## 2018-02-01 LAB — CBC
HCT: 30.6 % — ABNORMAL LOW (ref 36.0–46.0)
HEMOGLOBIN: 10.3 g/dL — AB (ref 12.0–15.0)
MCH: 30.7 pg (ref 26.0–34.0)
MCHC: 33.7 g/dL (ref 30.0–36.0)
MCV: 91.3 fL (ref 80.0–100.0)
Platelets: 203 10*3/uL (ref 150–400)
RBC: 3.35 MIL/uL — AB (ref 3.87–5.11)
RDW: 12.9 % (ref 11.5–15.5)
WBC: 16.3 10*3/uL — AB (ref 4.0–10.5)
nRBC: 0 % (ref 0.0–0.2)

## 2018-02-01 LAB — RPR: RPR Ser Ql: NONREACTIVE

## 2018-02-01 NOTE — Progress Notes (Signed)
Post Partum Day 1 Subjective: Pumping breasts and doing okay, wants IV out  Objective: Blood pressure 121/63, pulse 85, temperature 98.2 F (36.8 C), temperature source Oral, resp. rate 18, last menstrual period 06/08/2017, SpO2 97 %, unknown if currently breastfeeding.  Physical Exam:  General: alert and cooperative Lochia: mod, passed 2 clots last pm and none now Uterine Fundus: firm, U-1 Incision: None DVT Evaluation: No evidence of DVT seen on physical exam.  Recent Labs    01/31/18 0526 02/01/18 0508  HGB 10.8* 10.3*  HCT 32.3* 30.6*    Assessment/Plan: Plan for discharge tomorrow  OTC Fe Baby in NICU doing well   LOS: 1 day   Sharee Pimple 02/01/2018, 8:49 AM

## 2018-02-01 NOTE — Progress Notes (Signed)
Bleeding WDL, fundus firm. Pitocin stopped and IV removed per Milon Score CNM.   Patient updated on current plan of care for today. Given breast milk labels for pumped milk and reviewed and given supplies to clean pump parts.   Patient walked to SCN to visit baby at 0850 am.

## 2018-02-01 NOTE — Lactation Note (Signed)
Lactation Consultation Note  Patient Name: AEVAH STANSBERY ZDGUY'Q Date: 02/01/2018     Maternal Data  Mom breastfed her other 6 children, states she lives in [place], but uses [place] [facility], had an appt to sign up but missed appt, I [place]ed [facility] [place], a referral form for need for an electric breast pump post D/C  Mom is pumping her breasts every 3 hrs and obtains small amts approx.,5 cc Feeding    LATCH Score                   Interventions    Lactation Tools Discussed/Used     Consult Status      Dyann Kief 02/01/2018, 12:45 PM

## 2018-02-02 ENCOUNTER — Ambulatory Visit: Payer: Self-pay

## 2018-02-02 NOTE — Clinical Social Work Note (Signed)
CSW spoke with nursing today and was informed that patient's husband has been in the room with her all day. CSW informed by NICU staff that patient's husband arrived this morning and was angry once again over concerns from last evening. CSW will see patient tomorrow regardless of whether or not patient's husband is in the room. York SpanielMonica Irlene [name] MSW,LcSW [phone]

## 2018-02-02 NOTE — Lactation Note (Signed)
This note was copied from a baby's chart. Lactation Consultation Note  Patient Name: Gloria Juel Burrowmily Sze WUJWJ'XToday's Date: 02/02/2018    While I made second LC rounds today, I was told by RN Eileen StanfordJenna that mom has declined help from anyone, including lactation with the breastfeeds. LC contact in patient room if she changes her mind.    Maternal Data    Feeding Feeding Type: Formula  LATCH Score                   Interventions    Lactation Tools Discussed/Used     Consult Status      Gloria [name] 02/02/2018, 4:15 PM

## 2018-02-02 NOTE — Lactation Note (Signed)
Lactation Consultation Note  Patient Name: Gloria [name] Hence ZOXWR'UToday's Date: 02/02/2018   During LC rounds this morning, I relayed to parents that [facility] on any day she wants to obtain pump. She verbalized being pleased about this. I reviewed pumping frequency and techniques for optimal milk supply. We also discussed hands free pump bra to assist with her pumping. She denied having any other questions or needs. She has LC Designer, [employer] info.      Maternal Data    Feeding    LATCH Score                   Interventions    Lactation Tools Discussed/Used     Consult Status      Sunday CornSandra Clark [name] 02/02/2018, 10:41 AM

## 2018-02-02 NOTE — Progress Notes (Signed)
Mom ready for discharge.  Instructions reviewed and f/u care reviewed.  [facility]  Instr given to parents about this process and that mom will not receive Nursing Care since she is discharged.  Parents verb u/o.

## 2018-02-03 LAB — SURGICAL PATHOLOGY

## 2018-03-24 NOTE — L&D Delivery Note (Signed)
Date of delivery: 12/29/18 Estimated Date of Delivery: 01/15/19 Patient's last menstrual period was 04/10/2018. EGA: [redacted]w[redacted]d  Anesthesia:  None Episiotomy: None Lacerations: None Suture Repair: n/a Est. Blood Loss (mL): 100  Mom to postpartum.  Baby to Couplet care / Skin to Skin.   Delivery Note Pt presented to L&D with advanced dilation and GBS Pos, she was treated with 2 doses of PCN for GBS prophy and augmented with AROM.  Labor support used for pain management. Pt progressed to complete, +2. With one push fetus rotated and delivery of fetal head in ROA position with restitution to ROT.  Anterior then posterior shoulders delivered easily with gentle downward traction.  Baby placed on mom's chest, and attended to by peds.  Cord was doubly clamped and cut by FOB when pulseless.  Placenta spontaneously delivered, intact.  IV pitocin given for hemorrhage prophylaxis. Perineum, vagina and cervix were inspected and noted to be intact. Fundus was noted to be firm with small amt lochia. Mother and infant remain in birthing suite in stable condition.   Gloria [name] 12/29/2018, 10:25 PM

## 2018-06-07 ENCOUNTER — Encounter (HOSPITAL_COMMUNITY): Payer: Self-pay

## 2018-06-24 ENCOUNTER — Other Ambulatory Visit: Payer: Self-pay | Admitting: Obstetrics and Gynecology

## 2018-06-24 DIAGNOSIS — Z369 Encounter for antenatal screening, unspecified: Secondary | ICD-10-CM

## 2018-07-05 ENCOUNTER — Telehealth: Payer: Self-pay | Admitting: Obstetrics and Gynecology

## 2018-07-05 NOTE — Telephone Encounter (Signed)
Referring physician:  Heloise Ochoa, [facility] Telephone consult:  10 minutes  Ms. Himmelsbach was scheduled for genetic counseling and an ultrasound at [facility] on Thursday, 07/08/18.  Upon reviewing her records and given the recommendations to minimize clinic visits, it was noted that Ms. Isgett had genetic counseling for the same indication (Advanced Maternal Age) in 2019 during her last pregnancy.  At that time, no significant concerns were noted in the family history. I called her today to offer telephone consultation and review testing availability.  We spoke for approximately 10 minutes.  She reported no changes in the family history other than the birth of her son.  She now has 5 sons and two daughters, all of whom are reported to be in good health.  In this pregnancy, there has been a subchorionic hemmorhage noted on ultrasound but otherwise no complications.  She currently is smoking 3-4 cigarettes per day.  We reviewed the importance of cutting back and stopping smoking during pregnancy, as it is known to be associated with low birth weight, preterm delivery and poor pregnancy outcomes.    We reviewed testing and screening options and she stated that she would like the MaterniT21 testing as she had in the last pregnancy.  This can be performed after [redacted] weeks gestation.  She reported that she has a lab only visit at [facility] this week.  I emailed Rebecca McVey and [facility] and requested that orders be placed for the MaterniT21 PLUS with [facility] ([facility] #[id number]) at that time.  If this cannot be done, I asked that they let me know and I will arrange for her to go to a [facility] patient service center for the testing.  Both of the appointments at [facility] will be cancelled for 07/08/18.  I will follow up with the patient regarding the results of her labs when they are complete.  Cherly Anderson, MS, CGC

## 2018-07-06 LAB — OB RESULTS CONSOLE RUBELLA ANTIBODY, IGM: Rubella: IMMUNE

## 2018-07-06 LAB — OB RESULTS CONSOLE HEPATITIS B SURFACE ANTIGEN: Hepatitis B Surface Ag: NEGATIVE

## 2018-07-06 LAB — OB RESULTS CONSOLE VARICELLA ZOSTER ANTIBODY, IGG: Varicella: IMMUNE

## 2018-07-08 ENCOUNTER — Ambulatory Visit: Payer: Medicaid Other

## 2018-07-12 ENCOUNTER — Telehealth: Payer: Self-pay | Admitting: Obstetrics and Gynecology

## 2018-07-12 NOTE — Telephone Encounter (Signed)
The patient was informed of the results of her recent MaterniT21 testing which yielded NEGATIVE results.  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  The sensitivity for trisomy 74, trisomy 2 and trisomy 74 using this testing are reported as 99.1%, 99.9% and 91.7% respectively.  Thus, while the results of this testing are highly accurate, they are not considered diagnostic at this time.  Should more definitive information be desired, the patient may still consider amniocentesis.   As requested to know by the patient, sex chromosome analysis was included for this sample.  Results are consistent with a female fetus (Y chromosome material was present). This is predicted with >99% accuracy.  A maternal serum AFP only should be considered if screening for neural tube defects is desired.  We may be reached at [phone] with any questions or concerns.   Cherly Anderson, MS, CGC *note - this lab was drawn at [facility] on 07/06/18 so the patient did not need to come into the [facility].

## 2018-12-23 LAB — OB RESULTS CONSOLE GBS: GBS: POSITIVE

## 2018-12-23 LAB — OB RESULTS CONSOLE RPR: RPR: NONREACTIVE

## 2018-12-23 LAB — OB RESULTS CONSOLE HIV ANTIBODY (ROUTINE TESTING): HIV: NONREACTIVE

## 2018-12-29 ENCOUNTER — Other Ambulatory Visit: Payer: Self-pay

## 2018-12-29 ENCOUNTER — Inpatient Hospital Stay
Admission: EM | Admit: 2018-12-29 | Discharge: 2018-12-31 | DRG: 798 | Disposition: A | Discharge status: Home / Self Care | Payer: Medicaid Other | Attending: Obstetrics and Gynecology | Admitting: Obstetrics and Gynecology

## 2018-12-29 DIAGNOSIS — Z302 Encounter for sterilization: Secondary | ICD-10-CM | POA: Diagnosis not present

## 2018-12-29 DIAGNOSIS — Z3A37 37 weeks gestation of pregnancy: Secondary | ICD-10-CM | POA: Diagnosis not present

## 2018-12-29 DIAGNOSIS — Z20828 Contact with and (suspected) exposure to other viral communicable diseases: Secondary | ICD-10-CM | POA: Diagnosis present

## 2018-12-29 DIAGNOSIS — O26893 Other specified pregnancy related conditions, third trimester: Secondary | ICD-10-CM | POA: Diagnosis present

## 2018-12-29 DIAGNOSIS — O99334 Smoking (tobacco) complicating childbirth: Secondary | ICD-10-CM | POA: Diagnosis present

## 2018-12-29 DIAGNOSIS — O99824 Streptococcus B carrier state complicating childbirth: Principal | ICD-10-CM | POA: Diagnosis present

## 2018-12-29 DIAGNOSIS — F17200 Nicotine dependence, unspecified, uncomplicated: Secondary | ICD-10-CM | POA: Diagnosis present

## 2018-12-29 LAB — TYPE AND SCREEN
ABO/RH(D): A POS
Antibody Screen: NEGATIVE

## 2018-12-29 LAB — CBC
HCT: 34.2 % — ABNORMAL LOW (ref 36.0–46.0)
Hemoglobin: 11.6 g/dL — ABNORMAL LOW (ref 12.0–15.0)
MCH: 29.9 pg (ref 26.0–34.0)
MCHC: 33.9 g/dL (ref 30.0–36.0)
MCV: 88.1 fL (ref 80.0–100.0)
Platelets: 287 10*3/uL (ref 150–400)
RBC: 3.88 MIL/uL (ref 3.87–5.11)
RDW: 13.1 % (ref 11.5–15.5)
WBC: 11.2 10*3/uL — ABNORMAL HIGH (ref 4.0–10.5)
nRBC: 0 % (ref 0.0–0.2)

## 2018-12-29 LAB — SARS CORONAVIRUS 2 BY RT PCR (HOSPITAL ORDER, PERFORMED IN ~~LOC~~ HOSPITAL LAB): SARS Coronavirus 2: NEGATIVE

## 2018-12-29 MED ORDER — PENICILLIN G 3 MILLION UNITS IVPB - SIMPLE MED
3.0000 10*6.[IU] | INTRAVENOUS | Status: DC
  Administered 2018-12-29: 20:00:00 3 10*6.[IU] via INTRAVENOUS
  Filled 2018-12-29 (×4): qty 100

## 2018-12-29 MED ORDER — SOD CITRATE-CITRIC ACID 500-334 MG/5ML PO SOLN: 30.0000 mL | ORAL | Status: DC | PRN

## 2018-12-29 MED ORDER — OXYTOCIN 40 UNITS IN NORMAL SALINE INFUSION - SIMPLE MED
2.5000 [IU]/h | INTRAVENOUS | Status: DC
  Filled 2018-12-29: qty 1000

## 2018-12-29 MED ORDER — OXYTOCIN BOLUS FROM INFUSION
500.0000 mL | Freq: Once | INTRAVENOUS | Status: AC
  Administered 2018-12-29: 500 mL via INTRAVENOUS

## 2018-12-29 MED ORDER — FENTANYL CITRATE (PF) 100 MCG/2ML IJ SOLN: 100.0000 ug | INTRAMUSCULAR | Status: DC | PRN

## 2018-12-29 MED ORDER — IBUPROFEN 600 MG PO TABS
600.0000 mg | ORAL_TABLET | Freq: Four times a day (QID) | ORAL | Status: DC
  Administered 2018-12-29 – 2018-12-31 (×4): 600 mg via ORAL
  Filled 2018-12-29 (×3): qty 1

## 2018-12-29 MED ORDER — MISOPROSTOL 200 MCG PO TABS
ORAL_TABLET | ORAL | Status: AC
  Filled 2018-12-29: qty 4

## 2018-12-29 MED ORDER — SODIUM CHLORIDE 0.9 % IV SOLN
5.0000 10*6.[IU] | Freq: Once | INTRAVENOUS | Status: AC
  Administered 2018-12-29: 16:00:00 5 10*6.[IU] via INTRAVENOUS
  Filled 2018-12-29: qty 5

## 2018-12-29 MED ORDER — ACETAMINOPHEN 325 MG PO TABS: 650.0000 mg | ORAL_TABLET | ORAL | Status: DC | PRN

## 2018-12-29 MED ORDER — IBUPROFEN 600 MG PO TABS
ORAL_TABLET | ORAL | Status: AC
  Filled 2018-12-29: qty 1

## 2018-12-29 MED ORDER — LACTATED RINGERS IV SOLN
500.0000 mL | INTRAVENOUS | Status: DC | PRN
  Administered 2018-12-29: 17:00:00 250 mL via INTRAVENOUS

## 2018-12-29 MED ORDER — ONDANSETRON HCL 4 MG/2ML IJ SOLN: 4.0000 mg | Freq: Four times a day (QID) | INTRAMUSCULAR | Status: DC | PRN

## 2018-12-29 MED ORDER — LIDOCAINE HCL (PF) 1 % IJ SOLN
30.0000 mL | INTRAMUSCULAR | Status: DC | PRN
  Filled 2018-12-29: qty 30

## 2018-12-29 MED ORDER — LACTATED RINGERS IV SOLN
INTRAVENOUS | Status: DC
  Administered 2018-12-29: 15:00:00 via INTRAVENOUS

## 2018-12-29 MED ORDER — BENZOCAINE-MENTHOL 20-0.5 % EX AERO
1.0000 "application " | INHALATION_SPRAY | CUTANEOUS | Status: DC | PRN
  Administered 2018-12-30: 1 via TOPICAL
  Filled 2018-12-29: qty 56

## 2018-12-29 NOTE — Discharge Summary (Signed)
Obstetrical Discharge Summary  Patient Name: Gloria [name] DOB: [birth year] MRN: [id number]  Date of Admission: 12/29/2018 Date of Delivery: 12/29/18 Delivered by: Heloise Ochoa CNM Date of Discharge: 12/31/2018  Primary OB: [facility]  [id number]'P last menstrual period was 04/10/2018. EDC Estimated Date of Delivery: 01/15/19 Gestational Age at Delivery: [redacted]w[redacted]d   Antepartum complications:  1. Grand multip 2. GBS Pos  3. Hx precip labor and birth 73. Hx PTB x 2 (1st and 7th). 5. Hx Pre-e after 4th baby 6. Elevated 1h GTT, refused 3h GTT, random glucose 72 and A1C 5.2 on 10/1  Admitting Diagnosis: Advanced dilation, GBS Pos Secondary Diagnosis: SVD  Patient Active Problem List   Diagnosis Date Noted  . Labor and delivery, indication for care 12/29/2018  . Preterm delivery, delivered 02/02/2018  . Preterm labor in third trimester 01/31/2018  . Preterm uterine contractions in third trimester, antepartum 01/30/2018  . Preterm uterine contractions, antepartum, third trimester 01/23/2018  . SVD (spontaneous vaginal delivery) 08/16/2014  . Indication for care in labor or delivery 08/15/2014  . 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    Augmentation: AROM Complications: None Intrapartum complications/course: PCN x 2 doses, then AROM performed. Pt progressed spontaneously to delivery, see delivery note.  Date of Delivery: 12/29/18 Delivered by: Heloise Ochoa CNM Delivery Type: spontaneous vaginal delivery Anesthesia: epidural Placenta: spontaneous Laceration: none Episiotomy: none Newborn Data: Live born female  Birth Weight:  3240g APGAR: 8, 9  Newborn Delivery   Birth date/time: 12/29/2018 21:29:00 Delivery type: Vaginal, Spontaneous      Postpartum Procedures: Postpartum BTL  Post partum course:  Patient had an uncomplicated postpartum course.  By time of discharge on PPD#1, her pain was controlled on oral pain medications; she had appropriate lochia and was ambulating,  voiding without difficulty and tolerating regular diet.  She was deemed stable for discharge to home.     Discharge Physical Exam: 1 BP 129/76 (BP Location: Right Arm)   Pulse 75   Temp 98.3 F (36.8 C) (Oral)   Resp 18   Ht 5\' 3"  (1.6 m)   Wt 73 kg   LMP 04/10/2018   SpO2 98%   Breastfeeding Unknown   BMI 28.51 kg/m   General: NAD CV: RRR Pulm: CTABL, nl effort ABD: s/nd/nt, fundus firm and below the umbilicus Lochia: moderate Incision: well approximated/intact DVT Evaluation: LE non-ttp, no evidence of DVT on exam.  Hemoglobin  Date Value Ref Range Status  12/30/2018 11.0 (L) 12.0 - 15.0 g/dL Final   HGB  Date Value Ref Range Status  08/08/2013 13.6 12.0 - 16.0 g/dL Final   HCT  Date Value Ref Range Status  12/30/2018 33.1 (L) 36.0 - 46.0 % Final  08/10/2013 37.0 35.0 - 47.0 % Final     Disposition: stable, discharge to home. Baby Feeding: breastmilk and formula Baby Disposition: home with mom  Rh Immune globulin given: A Pos Rubella vaccine given: n/a Varicella vaccine given: n/a Tdap vaccine given in AP or PP setting: 09/2018 Flu vaccine given in AP or PP setting: declined  Contraception: s/p PP BTL  Prenatal Labs:  Blood type/Rh  A pos  Antibody screen neg  Rubella Immune  Varicella Immune  RPR NR  HBsAg Neg  HIV NR  GC neg  Chlamydia neg  Genetic screening negative  1 hour GTT  152  3 hour GTT  not done  GBS  Pos      Plan:  10/2018 was discharged to home in good condition. Follow-up  appointment with delivering provider in 6 weeks.  Discharge Medications: Allergies as of 12/31/2018   No Known Allergies     Medication List    TAKE these medications   ferrous sulfate 325 (65 FE) MG tablet Take 1 tablet (325 mg total) by mouth daily with breakfast. Take with Vitamin C   ibuprofen 800 MG tablet Commonly known as: ADVIL Take 1 tablet (800 mg total) by mouth every 8 (eight) hours as needed for moderate pain or cramping.    oxyCODONE-acetaminophen 5-325 MG tablet Commonly known as: PERCOCET/ROXICET Take 1-2 tablets by mouth every 6 (six) hours as needed for severe pain.   prenatal multivitamin Tabs tablet Take 1 tablet by mouth daily at 12 noon.       Follow-up Information    McVey, Murray Hodgkins, CNM Follow up in 6 week(s).   Specialty: Obstetrics and Gynecology Why: postpartum Contact information: [address] [place] [address] [phone]           Signed:  Benjaman Kindler, MD 12/31/2018  9:05 AM

## 2018-12-29 NOTE — OB Triage Note (Signed)
Patient sent  to L&D from [facility] office for IOL for advanced cervical dilation and bulging bag of membranes,  Patient is GBS positive and will need treatment before delivery

## 2018-12-29 NOTE — Progress Notes (Signed)
S: to room for prolonged FHR decel. Pt sitting up in bed, c/o having "long painful UC".   O: Blood pressure 129/74, pulse (!) 101, temperature 98.2 F (36.8 C), temperature source Oral, resp. rate 18, height 5\' 3"  (1.6 m), weight 73 kg, unknown if currently breastfeeding.  FHR: prlonged decel to 60bpm x 47min. Resolved with pt position change to left lateral. Now FHR at baseline 120bpm, moderate variability.   TOco: Irregular UCs, q3-29min, lasting 40-150sec long.   SVE 5cm, bloody show.    A: G8 P5207 at 37.4wks prolonged decel, now resolved.   P: will continue to monitor closely  Francetta Found, CNM 12/29/2018  5:17 PM

## 2018-12-29 NOTE — Progress Notes (Signed)
Labor Progress Note  Gloria [name] is a 38 y.o. P3A2505 at [redacted]w[redacted]d by LMP admitted for active labor, with advanced cervical dilation and GBS pos.   Subjective: feeling stronger UCs.   Objective: BP 120/68   Pulse 91   Temp 97.8 F (36.6 C) (Oral)   Resp 16   Ht 5\' 3"  (1.6 m)   Wt 73 kg   BMI 28.52 kg/m  Notable VS details: reviewed  Fetal Assessment: FHT:  FHR: 120 bpm, variability: moderate,  accelerations:  Present,  decelerations:  Absent   Category/reactivity:  Category I UC:   irregular, every 2-7 minutes SVE:   6/75/-2, BBOW with bloody show noted, AROM performed, copious amount of clear fluid with good fetal descent and well applied to cervix.  - variable decels noted after AROM, FSE applied.   Membrane status: AROM at 1933 Amniotic color: clear  Labs: Lab Results  Component Value Date   WBC 11.2 (H) 12/29/2018   HGB 11.6 (L) 12/29/2018   HCT 34.2 (L) 12/29/2018   MCV 88.1 12/29/2018   PLT 287 12/29/2018    Assessment / Plan: Augmentation of labor, GBS Pos, G8 P5207 at 37+4wks  Labor: Progressing normally Preeclampsia:  no e/o pre-e Fetal Wellbeing:  Category II Pain Control:  Labor support without medications I/D:  s/p 2 doses of PCN for GBS.  Anticipated MOD:  NSVD  Francetta Found, CNM 12/29/2018, 8:31 PM

## 2018-12-29 NOTE — H&P (Signed)
OB History & Physical   History of Present Illness:  Chief Complaint: advanced dilation with contractions  HPI:  Gloria [name] is a 38 y.o. F1M3846 female at [redacted]w[redacted]d dated by LMP.  She presents to L&D for irregular UCs with bloody show and active FM. Has not noted LOF.   Pregnancy Issues: 1. Grand multip 2. GBS Pos  3. Hx precip labor and birth 48. Hx PTB x 2 (1st and 7th). 5. Hx Pre-e after 4th baby 6. Elevated 1h GTT, refused 3h GTT, random glucose 72 and A1C 5.2 on 10/1  Maternal Medical History:   Past Medical History:  Diagnosis Date  . Pregnancy induced hypertension   . Preterm labor   . Vaginal Pap smear, abnormal     History reviewed. No pertinent surgical history.  No Known Allergies  Prior to Admission medications   Medication Sig Start Date End Date Taking? Authorizing Provider  Prenatal Vit-Fe Fumarate-FA (PRENATAL MULTIVITAMIN) TABS tablet Take 1 tablet by mouth daily at 12 noon.    [provider]     Prenatal care site: [facility]  Social History: She  reports that she has been smoking. She has never used smokeless tobacco. She reports that she does not drink alcohol or use drugs.  Family History: family history includes Arthritis in her father and paternal grandmother; Cancer in her maternal grandfather; Diabetes in her mother and paternal grandmother.   Review of Systems: A full review of systems was performed and negative except as noted in the HPI.     Physical Exam:  Vital Signs: BP 129/74   Pulse (!) 101   Temp 98.2 F (36.8 C) (Oral)   Resp 18   Ht 5\' 3"  (1.6 m)   Wt 73 kg   BMI 28.52 kg/m  General: no acute distress.  HEENT: normocephalic, atraumatic Heart: regular rate & rhythm.  No murmurs/rubs/gallops Lungs: clear to auscultation bilaterally, normal respiratory effort Abdomen: soft, gravid, non-tender;  EFW: 7lbs Pelvic:   External: Normal external female genitalia  Cervix:5/80/-2 with BBOW and bloody show (exam  in office at 2.15pm)   Extremities: non-tender, symmetric, no edema bilaterally.  DTRs: 2+  Neurologic: Alert & oriented x 3.    Results for orders placed or performed during the hospital encounter of 12/29/18 (from the past 24 hour(s))  CBC     Status: Abnormal   Collection Time: 12/29/18  3:20 PM  Result Value Ref Range   WBC 11.2 (H) 4.0 - 10.5 K/uL   RBC 3.88 3.87 - 5.11 MIL/uL   Hemoglobin 11.6 (L) 12.0 - 15.0 g/dL   HCT 02/28/19 (L) 65.9 - 93.5 %   MCV 88.1 80.0 - 100.0 fL   MCH 29.9 26.0 - 34.0 pg   MCHC 33.9 30.0 - 36.0 g/dL   RDW 70.1 77.9 - 39.0 %   Platelets 287 150 - 400 K/uL   nRBC 0.0 0.0 - 0.2 %  Type and screen [facility]     Status: None   Collection Time: 12/29/18  3:20 PM  Result Value Ref Range   ABO/RH(D) A POS    Antibody Screen NEG    Sample Expiration      01/01/2019,2359 Performed at [facility], [address] [place], [place]     Pertinent Results:  Prenatal Labs: Blood type/Rh  A pos  Antibody screen neg  Rubella Immune  Varicella Immune  RPR NR  HBsAg Neg  HIV NR  GC neg  Chlamydia neg  Genetic screening negative  1 hour GTT  152  3 hour GTT  not done  GBS  Pos   FHT: 120bpm, moderate variability, + accels, no decels TOCO: q5-65min, palp mod  Cephalic by leopolds  No results found.  Assessment:  Gloria [name] is a 38 y.o. K4M0102 female at [redacted]w[redacted]d with early labor and advanced dilation.   Plan:  1. Admit to Labor & Delivery; consents reviewed and obtained - COVID test on admit - plan augmentation after GBS prophy  2. Fetal Well being  - Fetal Tracing: Cat I tracing - Group B Streptococcus ppx indicated: Pos - Presentation: cephalic confirmed by exam   3. Routine OB: - Prenatal labs reviewed, as above - Rh A Pos - CBC, T&S, RPR on admit - Clear fluids, IVF  4. Monitoring of Labor -  Contractions: external toco in place -  Pelvis proven to 8#6 -  Plan for augmentation with  AROM -  Plan for continuous fetal monitoring  -  Maternal pain control as desired - Anticipate vaginal delivery  5. Post Partum Planning: - Infant feeding: breast and formula - Contraception: [place], CNM 12/29/18 4:38 PM

## 2018-12-30 ENCOUNTER — Inpatient Hospital Stay: Payer: Medicaid Other | Admitting: Anesthesiology

## 2018-12-30 ENCOUNTER — Encounter: Payer: Self-pay | Admitting: Anesthesiology

## 2018-12-30 ENCOUNTER — Encounter
Admission: EM | Disposition: A | Discharge status: Home / Self Care | Payer: Self-pay | Source: Home / Self Care | Attending: Obstetrics and Gynecology

## 2018-12-30 HISTORY — PX: TUBAL LIGATION: SHX77

## 2018-12-30 LAB — RPR: RPR Ser Ql: NONREACTIVE

## 2018-12-30 LAB — CBC
HCT: 33.1 % — ABNORMAL LOW (ref 36.0–46.0)
Hemoglobin: 11 g/dL — ABNORMAL LOW (ref 12.0–15.0)
MCH: 29.9 pg (ref 26.0–34.0)
MCHC: 33.2 g/dL (ref 30.0–36.0)
MCV: 89.9 fL (ref 80.0–100.0)
Platelets: 253 10*3/uL (ref 150–400)
RBC: 3.68 MIL/uL — ABNORMAL LOW (ref 3.87–5.11)
RDW: 12.8 % (ref 11.5–15.5)
WBC: 16.1 10*3/uL — ABNORMAL HIGH (ref 4.0–10.5)
nRBC: 0 % (ref 0.0–0.2)

## 2018-12-30 SURGERY — LIGATION, FALLOPIAN TUBE, POSTPARTUM
Anesthesia: General | Site: Abdomen | Laterality: Bilateral

## 2018-12-30 MED ORDER — FAMOTIDINE 20 MG PO TABS: 40.0000 mg | ORAL_TABLET | Freq: Once | ORAL | Status: DC

## 2018-12-30 MED ORDER — ONDANSETRON HCL 4 MG/2ML IJ SOLN: 4.0000 mg | Freq: Once | INTRAMUSCULAR | Status: DC | PRN

## 2018-12-30 MED ORDER — PROPOFOL 10 MG/ML IV BOLUS
INTRAVENOUS | Status: DC | PRN
  Administered 2018-12-30: 150 mg via INTRAVENOUS

## 2018-12-30 MED ORDER — FENTANYL CITRATE (PF) 100 MCG/2ML IJ SOLN
INTRAMUSCULAR | Status: AC
  Filled 2018-12-30: qty 2

## 2018-12-30 MED ORDER — SUGAMMADEX SODIUM 200 MG/2ML IV SOLN
INTRAVENOUS | Status: DC | PRN
  Administered 2018-12-30: 146 mg via INTRAVENOUS

## 2018-12-30 MED ORDER — ROCURONIUM BROMIDE 100 MG/10ML IV SOLN
INTRAVENOUS | Status: DC | PRN
  Administered 2018-12-30: 15 mg via INTRAVENOUS
  Administered 2018-12-30: 5 mg via INTRAVENOUS

## 2018-12-30 MED ORDER — PRENATAL MULTIVITAMIN CH: 1.0000 | ORAL_TABLET | Freq: Every day | ORAL | Status: DC

## 2018-12-30 MED ORDER — KETOROLAC TROMETHAMINE 30 MG/ML IJ SOLN
INTRAMUSCULAR | Status: AC
  Filled 2018-12-30: qty 1

## 2018-12-30 MED ORDER — BUPIVACAINE HCL 0.5 % IJ SOLN
INTRAMUSCULAR | Status: DC | PRN
  Administered 2018-12-30: 10 mL

## 2018-12-30 MED ORDER — ONDANSETRON HCL 4 MG/2ML IJ SOLN: 4.0000 mg | INTRAMUSCULAR | Status: DC | PRN

## 2018-12-30 MED ORDER — DIPHENHYDRAMINE HCL 25 MG PO CAPS: 25.0000 mg | ORAL_CAPSULE | Freq: Four times a day (QID) | ORAL | Status: DC | PRN

## 2018-12-30 MED ORDER — SENNOSIDES-DOCUSATE SODIUM 8.6-50 MG PO TABS
2.0000 | ORAL_TABLET | ORAL | Status: DC
  Administered 2018-12-31: 2 via ORAL
  Filled 2018-12-30: qty 2

## 2018-12-30 MED ORDER — METOCLOPRAMIDE HCL 10 MG PO TABS: 10.0000 mg | ORAL_TABLET | Freq: Once | ORAL | Status: DC

## 2018-12-30 MED ORDER — FENTANYL CITRATE (PF) 100 MCG/2ML IJ SOLN
25.0000 ug | INTRAMUSCULAR | Status: DC | PRN
  Administered 2018-12-30 (×4): 25 ug via INTRAVENOUS

## 2018-12-30 MED ORDER — ONDANSETRON HCL 4 MG/2ML IJ SOLN
INTRAMUSCULAR | Status: DC | PRN
  Administered 2018-12-30: 4 mg via INTRAVENOUS

## 2018-12-30 MED ORDER — DEXAMETHASONE SODIUM PHOSPHATE 10 MG/ML IJ SOLN
INTRAMUSCULAR | Status: DC | PRN
  Administered 2018-12-30: 10 mg via INTRAVENOUS

## 2018-12-30 MED ORDER — OXYCODONE HCL 5 MG PO TABS: 10.0000 mg | ORAL_TABLET | ORAL | Status: DC | PRN

## 2018-12-30 MED ORDER — MIDAZOLAM HCL 2 MG/2ML IJ SOLN
INTRAMUSCULAR | Status: DC | PRN
  Administered 2018-12-30: 2 mg via INTRAVENOUS

## 2018-12-30 MED ORDER — SUCCINYLCHOLINE CHLORIDE 20 MG/ML IJ SOLN
INTRAMUSCULAR | Status: DC | PRN
  Administered 2018-12-30: 140 mg via INTRAVENOUS

## 2018-12-30 MED ORDER — FENTANYL CITRATE (PF) 100 MCG/2ML IJ SOLN
INTRAMUSCULAR | Status: AC
  Administered 2018-12-30: 25 ug via INTRAVENOUS
  Filled 2018-12-30: qty 2

## 2018-12-30 MED ORDER — DEXMEDETOMIDINE HCL 200 MCG/2ML IV SOLN
INTRAVENOUS | Status: DC | PRN
  Administered 2018-12-30: 8 ug via INTRAVENOUS

## 2018-12-30 MED ORDER — FENTANYL CITRATE (PF) 100 MCG/2ML IJ SOLN
INTRAMUSCULAR | Status: DC | PRN
  Administered 2018-12-30: 100 ug via INTRAVENOUS
  Administered 2018-12-30 (×2): 50 ug via INTRAVENOUS

## 2018-12-30 MED ORDER — WITCH HAZEL-GLYCERIN EX PADS: 1.0000 "application " | MEDICATED_PAD | CUTANEOUS | Status: DC | PRN

## 2018-12-30 MED ORDER — SIMETHICONE 80 MG PO CHEW: 80.0000 mg | CHEWABLE_TABLET | ORAL | Status: DC | PRN

## 2018-12-30 MED ORDER — ONDANSETRON HCL 4 MG PO TABS: 4.0000 mg | ORAL_TABLET | ORAL | Status: DC | PRN

## 2018-12-30 MED ORDER — SUGAMMADEX SODIUM 200 MG/2ML IV SOLN
INTRAVENOUS | Status: AC
  Filled 2018-12-30: qty 2

## 2018-12-30 MED ORDER — OXYCODONE HCL 5 MG PO TABS: 5.0000 mg | ORAL_TABLET | ORAL | Status: DC | PRN

## 2018-12-30 MED ORDER — DIBUCAINE (PERIANAL) 1 % EX OINT: 1.0000 "application " | TOPICAL_OINTMENT | CUTANEOUS | Status: DC | PRN

## 2018-12-30 MED ORDER — ZOLPIDEM TARTRATE 5 MG PO TABS: 5.0000 mg | ORAL_TABLET | Freq: Every evening | ORAL | Status: DC | PRN

## 2018-12-30 MED ORDER — LIDOCAINE HCL (CARDIAC) PF 100 MG/5ML IV SOSY
PREFILLED_SYRINGE | INTRAVENOUS | Status: DC | PRN
  Administered 2018-12-30: 60 mg via INTRAVENOUS

## 2018-12-30 MED ORDER — COCONUT OIL OIL
1.0000 "application " | TOPICAL_OIL | Status: DC | PRN
  Filled 2018-12-30: qty 120

## 2018-12-30 MED ORDER — MIDAZOLAM HCL 2 MG/2ML IJ SOLN
INTRAMUSCULAR | Status: AC
  Filled 2018-12-30: qty 2

## 2018-12-30 MED ORDER — ACETAMINOPHEN 325 MG PO TABS
650.0000 mg | ORAL_TABLET | ORAL | Status: DC | PRN
  Administered 2018-12-30 – 2018-12-31 (×5): 650 mg via ORAL
  Filled 2018-12-30 (×5): qty 2

## 2018-12-30 MED ORDER — LACTATED RINGERS IV SOLN
INTRAVENOUS | Status: DC
  Administered 2018-12-30: 06:00:00 via INTRAVENOUS

## 2018-12-30 MED ORDER — KETOROLAC TROMETHAMINE 30 MG/ML IJ SOLN
INTRAMUSCULAR | Status: DC | PRN
  Administered 2018-12-30: 30 mg via INTRAVENOUS

## 2018-12-30 SURGICAL SUPPLY — 36 items
BLADE SURG 15 STRL LF DISP TIS (BLADE) ×1 IMPLANT
BLADE SURG 15 STRL SS (BLADE)
BLADE SURG SZ11 CARB STEEL (BLADE) ×3 IMPLANT
CHLORAPREP W/TINT 26 (MISCELLANEOUS) ×3 IMPLANT
COVER WAND RF STERILE (DRAPES) ×3 IMPLANT
DERMABOND ADVANCED (GAUZE/BANDAGES/DRESSINGS) ×2
DERMABOND ADVANCED .7 DNX12 (GAUZE/BANDAGES/DRESSINGS) ×1 IMPLANT
DRAPE LAPAROTOMY 100X77 ABD (DRAPES) ×3 IMPLANT
DRSG TEGADERM 2-3/8X2-3/4 SM (GAUZE/BANDAGES/DRESSINGS) ×3 IMPLANT
DRSG TELFA 4X3 1S NADH ST (GAUZE/BANDAGES/DRESSINGS) ×3 IMPLANT
ELECT CAUTERY BLADE 6.4 (BLADE) ×3 IMPLANT
ELECT REM PT RETURN 9FT ADLT (ELECTROSURGICAL) ×3
ELECTRODE REM PT RTRN 9FT ADLT (ELECTROSURGICAL) ×1 IMPLANT
GLOVE PI ORTHOPRO 6.5 (GLOVE) ×2
GLOVE PI ORTHOPRO STRL 6.5 (GLOVE) ×1 IMPLANT
GLOVE SURG SYN 6.5 ES PF (GLOVE) ×3 IMPLANT
GLOVE SURG SYN 6.5 PF PI (GLOVE) ×1 IMPLANT
GOWN STRL REUS W/ TWL LRG LVL3 (GOWN DISPOSABLE) ×2 IMPLANT
GOWN STRL REUS W/TWL LRG LVL3 (GOWN DISPOSABLE) ×4
KIT TURNOVER CYSTO (KITS) ×3 IMPLANT
LABEL OR SOLS (LABEL) ×3 IMPLANT
LIGASURE VESSEL 5MM BLUNT TIP (ELECTROSURGICAL) IMPLANT
NEEDLE HYPO 22GX1.5 SAFETY (NEEDLE) ×3 IMPLANT
NS IRRIG 500ML POUR BTL (IV SOLUTION) ×3 IMPLANT
PACK BASIN MINOR ARMC (MISCELLANEOUS) ×3 IMPLANT
RETRACTOR RING XSMALL (MISCELLANEOUS) IMPLANT
RETRACTOR WOUND ALXS 18CM SML (MISCELLANEOUS) IMPLANT
RTRCTR WOUND ALEXIS 13CM XS SH (MISCELLANEOUS) ×3
RTRCTR WOUND ALEXIS O 18CM SML (MISCELLANEOUS)
SPONGE LAP 18X18 RF (DISPOSABLE) ×3 IMPLANT
SUT MNCRL AB 4-0 PS2 18 (SUTURE) ×3 IMPLANT
SUT VIC AB 2-0 CT1 27 (SUTURE) ×4
SUT VIC AB 2-0 CT1 TAPERPNT 27 (SUTURE) ×2 IMPLANT
SUT VIC AB 2-0 UR6 27 (SUTURE) ×3 IMPLANT
SYR 10ML LL (SYRINGE) ×3 IMPLANT
TRAY FOLEY MTR SLVR 16FR STAT (SET/KITS/TRAYS/PACK) IMPLANT

## 2018-12-30 NOTE — Anesthesia Preprocedure Evaluation (Signed)
Anesthesia Evaluation  Patient identified by MRN, date of birth, ID band Patient awake    Reviewed: Allergy & Precautions, H&P , NPO status , Patient's Chart, lab work & pertinent test results, reviewed documented beta blocker date and time   Airway Mallampati: II  TM Distance: >3 FB Neck ROM: full    Dental no notable dental hx. (+) Teeth Intact   Pulmonary neg pulmonary ROS, Current Smoker, former smoker,    Pulmonary exam normal breath sounds clear to auscultation       Cardiovascular Exercise Tolerance: Good hypertension, negative cardio ROS   Rhythm:regular Rate:Normal     Neuro/Psych negative neurological ROS  negative psych ROS   GI/Hepatic negative GI ROS, Neg liver ROS,   Endo/Other  negative endocrine ROS  Renal/GU negative Renal ROS  negative genitourinary   Musculoskeletal   Abdominal   Peds  Hematology negative hematology ROS (+)   Anesthesia Other Findings   Reproductive/Obstetrics                             Anesthesia Physical  Anesthesia Plan  ASA: II  Anesthesia Plan: General   Post-op Pain Management:    Induction: Intravenous  PONV Risk Score and Plan:   Airway Management Planned: Oral ETT  Additional Equipment:   Intra-op Plan:   Post-operative Plan: Extubation in OR  Informed Consent: I have reviewed the patients History and Physical, chart, labs and discussed the procedure including the risks, benefits and alternatives for the proposed anesthesia with the patient or authorized representative who has indicated his/her understanding and acceptance.       Plan Discussed with: CRNA and Surgeon  Anesthesia Plan Comments:         Anesthesia Quick Evaluation

## 2018-12-30 NOTE — Progress Notes (Signed)
Post Partum Day 1  Subjective: Doing well, no concerns. Ambulating without difficulty, pain managed with PO meds, tolerating regular diet, and voiding without difficulty.   No fever/chills, chest pain, shortness of breath, nausea/vomiting, or leg pain. No nipple or breast pain.   Objective: BP 125/79 (BP Location: Right Arm)   Pulse 86   Temp 98.4 F (36.9 C) (Oral)   Resp 18   Ht 5\' 3"  (1.6 m)   Wt 73 kg   LMP 04/10/2018   SpO2 99%   Breastfeeding Unknown   BMI 28.52 kg/m    Physical Exam:  General: alert, cooperative, appears stated age and no distress Breasts: soft/nontender CV: RRR Pulm: nl effort, CTABL Abdomen: soft, non-tender, active bowel sounds Uterine Fundus: firm Lochia: appropriate DVT Evaluation: No evidence of DVT seen on physical exam. No cords or calf tenderness. No significant calf/ankle edema.  Recent Labs    12/29/18 1520 12/30/18 0523  HGB 11.6* 11.0*  HCT 34.2* 33.1*  WBC 11.2* 16.1*  PLT 287 253    Assessment/Plan: 38 y.o. [id number] postpartum day # 1  -Continue routine postpartum care -Lactation consult PRN for breastfeeding  -BTL scheduled for today -Immunization status:  all immunizations up to date  Disposition: Continue inpatient postpartum care    LOS: 1 day   Lisette Grinder, CNM 12/30/2018, 8:57 AM   ----- Lisette Grinder Certified Nurse Midwife [facility]

## 2018-12-30 NOTE — Transfer of Care (Signed)
Immediate Anesthesia Transfer of Care Note  Patient: Gloria [name]  Procedure(s) Performed: POST PARTUM TUBAL LIGATION (Bilateral Abdomen)  Patient Location: PACU  Anesthesia Type:General  Level of Consciousness: awake  Airway & Oxygen Therapy: Patient Spontanous Breathing and Patient connected to face mask oxygen  Post-op Assessment: Report given to RN and Post -op Vital signs reviewed and stable  Post vital signs: Reviewed and stable  Last Vitals:  Vitals Value Taken Time  BP 128/75 12/30/18 1212  Temp    Pulse 90 12/30/18 1213  Resp 17 12/30/18 1213  SpO2 100 % 12/30/18 1213  Vitals shown include unvalidated device data.  Last Pain:  Vitals:   12/30/18 1022  TempSrc:   PainSc: 0-No pain         Complications: No apparent anesthesia complications

## 2018-12-30 NOTE — Progress Notes (Signed)
Post Partum Day 1 Subjective: Doing well, no complaints.  Tolerating regular diet, pain with PO meds, voiding and ambulating without difficulty.  No CP SOB F/C N/V or leg pain no HA change of vision, RUQ/epigastric pain  NPO after midnight for tubal liagation, confirms desired permanent sterilization  Objective: BP (!) 139/94   Pulse 85   Temp 98 F (36.7 C)   Resp 18   Ht 5\' 3"  (1.6 m)   Wt 73 kg   LMP 04/10/2018   SpO2 98%   Breastfeeding Unknown   BMI 28.51 kg/m    Physical Exam:  General: NAD CV: RRR Pulm: nl effort, CTABL Lochia: moderate Uterine Fundus: fundus firm and below umbilicus DVT Evaluation: no cords, ttp LEs   Recent Labs    12/29/18 1520 12/30/18 0523  HGB 11.6* 11.0*  HCT 34.2* 33.1*  WBC 11.2* 16.1*  PLT 287 253    Assessment/Plan: 38 y.o. [id number] postpartum day # 1  1.  Doing very well, continue routine postpartum care 2 Lactation support 3. To OR when ready.  Consents signed.     ----- Larey Days, MD Attending Obstetrician and Gynecologist [facility]

## 2018-12-30 NOTE — Op Note (Signed)
  Post Partum Tubal Ligation  Indication for procedure: desired permanent sterilization  Pre-op diagnosis: s/p term vaginal delivery, desired permanent sterilization Post-op diagnosis: same Procedure: post partum bilateral tubal ligation Surgeon: Chelsea Ward Assist: none Anesthesia: general  IVF: 1000cc crystalloid EBL: minimal - 2cc UOP: none Findings: patent bilateral tubes, normal post-gravid uterine fundus Specimens: portion of left tube portion of right tube Complications: none apparent Disposition: stable to PACU  Procedure in detail: The patient was seen and confirmed desire for permanent sterilization.  She was identified as Gloria [name] and was brought to the OR.  General anesthesia was administered, and the patient was prepped and draped in the usual sterile fashion.  A surgical time-out was called.  An 11-blade was used to incise the skin in the supraumbilical area.  The subcutaneous tissues were dissected to and then through the fascia, and the peritoneum was grasped and divided.  An alexis retractor was placed in the abdominal cavity and positioned.  The uterus was identified, and the right tube was grasped with a babcock and traced to the fimbriated end.  A kelly clamp was placed in a step-wise fashion along the mesosalpinx and metzenbaums were used to transect the tube from the underlying tissue.  2-0 Vicryl was used to tie the remaining tissue.  The same steps were used on the left side.  All dissected ends were found to be hemostatic.  The alexis retractor was removed, and the fascia was reapproximated with 1 vicryl.  The subcutaneous tissue was irrigated and then the skin was closed with 4-0 monocryl.  The skin was covered in surgical glue.  The sponge, needle, and instrument counts were correct x2.  The patient tolerated the procedure and was brought to PACU in a stable condition.  Larey Days, MD Attending Obstetrician and Gynecologist [facility]

## 2018-12-30 NOTE — Lactation Note (Signed)
This note was copied from a baby's chart. Lactation Consultation Note  Patient Name: Gloria [name] [id number] Date: 12/30/2018   Observed mom hand expressing and latching Gloria [name] without assistance.  Father of baby seems very supportive of breast feeding.   Mom declines any help and denies any breast or nipple pain.  This is mom's 8th baby.  She reports some she breastfed, some would not latch and some were born early.  Reviewed newborn feeding cues, normal size of newborn stomach, supply and demand, normal course of lactation and routine newborn feeding patterns.  Lactation name and number written on white board and mom says she will call if she needs lactation's help.  Maternal Data    Feeding Feeding Type: Breast Fed  LATCH Score                   Interventions    Lactation Tools Discussed/Used     Consult Status      Gloria [name] 12/30/2018, 8:51 PM

## 2018-12-30 NOTE — Anesthesia Postprocedure Evaluation (Signed)
Anesthesia Post Note  Patient: Gloria [name]  Procedure(s) Performed: POST PARTUM TUBAL LIGATION (Bilateral Abdomen)  Patient location during evaluation: PACU Anesthesia Type: General Level of consciousness: awake and alert and oriented Pain management: pain level controlled Vital Signs Assessment: post-procedure vital signs reviewed and stable Respiratory status: spontaneous breathing Cardiovascular status: blood pressure returned to baseline Anesthetic complications: no     Last Vitals:  Vitals:   12/30/18 1316 12/30/18 1353  BP: 129/78 114/72  Pulse: 71 79  Resp: 18 20  Temp: 36.8 C 36.7 C  SpO2: 98% 97%    Last Pain:  Vitals:   12/30/18 1353  TempSrc: Oral  PainSc:                  Hunter Pinkard

## 2018-12-30 NOTE — Anesthesia Post-op Follow-up Note (Signed)
Anesthesia QCDR form completed.        

## 2018-12-30 NOTE — Anesthesia Procedure Notes (Signed)
Procedure Name: Intubation Date/Time: 12/30/2018 11:07 AM Performed by: Cory Munch, RN Pre-anesthesia Checklist: Patient identified, Emergency Drugs available, Suction available and Patient being monitored Patient Re-evaluated:Patient Re-evaluated prior to induction Oxygen Delivery Method: Circle system utilized Preoxygenation: Pre-oxygenation with 100% oxygen Induction Type: IV induction and Rapid sequence Ventilation: Mask ventilation without difficulty Laryngoscope Size: McGraph and 3 Grade View: Grade I Tube type: Oral Tube size: 7.0 mm Number of attempts: 1 Airway Equipment and Method: Stylet and Oral airway Placement Confirmation: ETT inserted through vocal cords under direct vision,  positive ETCO2 and breath sounds checked- equal and bilateral Secured at: 21 cm Tube secured with: Tape Dental Injury: Teeth and Oropharynx as per pre-operative assessment

## 2018-12-31 ENCOUNTER — Encounter: Payer: Self-pay | Admitting: Obstetrics & Gynecology

## 2018-12-31 LAB — SURGICAL PATHOLOGY

## 2018-12-31 MED ORDER — OXYCODONE-ACETAMINOPHEN 5-325 MG PO TABS: 1.0000 | ORAL_TABLET | Freq: Four times a day (QID) | ORAL | 0 refills | Status: DC | PRN

## 2018-12-31 MED ORDER — IBUPROFEN 800 MG PO TABS: 800.0000 mg | ORAL_TABLET | Freq: Three times a day (TID) | ORAL | 1 refills | Status: DC | PRN

## 2018-12-31 MED ORDER — FERROUS SULFATE 325 (65 FE) MG PO TABS: 325.0000 mg | ORAL_TABLET | Freq: Every day | ORAL | 1 refills | Status: DC

## 2018-12-31 NOTE — Progress Notes (Signed)
Discharge instructions, prescriptions, education, and appointments given and explained. Pt verbalized understanding with no further questions. Pt wheeled to personal vehicle via wheelchair per staff accompanied by husband and holding infant, along with all belongings.

## 2020-03-19 ENCOUNTER — Emergency Department
Admission: EM | Admit: 2020-03-19 | Discharge: 2020-03-19 | Discharge status: Against Medical Advice | Payer: Medicaid Other

## 2021-02-18 NOTE — H&P (Signed)
Chief Complaint:  Gloria [name] is a 40 y.o. female presenting with Follow-up and Pre Op Consulting   History of Present Illness: Pt returns today for preop exam. She has hx of excessive menstrual bleeding and has requested hyst for definitive treatment. She is a smoker and has a hx of migraines and has had intermittent high blood pressure. She declines implant and she has failed IUD. She is s/p OCPs and Depo shot without improvement. She has a tubal.  Today: very anxious for surgery   Workup: Pap: 11/2020 neg/neg EMBx: collected today  TVUS Today Ut wnl  9.09 x 5.78 x 7.49 cm Endometrium=9.73 mm bil ov simple cysts seen 1 rt ov=1.14 cm 2 rt=1.09  Cm 1 lt ov= 1.53 cm   Pertinent Hx: - s/p 8 SVD's: 6 boys, 2 girls  - Postpartum BTL with [facility] 12/2018   - hx of anemia   Past Medical History:  has a past medical history of Anemia, Chickenpox, Gallstones, Menorrhagia, Migraines, and Supervision of high risk pregnancy in second trimester (03/02/2014).  Past Surgical History:  has a past surgical history that includes Tubal ligation (2020). Family History: family history includes Colon cancer in an other family member; Diabetes in some other family members; Heart disease in an other family member; High blood pressure (Hypertension) in some other family members; Kidney cancer in her paternal grandmother; Lung cancer in her paternal grandmother; No Known Problems in her father and mother. Social History:  reports that she has been smoking cigarettes. She has been smoking an average of .25 packs per day. She has never used smokeless tobacco. She reports that she does not drink alcohol and does not use drugs. OB/GYN History:  OB History    Gravida 8   Para 8   Term 6   Preterm 2   AB     Living 8    SAB     IAB     Ectopic     Molar     Multiple     Live Births 8      Allergies: has No Known Allergies. Medications: Current Outpatient Medications:     clobetasoL (TEMOVATE) 0.05 % ointment, Apply topically 2 (two) times daily, Disp: 30 g, Rfl: 1  Review of Systems: No SOB, no palpitations or chest pain, no new lower extremity edema, no nausea or vomiting or bowel or bladder complaints. See HPI for gyn specific ROS.   Exam:  BP 120/76   Pulse (!) 118   Ht 160 cm (5\' 3" )   Wt 63.1 kg (139 lb 3.2 oz)   LMP 01/29/2021 (Approximate)   BMI 24.66 kg/m   General: Patient is well-groomed, well-nourished, appears stated age in no acute distress   HEENT: head is atraumatic and normocephalic, trachea is midline, neck is supple with no palpable nodules   CV: Regular rhythm and normal heart rate, no murmur   Pulm: Clear to auscultation throughout lung fields with no wheezing, crackles, or rhonchi. No increased work of breathing  Abdomen: soft , no mass, non-tender, no rebound tenderness, no hepatomegaly  Pelvic: tanner stage 5 ,   External genitalia: vulva /labia no lesions  Urethra: no prolapse  Vagina: normal physiologic d/c, laxity in vaginal walls  Cervix: no lesions, no cervical motion tenderness, good descent  Uterus: normal size shape and contour, non-tender  Adnexa: no mass,  non-tender    Rectovaginal: External wnl  Endometrial biopsy: The cervix was cleaned with betadine, topical Hurriciane spray applied,  and a single tooth tenaculum is applied to the anterior cervix. The Pipelle catheter was placed into the endometrial cavity. It sounds to 7 cm and scant tissue was removed.    Impression:  The primary encounter diagnosis was Preop examination. Diagnoses of Excessive or frequent menstruation and Menorrhagia with regular cycle were also pertinent to this visit.  Plan:  1. Preop Visit, Menorrhagia; hx of abnormal cervical cancer screening -Patient returns for a preoperative discussion regarding her plans to proceed with surgical treatment of her Excessive menstrual bleeding by total laparoscopic hysterectomy with bilateral  salpingectomy procedure.  We may perform a cystoscopy to evaluate the urinary tract after the procedure, if surgically indicated for uro tract integrity.   EMBx collected today. Pt tolerated the procedure well.   The patient and I discussed the technical aspects of the procedure including the potential for risks and complications.  These include but are not limited to the risk of infection requiring post-operative antibiotics or further procedures.  We talked about the risk of injury to adjacent organs including bladder, bowel, ureter, blood vessels or nerves.  We talked about the need to convert to an open incision.  We talked about the possible need for blood transfusion.  We talked about postop complications such as thromboembolic or cardiopulmonary complications.  All of her questions were answered.  Her preoperative exam was completed and the appropriate consents were signed. She is scheduled to undergo this procedure in the near future.  Specific Peri-operative Considerations:  - Consent: obtained today - Health Maintenance: up to date  - Labs: CBC, CMP preoperatively - Studies: EKG, CXR preoperatively - Bowel Preparation: None required - Abx:  Ancef 2g - VTE ppx: SCDs perioperatively - Glucose Protocol: none - Beta-blockade: none  Diagnoses and all orders for this visit:  Preop examination  Excessive or frequent menstruation -     Pathology Report - [facility]  Menorrhagia with regular cycle -     Pathology Report - [facility]   Return for Postop check.   ~~~~~~~~~~~~~~~~~~~~~~~~~~~~~~~~~~~~~~~~~~~~~~~~~~~~~~~~~~~~ This note is partially written by Jerene Canny, in the presence of and acting as the scribe of Dr. [name], who has reviewed, edited and added to the note to reflect her best personal medical judgment.  This note was generated in part with voice recognition software and I apologize for any typographical errors that were not detected and corrected.    I personally  performed the service. (TP)  Jairo Bellew Babette Relic, MD

## 2021-02-20 ENCOUNTER — Other Ambulatory Visit
Admission: RE | Admit: 2021-02-20 | Discharge: 2021-02-20 | Disposition: A | Discharge status: Home / Self Care | Payer: Medicaid Other | Source: Ambulatory Visit | Attending: Obstetrics and Gynecology | Admitting: Obstetrics and Gynecology

## 2021-02-20 ENCOUNTER — Other Ambulatory Visit: Payer: Self-pay

## 2021-02-20 NOTE — Patient Instructions (Signed)
Your procedure is scheduled on: Monday March 04, 2021. Report to Day Surgery inside [facility] 2nd floor. To find out your arrival time please call [phone] between 1PM - 3PM on Friday March 01, 2021.  Remember: Instructions that are not followed completely may result in serious medical risk,  up to and including death, or upon the discretion of your surgeon and anesthesiologist your  surgery may need to be rescheduled.     _X__ 1. Do not eat food after midnight the night before your procedure.                 No chewing gum or hard candies. You may drink clear liquids up to 2 hours                 before you are scheduled to arrive for your surgery- DO not drink clear                 liquids within 2 hours of the start of your surgery.                 Clear Liquids include:  water, apple juice without pulp, clear Gatorade, G2 or                  Gatorade Zero (avoid Red/Purple/Blue), Black Coffee or Tea (Do not add                 anything to coffee or tea).  __X__2.  On the morning of surgery brush your teeth with toothpaste and water, you                may rinse your mouth with mouthwash if you wish.  Do not swallow any toothpaste or mouthwash.     _X__ 3.  No Alcohol for 24 hours before or after surgery.   _X__ 4.  Do Not Smoke or use e-cigarettes For 24 Hours Prior to Your Surgery.                 Do not use any chewable tobacco products for at least 6 hours prior to                 Surgery.  _X__  5.  Do not use any recreational drugs (marijuana, cocaine, heroin, ecstasy, MDMA or other)                For at least one week prior to your surgery.  Combination of these drugs with anesthesia                May have life threatening results.  __X__6.  Notify your doctor if there is any change in your medical condition      (cold, fever, infections).     Do not wear jewelry, make-up, hairpins, clips or nail polish. Do not wear lotions, powders,  or perfumes. You may wear deodorant. Do not shave 48 hours prior to surgery.  Do not bring valuables to the hospital.    [facility] is not responsible for any belongings or valuables.  Contacts, dentures or bridgework may not be worn into surgery. Leave your suitcase in the car. After surgery it may be brought to your room. For patients admitted to the hospital, discharge time is determined by your treatment team.   Patients discharged the day of surgery will not be allowed to drive home.   Make arrangements for someone to be with you for the first 24  hours of your Same Day Discharge.   __X__ Take these medicines the morning of surgery with A SIP OF WATER:    1. None   2.   3.   4.  5.  6.  ____ Fleet Enema (as directed)   __X__ Use CHG Soap (or wipes) as directed  ____ Use Benzoyl Peroxide Gel as instructed  ____ Use inhalers on the day of surgery  ____ Stop metformin 2 days prior to surgery    ____ Take 1/2 of usual insulin dose the night before surgery. No insulin the morning          of surgery.   ____ Call your PCP, cardiologist, or Pulmonologist if taking Coumadin/Plavix/aspirin and ask when to stop before your surgery.   __X__ One Week prior to surgery- Stop Anti-inflammatories such as Ibuprofen, Aleve, Advil, Motrin, meloxicam (MOBIC), diclofenac, etodolac, ketorolac, Toradol, Daypro, piroxicam, Goody's or BC powders. OK TO USE TYLENOL IF NEEDED   __X__ Stop supplements until after surgery.    ____ Bring C-Pap to the hospital.    If you have any questions regarding your pre-procedure instructions,  Please call Pre-admit Testing at [phone].

## 2021-02-22 ENCOUNTER — Other Ambulatory Visit: Payer: Self-pay

## 2021-02-22 ENCOUNTER — Other Ambulatory Visit
Admission: RE | Admit: 2021-02-22 | Discharge: 2021-02-22 | Disposition: A | Discharge status: Home / Self Care | Payer: Medicaid Other | Source: Ambulatory Visit | Attending: Obstetrics and Gynecology | Admitting: Obstetrics and Gynecology

## 2021-02-22 DIAGNOSIS — N920 Excessive and frequent menstruation with regular cycle: Secondary | ICD-10-CM | POA: Diagnosis not present

## 2021-02-22 LAB — CBC
HCT: 44.2 % (ref 36.0–46.0)
Hemoglobin: 14.5 g/dL (ref 12.0–15.0)
MCH: 30.8 pg (ref 26.0–34.0)
MCHC: 32.8 g/dL (ref 30.0–36.0)
MCV: 93.8 fL (ref 80.0–100.0)
Platelets: 269 10*3/uL (ref 150–400)
RBC: 4.71 MIL/uL (ref 3.87–5.11)
RDW: 13.4 % (ref 11.5–15.5)
WBC: 10 10*3/uL (ref 4.0–10.5)
nRBC: 0 % (ref 0.0–0.2)

## 2021-02-22 LAB — BASIC METABOLIC PANEL
Anion gap: 5 (ref 5–15)
BUN: 14 mg/dL (ref 6–20)
CO2: 25 mmol/L (ref 22–32)
Calcium: 8.9 mg/dL (ref 8.9–10.3)
Chloride: 107 mmol/L (ref 98–111)
Creatinine, Ser: 0.72 mg/dL (ref 0.44–1.00)
GFR, Estimated: >60 mL/min (ref >60)
Glucose, Bld: 89 mg/dL (ref 70–99)
Potassium: 4.4 mmol/L (ref 3.5–5.1)
Sodium: 137 mmol/L (ref 135–145)

## 2021-02-22 LAB — TYPE AND SCREEN
ABO/RH(D): A POS
Antibody Screen: NEGATIVE

## 2021-03-03 MED ORDER — ACETAMINOPHEN 500 MG PO TABS: 1000.0000 mg | ORAL_TABLET | ORAL | Status: AC

## 2021-03-03 MED ORDER — FAMOTIDINE 20 MG PO TABS: 20.0000 mg | ORAL_TABLET | Freq: Once | ORAL | Status: AC

## 2021-03-03 MED ORDER — CHLORHEXIDINE GLUCONATE 0.12 % MT SOLN: 15.0000 mL | Freq: Once | OROMUCOSAL | Status: AC

## 2021-03-03 MED ORDER — LACTATED RINGERS IV SOLN: INTRAVENOUS | Status: DC

## 2021-03-03 MED ORDER — GABAPENTIN 300 MG PO CAPS: 300.0000 mg | ORAL_CAPSULE | ORAL | Status: AC

## 2021-03-03 MED ORDER — CEFAZOLIN SODIUM-DEXTROSE 2-4 GM/100ML-% IV SOLN
2.0000 g | INTRAVENOUS | Status: AC
  Administered 2021-03-04: 2 g via INTRAVENOUS

## 2021-03-03 MED ORDER — ORAL CARE MOUTH RINSE: 15.0000 mL | Freq: Once | OROMUCOSAL | Status: AC

## 2021-03-03 MED ORDER — POVIDONE-IODINE 10 % EX SWAB: 2.0000 "application " | Freq: Once | CUTANEOUS | Status: DC

## 2021-03-04 ENCOUNTER — Ambulatory Visit: Payer: Medicaid Other | Admitting: Registered Nurse

## 2021-03-04 ENCOUNTER — Encounter: Payer: Self-pay | Admitting: Obstetrics and Gynecology

## 2021-03-04 ENCOUNTER — Ambulatory Visit: Payer: Medicaid Other | Admitting: Certified Registered Nurse Anesthetist

## 2021-03-04 ENCOUNTER — Encounter
Admission: EM | Disposition: A | Discharge status: Home / Self Care | Payer: Self-pay | Source: Ambulatory Visit | Attending: Obstetrics and Gynecology

## 2021-03-04 ENCOUNTER — Other Ambulatory Visit: Payer: Self-pay

## 2021-03-04 ENCOUNTER — Ambulatory Visit
Admission: RE | Admit: 2021-03-04 | Discharge: 2021-03-04 | Disposition: A | Discharge status: Home / Self Care | Payer: Medicaid Other | Source: Ambulatory Visit | Attending: Obstetrics and Gynecology | Admitting: Obstetrics and Gynecology

## 2021-03-04 ENCOUNTER — Encounter
Admission: RE | Disposition: A | Discharge status: Home / Self Care | Payer: Self-pay | Source: Ambulatory Visit | Attending: Obstetrics and Gynecology

## 2021-03-04 ENCOUNTER — Ambulatory Visit
Admission: EM | Admit: 2021-03-04 | Discharge: 2021-03-04 | Disposition: A | Discharge status: Home / Self Care | Payer: Medicaid Other | Source: Ambulatory Visit | Attending: Obstetrics and Gynecology | Admitting: Obstetrics and Gynecology

## 2021-03-04 ENCOUNTER — Ambulatory Visit: Payer: Medicaid Other | Admitting: Urgent Care

## 2021-03-04 DIAGNOSIS — N921 Excessive and frequent menstruation with irregular cycle: Secondary | ICD-10-CM | POA: Insufficient documentation

## 2021-03-04 DIAGNOSIS — F17210 Nicotine dependence, cigarettes, uncomplicated: Secondary | ICD-10-CM | POA: Insufficient documentation

## 2021-03-04 DIAGNOSIS — G43909 Migraine, unspecified, not intractable, without status migrainosus: Secondary | ICD-10-CM | POA: Insufficient documentation

## 2021-03-04 DIAGNOSIS — I10 Essential (primary) hypertension: Secondary | ICD-10-CM | POA: Diagnosis not present

## 2021-03-04 DIAGNOSIS — N99820 Postprocedural hemorrhage and hematoma of a genitourinary system organ or structure following a genitourinary system procedure: Secondary | ICD-10-CM | POA: Insufficient documentation

## 2021-03-04 DIAGNOSIS — R030 Elevated blood-pressure reading, without diagnosis of hypertension: Secondary | ICD-10-CM | POA: Insufficient documentation

## 2021-03-04 DIAGNOSIS — N920 Excessive and frequent menstruation with regular cycle: Secondary | ICD-10-CM

## 2021-03-04 DIAGNOSIS — F17200 Nicotine dependence, unspecified, uncomplicated: Secondary | ICD-10-CM | POA: Diagnosis not present

## 2021-03-04 HISTORY — PX: VAGINAL HYSTERECTOMY: SHX2639

## 2021-03-04 LAB — BASIC METABOLIC PANEL
Anion gap: 9 (ref 5–15)
BUN: 14 mg/dL (ref 6–20)
CO2: 22 mmol/L (ref 22–32)
Calcium: 8.9 mg/dL (ref 8.9–10.3)
Chloride: 104 mmol/L (ref 98–111)
Creatinine, Ser: 0.68 mg/dL (ref 0.44–1.00)
GFR, Estimated: >60 mL/min (ref >60)
Glucose, Bld: 134 mg/dL — ABNORMAL HIGH (ref 70–99)
Potassium: 4 mmol/L (ref 3.5–5.1)
Sodium: 135 mmol/L (ref 135–145)

## 2021-03-04 LAB — FIBRINOGEN: Fibrinogen: 207 mg/dL — ABNORMAL LOW (ref 210–475)

## 2021-03-04 LAB — CBC
HCT: 38.8 % (ref 36.0–46.0)
Hemoglobin: 13.5 g/dL (ref 12.0–15.0)
MCH: 31.5 pg (ref 26.0–34.0)
MCHC: 34.8 g/dL (ref 30.0–36.0)
MCV: 90.4 fL (ref 80.0–100.0)
Platelets: 285 10*3/uL (ref 150–400)
RBC: 4.29 MIL/uL (ref 3.87–5.11)
RDW: 12.9 % (ref 11.5–15.5)
WBC: 18.9 10*3/uL — ABNORMAL HIGH (ref 4.0–10.5)
nRBC: 0 % (ref 0.0–0.2)

## 2021-03-04 LAB — PROTIME-INR
INR: 1.1 (ref 0.8–1.2)
Prothrombin Time: 13.9 seconds (ref 11.4–15.2)

## 2021-03-04 LAB — POCT PREGNANCY, URINE: Preg Test, Ur: NEGATIVE

## 2021-03-04 SURGERY — EXAM UNDER ANESTHESIA
Anesthesia: General | Site: Vagina

## 2021-03-04 SURGERY — HYSTERECTOMY, VAGINAL
Anesthesia: General

## 2021-03-04 MED ORDER — KETOROLAC TROMETHAMINE 30 MG/ML IJ SOLN
INTRAMUSCULAR | Status: AC
  Filled 2021-03-04: qty 1

## 2021-03-04 MED ORDER — CHLORHEXIDINE GLUCONATE 0.12 % MT SOLN
OROMUCOSAL | Status: AC
  Administered 2021-03-04: 15 mL via OROMUCOSAL
  Filled 2021-03-04: qty 15

## 2021-03-04 MED ORDER — FENTANYL CITRATE (PF) 100 MCG/2ML IJ SOLN
INTRAMUSCULAR | Status: DC | PRN
  Administered 2021-03-04 (×2): 50 ug via INTRAVENOUS

## 2021-03-04 MED ORDER — ONDANSETRON HCL 4 MG/2ML IJ SOLN: 4.0000 mg | Freq: Once | INTRAMUSCULAR | Status: DC | PRN

## 2021-03-04 MED ORDER — ROCURONIUM BROMIDE 100 MG/10ML IV SOLN
INTRAVENOUS | Status: DC | PRN
  Administered 2021-03-04 (×2): 20 mg via INTRAVENOUS
  Administered 2021-03-04: 50 mg via INTRAVENOUS

## 2021-03-04 MED ORDER — IBUPROFEN 800 MG PO TABS: 800.0000 mg | ORAL_TABLET | Freq: Three times a day (TID) | ORAL | 1 refills | Status: AC

## 2021-03-04 MED ORDER — SUCCINYLCHOLINE CHLORIDE 200 MG/10ML IV SOSY
PREFILLED_SYRINGE | INTRAVENOUS | Status: DC | PRN
  Administered 2021-03-04: 80 mg via INTRAVENOUS

## 2021-03-04 MED ORDER — CEFAZOLIN SODIUM-DEXTROSE 2-4 GM/100ML-% IV SOLN
2.0000 g | Freq: Once | INTRAVENOUS | Status: AC
  Administered 2021-03-04: 2 g via INTRAVENOUS

## 2021-03-04 MED ORDER — KETOROLAC TROMETHAMINE 30 MG/ML IJ SOLN
INTRAMUSCULAR | Status: DC | PRN
  Administered 2021-03-04: 30 mg via INTRAVENOUS

## 2021-03-04 MED ORDER — ACETAMINOPHEN 500 MG PO TABS
ORAL_TABLET | ORAL | Status: AC
  Administered 2021-03-04: 1000 mg via ORAL
  Filled 2021-03-04: qty 2

## 2021-03-04 MED ORDER — DOCUSATE SODIUM 100 MG PO CAPS: 100.0000 mg | ORAL_CAPSULE | Freq: Two times a day (BID) | ORAL | 0 refills | Status: DC

## 2021-03-04 MED ORDER — ONDANSETRON 4 MG PO TBDP: 4.0000 mg | ORAL_TABLET | Freq: Three times a day (TID) | ORAL | 0 refills | Status: DC | PRN

## 2021-03-04 MED ORDER — VASOPRESSIN 20 UNIT/ML IV SOLN
INTRAVENOUS | Status: AC
  Filled 2021-03-04: qty 1

## 2021-03-04 MED ORDER — PROPOFOL 10 MG/ML IV BOLUS
INTRAVENOUS | Status: AC
  Filled 2021-03-04: qty 20

## 2021-03-04 MED ORDER — OXYCODONE HCL 5 MG PO TABS: 5.0000 mg | ORAL_TABLET | ORAL | 0 refills | Status: DC | PRN

## 2021-03-04 MED ORDER — LIDOCAINE HCL (CARDIAC) PF 100 MG/5ML IV SOSY
PREFILLED_SYRINGE | INTRAVENOUS | Status: DC | PRN
  Administered 2021-03-04: 50 mg via INTRAVENOUS

## 2021-03-04 MED ORDER — ACETAMINOPHEN 500 MG PO TABS: 1000.0000 mg | ORAL_TABLET | Freq: Four times a day (QID) | ORAL | 0 refills | Status: AC

## 2021-03-04 MED ORDER — MIDAZOLAM HCL 2 MG/2ML IJ SOLN
INTRAMUSCULAR | Status: AC
  Filled 2021-03-04: qty 2

## 2021-03-04 MED ORDER — LIDOCAINE-EPINEPHRINE 1 %-1:100000 IJ SOLN
INTRAMUSCULAR | Status: AC
  Filled 2021-03-04: qty 1

## 2021-03-04 MED ORDER — GABAPENTIN 800 MG PO TABS: 800.0000 mg | ORAL_TABLET | Freq: Every day | ORAL | 0 refills | Status: DC

## 2021-03-04 MED ORDER — DEXAMETHASONE SODIUM PHOSPHATE 10 MG/ML IJ SOLN
INTRAMUSCULAR | Status: AC
  Filled 2021-03-04: qty 2

## 2021-03-04 MED ORDER — OXYCODONE HCL 5 MG PO TABS
ORAL_TABLET | ORAL | Status: AC
  Filled 2021-03-04: qty 1

## 2021-03-04 MED ORDER — MIDAZOLAM HCL 2 MG/2ML IJ SOLN
INTRAMUSCULAR | Status: DC | PRN
  Administered 2021-03-04: 2 mg via INTRAVENOUS

## 2021-03-04 MED ORDER — LIDOCAINE-EPINEPHRINE 1 %-1:100000 IJ SOLN
INTRAMUSCULAR | Status: DC | PRN
  Administered 2021-03-04: 16 mL

## 2021-03-04 MED ORDER — DEXMEDETOMIDINE (PRECEDEX) IN NS 20 MCG/5ML (4 MCG/ML) IV SYRINGE
PREFILLED_SYRINGE | INTRAVENOUS | Status: DC | PRN
  Administered 2021-03-04 (×4): 4 ug via INTRAVENOUS

## 2021-03-04 MED ORDER — ONDANSETRON HCL 4 MG/2ML IJ SOLN
INTRAMUSCULAR | Status: DC | PRN
  Administered 2021-03-04: 4 mg via INTRAVENOUS

## 2021-03-04 MED ORDER — FENTANYL CITRATE (PF) 100 MCG/2ML IJ SOLN
INTRAMUSCULAR | Status: AC
  Filled 2021-03-04: qty 2

## 2021-03-04 MED ORDER — ACETAMINOPHEN 10 MG/ML IV SOLN
INTRAVENOUS | Status: AC
  Filled 2021-03-04: qty 100

## 2021-03-04 MED ORDER — GLYCOPYRROLATE 0.2 MG/ML IJ SOLN
INTRAMUSCULAR | Status: AC
  Filled 2021-03-04: qty 1

## 2021-03-04 MED ORDER — FENTANYL CITRATE (PF) 100 MCG/2ML IJ SOLN
25.0000 ug | INTRAMUSCULAR | Status: DC | PRN
Start: 2021-03-04 — End: 2021-03-05
  Administered 2021-03-04 (×3): 50 ug via INTRAVENOUS

## 2021-03-04 MED ORDER — SILVER NITRATE-POT NITRATE 75-25 % EX MISC
CUTANEOUS | Status: AC
  Filled 2021-03-04: qty 10

## 2021-03-04 MED ORDER — PROPOFOL 10 MG/ML IV BOLUS
INTRAVENOUS | Status: DC | PRN
  Administered 2021-03-04: 120 mg via INTRAVENOUS

## 2021-03-04 MED ORDER — ONDANSETRON HCL 4 MG/2ML IJ SOLN
INTRAMUSCULAR | Status: AC
  Filled 2021-03-04: qty 4

## 2021-03-04 MED ORDER — CEFAZOLIN SODIUM-DEXTROSE 2-4 GM/100ML-% IV SOLN
INTRAVENOUS | Status: AC
  Filled 2021-03-04: qty 100

## 2021-03-04 MED ORDER — OXYCODONE HCL 5 MG/5ML PO SOLN: 5.0000 mg | Freq: Once | ORAL | Status: AC | PRN

## 2021-03-04 MED ORDER — SUGAMMADEX SODIUM 200 MG/2ML IV SOLN
INTRAVENOUS | Status: DC | PRN
  Administered 2021-03-04: 200 mg via INTRAVENOUS

## 2021-03-04 MED ORDER — SODIUM CHLORIDE 0.9 % IV SOLN: INTRAVENOUS | Status: DC

## 2021-03-04 MED ORDER — LACTATED RINGERS IV SOLN: INTRAVENOUS | Status: DC

## 2021-03-04 MED ORDER — LIDOCAINE HCL (CARDIAC) PF 100 MG/5ML IV SOSY
PREFILLED_SYRINGE | INTRAVENOUS | Status: DC | PRN
  Administered 2021-03-04: 60 mg via INTRAVENOUS

## 2021-03-04 MED ORDER — FAMOTIDINE 20 MG PO TABS
ORAL_TABLET | ORAL | Status: AC
  Administered 2021-03-04: 20 mg via ORAL
  Filled 2021-03-04: qty 1

## 2021-03-04 MED ORDER — OXYCODONE HCL 5 MG PO TABS
5.0000 mg | ORAL_TABLET | Freq: Once | ORAL | Status: AC | PRN
  Administered 2021-03-04: 5 mg via ORAL

## 2021-03-04 MED ORDER — SODIUM CHLORIDE (PF) 0.9 % IJ SOLN
INTRAMUSCULAR | Status: AC
  Filled 2021-03-04: qty 50

## 2021-03-04 MED ORDER — FENTANYL CITRATE (PF) 100 MCG/2ML IJ SOLN
INTRAMUSCULAR | Status: DC | PRN
  Administered 2021-03-04 (×4): 50 ug via INTRAVENOUS

## 2021-03-04 MED ORDER — GABAPENTIN 300 MG PO CAPS
ORAL_CAPSULE | ORAL | Status: AC
  Administered 2021-03-04: 300 mg via ORAL
  Filled 2021-03-04: qty 1

## 2021-03-04 MED ORDER — FENTANYL CITRATE (PF) 100 MCG/2ML IJ SOLN
25.0000 ug | INTRAMUSCULAR | Status: DC | PRN
  Administered 2021-03-04 (×4): 50 ug via INTRAVENOUS

## 2021-03-04 MED ORDER — PHENYLEPHRINE HCL (PRESSORS) 10 MG/ML IV SOLN
INTRAVENOUS | Status: AC
  Filled 2021-03-04: qty 1

## 2021-03-04 MED ORDER — DEXAMETHASONE SODIUM PHOSPHATE 10 MG/ML IJ SOLN
INTRAMUSCULAR | Status: DC | PRN
  Administered 2021-03-04: 5 mg via INTRAVENOUS

## 2021-03-04 MED ORDER — DEXAMETHASONE SODIUM PHOSPHATE 10 MG/ML IJ SOLN
INTRAMUSCULAR | Status: DC | PRN
  Administered 2021-03-04: 10 mg via INTRAVENOUS

## 2021-03-04 MED ORDER — LIDOCAINE HCL (PF) 2 % IJ SOLN
INTRAMUSCULAR | Status: AC
  Filled 2021-03-04: qty 10

## 2021-03-04 MED ORDER — ACETAMINOPHEN 10 MG/ML IV SOLN
INTRAVENOUS | Status: DC | PRN
  Administered 2021-03-04: 1000 mg via INTRAVENOUS

## 2021-03-04 SURGICAL SUPPLY — 37 items
BAG DRN RND TRDRP ANRFLXCHMBR (UROLOGICAL SUPPLIES) ×2
BAG URINE DRAIN 2000ML AR STRL (UROLOGICAL SUPPLIES) ×3 IMPLANT
CATH FOLEY 2WAY  5CC 16FR (CATHETERS) ×1
CATH FOLEY 2WAY 5CC 16FR (CATHETERS) ×2
CATH URTH 16FR FL 2W BLN LF (CATHETERS) ×2 IMPLANT
DRAPE PERI LITHO V/GYN (MISCELLANEOUS) ×3 IMPLANT
DRAPE SURG 17X11 SM STRL (DRAPES) ×3 IMPLANT
DRAPE UNDER BUTTOCK W/FLU (DRAPES) ×3 IMPLANT
ELECT CAUTERY BLADE 6.4 (BLADE) ×2 IMPLANT
ELECT REM PT RETURN 9FT ADLT (ELECTROSURGICAL) ×3
ELECTRODE REM PT RTRN 9FT ADLT (ELECTROSURGICAL) ×2 IMPLANT
GAUZE 4X4 16PLY ~~LOC~~+RFID DBL (SPONGE) ×6 IMPLANT
GLOVE SURG ENC MOIS LTX SZ7 (GLOVE) ×3 IMPLANT
GLOVE SURG UNDER LTX SZ7.5 (GLOVE) ×3 IMPLANT
GOWN STRL REUS W/ TWL LRG LVL3 (GOWN DISPOSABLE) ×6 IMPLANT
GOWN STRL REUS W/ TWL XL LVL3 (GOWN DISPOSABLE) ×2 IMPLANT
GOWN STRL REUS W/TWL LRG LVL3 (GOWN DISPOSABLE) ×9
GOWN STRL REUS W/TWL XL LVL3 (GOWN DISPOSABLE) ×3
KIT TURNOVER CYSTO (KITS) ×3 IMPLANT
LABEL OR SOLS (LABEL) ×3 IMPLANT
MANIFOLD NEPTUNE II (INSTRUMENTS) ×3 IMPLANT
NEEDLE HYPO 22GX1.5 SAFETY (NEEDLE) ×3 IMPLANT
PACK BASIN MINOR ARMC (MISCELLANEOUS) ×3 IMPLANT
PAD OB MATERNITY 4.3X12.25 (PERSONAL CARE ITEMS) ×3 IMPLANT
PAD PREP 24X41 OB/GYN DISP (PERSONAL CARE ITEMS) ×3 IMPLANT
SCRUB EXIDINE 4% CHG 4OZ (MISCELLANEOUS) ×3 IMPLANT
SOL PREP PROV IODINE SCRUB 4OZ (MISCELLANEOUS) ×3 IMPLANT
SUT PDS 2-0 27IN (SUTURE) ×3 IMPLANT
SUT VIC AB 0 CT1 27 (SUTURE) ×9
SUT VIC AB 0 CT1 27XCR 8 STRN (SUTURE) ×6 IMPLANT
SUT VIC AB 0 CT1 36 (SUTURE) ×6 IMPLANT
SUT VIC AB 2-0 SH 27 (SUTURE) ×12
SUT VIC AB 2-0 SH 27XBRD (SUTURE) ×7 IMPLANT
SYR 10ML LL (SYRINGE) ×3 IMPLANT
SYR CONTROL 10ML LL (SYRINGE) ×3 IMPLANT
WATER STERILE IRR 1000ML POUR (IV SOLUTION) ×1 IMPLANT
WATER STERILE IRR 500ML POUR (IV SOLUTION) ×3 IMPLANT

## 2021-03-04 SURGICAL SUPPLY — 27 items
COUNTER NEEDLE 20/40 LG (NEEDLE) ×1 IMPLANT
CUP MEDICINE 2OZ PLAST GRAD ST (MISCELLANEOUS) ×2 IMPLANT
DRSG TELFA 3X8 NADH (GAUZE/BANDAGES/DRESSINGS) IMPLANT
ELECT BLADE 6.5 EXT (BLADE) ×1 IMPLANT
GAUZE 4X4 16PLY ~~LOC~~+RFID DBL (SPONGE) ×4 IMPLANT
GLOVE SURG ENC MOIS LTX SZ7 (GLOVE) ×2 IMPLANT
GOWN STRL REUS W/ TWL LRG LVL3 (GOWN DISPOSABLE) ×1 IMPLANT
GOWN STRL REUS W/TWL LRG LVL3 (GOWN DISPOSABLE) ×2
HANDLE YANKAUER SUCT BULB TIP (MISCELLANEOUS) ×1 IMPLANT
KIT TURNOVER CYSTO (KITS) ×2 IMPLANT
LABEL OR SOLS (LABEL) ×2 IMPLANT
MANIFOLD NEPTUNE II (INSTRUMENTS) ×2 IMPLANT
NS IRRIG 500ML POUR BTL (IV SOLUTION) ×2 IMPLANT
PACK DNC HYST (MISCELLANEOUS) ×2 IMPLANT
PAD DRESSING TELFA 3X8 NADH (GAUZE/BANDAGES/DRESSINGS) IMPLANT
PAD OB MATERNITY 4.3X12.25 (PERSONAL CARE ITEMS) ×2 IMPLANT
PAD PREP 24X41 OB/GYN DISP (PERSONAL CARE ITEMS) ×2 IMPLANT
PENCIL ELECTRO HAND CTR (MISCELLANEOUS) IMPLANT
SCRUB EXIDINE 4% CHG 4OZ (MISCELLANEOUS) ×2 IMPLANT
SET CYSTO W/LG BORE CLAMP LF (SET/KITS/TRAYS/PACK) IMPLANT
SOL PREP PVP 2OZ (MISCELLANEOUS) ×2
SOLUTION PREP PVP 2OZ (MISCELLANEOUS) ×1 IMPLANT
SURGILUBE 2OZ TUBE FLIPTOP (MISCELLANEOUS) ×2 IMPLANT
SUT VIC AB 0 CT1 27 (SUTURE) ×2
SUT VIC AB 0 CT1 27XCR 8 STRN (SUTURE) IMPLANT
TOWEL OR 17X26 4PK STRL BLUE (TOWEL DISPOSABLE) ×2 IMPLANT
WATER STERILE IRR 500ML POUR (IV SOLUTION) ×2 IMPLANT

## 2021-03-04 NOTE — Anesthesia Procedure Notes (Signed)
Procedure Name: Intubation Date/Time: 03/04/2021 5:27 PM Performed by: Jaye Beagle, CRNA Pre-anesthesia Checklist: Patient identified, Emergency Drugs available, Suction available and Patient being monitored Patient Re-evaluated:Patient Re-evaluated prior to induction Oxygen Delivery Method: Circle system utilized Preoxygenation: Pre-oxygenation with 100% oxygen Induction Type: IV induction and Rapid sequence Laryngoscope Size: McGraph and 3 Grade View: Grade I Tube type: Oral Tube size: 7.0 mm Number of attempts: 1 Airway Equipment and Method: Stylet and Oral airway Placement Confirmation: ETT inserted through vocal cords under direct vision, positive ETCO2 and breath sounds checked- equal and bilateral Secured at: 22 cm Tube secured with: Tape Dental Injury: Teeth and Oropharynx as per pre-operative assessment

## 2021-03-04 NOTE — Discharge Instructions (Signed)
AMBULATORY SURGERY  DISCHARGE INSTRUCTIONS   The drugs that you were given will stay in your system until tomorrow so for the next 24 hours you should not:  Drive an automobile Make any legal decisions Drink any alcoholic beverage   You may resume regular meals tomorrow.  Today it is better to start with liquids and gradually work up to solid foods.  You may eat anything you prefer, but it is better to start with liquids, then soup and crackers, and gradually work up to solid foods.   Please notify your doctor immediately if you have any unusual bleeding, trouble breathing, redness and pain at the surgery site, drainage, fever, or pain not relieved by medication.    Additional Instructions:  CALL [phone] DR. BEASLEY IF YOU HAVE ANY QUESTIONS OR PROBLEMS 24/7       Please contact your physician with any problems or Same Day Surgery at [phone], Monday through Friday 6 am to 4 pm, or [place] at [facility] number at [phone].

## 2021-03-04 NOTE — Transfer of Care (Signed)
Immediate Anesthesia Transfer of Care Note  Patient: Gloria [name]  Procedure(s) Performed: Procedure(s): EXAM UNDER ANESTHESIA (N/A)  Patient Location: PACU  Anesthesia Type:General  Level of Consciousness: sedated  Airway & Oxygen Therapy: Patient Spontanous Breathing and Patient connected to face mask oxygen  Post-op Assessment: Report given to RN and Post -op Vital signs reviewed and stable  Post vital signs: Reviewed and stable  Last Vitals:  Vitals:   03/04/21 1708 03/04/21 1809  BP: (!) 135/97 (!) 139/99  Pulse: 95 (!) 109  Resp: 18 13  Temp: 37.6 C 37.3 C  SpO2: 99% 100%    Complications: No apparent anesthesia complications

## 2021-03-04 NOTE — Transfer of Care (Signed)
Immediate Anesthesia Transfer of Care Note  Patient: Gloria [name]  Procedure(s) Performed: HYSTERECTOMY VAGINAL BILATERAL SALPINGECTOMY (Bilateral)  Patient Location: PACU  Anesthesia Type:General  Level of Consciousness: awake, alert  and oriented  Airway & Oxygen Therapy: Patient Spontanous Breathing and Patient connected to face mask oxygen  Post-op Assessment: Report given to RN and Post -op Vital signs reviewed and stable  Post vital signs: Reviewed and stable  Last Vitals:  Vitals Value Taken Time  BP 130/85 03/04/21 0942  Temp    Pulse 99 03/04/21 0944  Resp 17 03/04/21 0944  SpO2 100 % 03/04/21 0944  Vitals shown include unvalidated device data.  Last Pain:  Vitals:   03/04/21 0625  TempSrc: Oral  PainSc: 0-No pain         Complications: No notable events documented.

## 2021-03-04 NOTE — Op Note (Signed)
Efrain Sella PROCEDURE DATE: 03/04/2021  PREOPERATIVE DIAGNOSIS:   Menometrorrhagia POSTOPERATIVE DIAGNOSIS:   Same  SURGEON:   [name], M.D. ASSISTANT: Suzy Bouchard, M.D. OPERATION:  Total Vaginal Hysterectomy ANESTHESIA:  General endotracheal. Anesthesiologist: Anesthesiologist: Piscitello, Cleda Mccreedy, MD CRNA: Stormy Fabian, CRNA; Joanette Gula, Summer, CRNA  INDICATIONS: The patient is a 40 y.o. [id number] with history of AUB< failed conservative management, and 8 prior NSVDs. The patient made a decision to undergo definite surgical treatment. On the preoperative visit, the risks, benefits, indications, and alternatives of the procedure were reviewed with the patient.  On the day of surgery, the risks of surgery were again discussed with the patient including but not limited to: bleeding which may require transfusion or reoperation; infection which may require antibiotics; injury to bowel, bladder, ureters or other surrounding organs; need for additional procedures; thromboembolic phenomenon, incisional problems and other postoperative/anesthesia complications. Written informed consent was obtained.    OPERATIVE FINDINGS: A 10 week size uterus with normal ovaries bilaterally. No fallopian tubes found and she does have a hx of BTL  ESTIMATED BLOOD LOSS: 20 ml FLUIDS:  1000 ml of Lactated Ringers URINE OUTPUT:  30 ml of clear amber urine. SPECIMENS:  Uterus and cervix  sent to pathology COMPLICATIONS:  None immediate.  DESCRIPTION OF PROCEDURE:  The patient received prophylactic intravenous antibiotics and had sequential compression devices applied to her lower extremities while in the preoperative area.    She was taken to the operating room, where she was identified by name and birth date. General anesthesia was administered and was found to be adequate.  She was placed in the dorsal lithotomy position, and was prepped and draped in a sterile manner.  A formal time out  procedure was performed with all team members present and in agreement. A Foley catheter was inserted into her bladder and attached to gravity drainage. Attention was turned to her pelvis. Of note, all sutures used in this case were 0 Vicryl unless otherwise noted.     A weighted speculum was placed in the vagina, and the anterior and posterior lips of the cervix were grasped bilaterally with thyroid tenaculums.  The cervix was then injected circumferentially with 0.25% Marcaine with epinephrine solution to maintain hemostasis.  The posterior cul-de-sac was entered sharply in the midline.   A long weighted speculum was inserted into the posterior cul-de-sac. The cervix was circumferentially incised using electrocautery, and the bladder was dissected off the pubocervical fascia anteriorly with sharp and careful blunt dissection without complication. The Heaney clamp was then used to clamp the uterosacral ligaments on either side.  They were then cut and sutured ligated with 0 Vicryl, and the ligated uterosacral ligaments were transfixed to the ipsilateral vaginal epithelium to further support the vagina and provide hemostasis. The anterior cul-de-sac was then entered sharply without difficulty and the bladder retracted out of the operative field behind a retractor. The cardinal ligaments were then clamped, cut and ligated. The uterine vessels and broad ligaments were then serially clamped with the Heaney clamps, cut, and suture ligated on both sides.  Excellent hemostasis was noted at this point.    The uterus was then delivered via the posterior cul-de-sac, and the cornua were clamped with the Heaney clamps, transected, and the uterine specimen was delivered and sent to pathology. These pedicles were then suture ligated to ensure hemostasis.   The Fallopian tubes were absent. The ovaries were left intact and in place.  After completion of the hysterectomy, all pedicles  from the uterosacral ligament to the  cornua were examined hemostasis was confirmed.  The peritoneum was closed in a purse string fashion with 2-0 PDS, taking care not to incorporate any intraabdominal organs in the closure.The vaginal cuff was then closed with in a running locked fashion with care given to incorporate the uterosacral pedicles bilaterally, which were also tied in the midline.  All instruments were then removed from the pelvis.  The patient tolerated the procedure well.  All instruments, needles, and sponge counts were correct x 2. The patient was taken to the recovery room in stable condition.    Cline Cools, MD, MPH

## 2021-03-04 NOTE — Anesthesia Preprocedure Evaluation (Signed)
Anesthesia Evaluation  Patient identified by MRN, date of birth, ID band Patient awake  General Assessment Comment:  S/p vaginal hysterectomy earlier today, now with profuse bleeding per patient.  Ate a taco recently  Reviewed: Allergy & Precautions, NPO status , Patient's Chart, lab work & pertinent test results  History of Anesthesia Complications Negative for: history of anesthetic complications  Airway Mallampati: II  TM Distance: >3 FB Neck ROM: full    Dental  (+) Chipped, Poor Dentition   Pulmonary Current SmokerPatient did not abstain from smoking.,    Pulmonary exam normal breath sounds clear to auscultation       Cardiovascular Exercise Tolerance: Good hypertension, (-) angina(-) Past MI and (-) DOE Normal cardiovascular exam Rhythm:Regular Rate:Normal     Neuro/Psych negative neurological ROS  negative psych ROS   GI/Hepatic negative GI ROS, Neg liver ROS, neg GERD  ,  Endo/Other  negative endocrine ROS  Renal/GU      Musculoskeletal   Abdominal   Peds  Hematology negative hematology ROS (+)   Anesthesia Other Findings Past Medical History: No date: Pregnancy induced hypertension No date: Preterm labor No date: Vaginal Pap smear, abnormal  Past Surgical History: 12/30/2018: TUBAL LIGATION; Bilateral     Comment:  Procedure: POST PARTUM TUBAL LIGATION;  Surgeon: Ward,               Elenora Fender, MD;  Location: [facility];  Service: Gynecology;              Laterality: Bilateral;  BMI    Body Mass Index: 24.27 kg/m      Reproductive/Obstetrics negative OB ROS                             Anesthesia Physical  Anesthesia Plan  ASA: 3 and emergent  Anesthesia Plan: General   Post-op Pain Management: Ofirmev IV (intra-op) and Dilaudid IV   Induction: Intravenous  PONV Risk Score and Plan: 4 or greater and Ondansetron, Dexamethasone, Midazolam and Treatment may vary due  to age or medical condition  Airway Management Planned: Oral ETT  Additional Equipment: None  Intra-op Plan:   Post-operative Plan: Extubation in OR  Informed Consent: I have reviewed the patients History and Physical, chart, labs and discussed the procedure including the risks, benefits and alternatives for the proposed anesthesia with the patient or authorized representative who has indicated his/her understanding and acceptance.     Dental Advisory Given  Plan Discussed with: Anesthesiologist, CRNA and Surgeon  Anesthesia Plan Comments: (Patient consented for risks of anesthesia including but not limited to:  - adverse reactions to medications - blood transfusion and associated risks - damage to eyes, teeth, lips or other oral mucosa - nerve damage due to positioning  - sore throat or hoarseness - Damage to heart, brain, nerves, lungs, other parts of body or loss of life  Patient voiced understanding.)        Anesthesia Quick Evaluation

## 2021-03-04 NOTE — Anesthesia Postprocedure Evaluation (Signed)
Anesthesia Post Note  Patient: Gloria [name]  Procedure(s) Performed: Francia Greaves UNDER ANESTHESIA (Vagina )  Patient location during evaluation: PACU Anesthesia Type: General Level of consciousness: awake and alert Pain management: pain level controlled Vital Signs Assessment: post-procedure vital signs reviewed and stable Respiratory status: spontaneous breathing, nonlabored ventilation, respiratory function stable and patient connected to nasal cannula oxygen Cardiovascular status: blood pressure returned to baseline and stable Postop Assessment: no apparent nausea or vomiting Anesthetic complications: no   No notable events documented.   Last Vitals:  Vitals:   03/04/21 1815 03/04/21 1830  BP: 132/72   Pulse: 86   Resp: 18   Temp:    SpO2: 100% 98%    Last Pain:  Vitals:   03/04/21 1830  TempSrc:   PainSc: 6                  Corinda Gubler

## 2021-03-04 NOTE — H&P (Addendum)
  Chief Complaint:   Gloria [name] is a 40 y.o. female presenting with heavy vaginal bleeding after vaginal hyste this morning.    History of Present Illness: When she walked this afternoon, heavy vaginal bleeding started several hours after getting home. EBL from surgery: 20cc  She denies nausea, vomiting, abdominal pain. Endorses significant pelvic pain in her lower back and vagina. She is chilled and shaky.   Past Medical History:  has a past medical history of Pregnancy induced hypertension, Preterm labor, and Vaginal Pap smear, abnormal.  Past Surgical History:  has a past surgical history that includes Tubal ligation (Bilateral, 12/30/2018). Family History: family history includes Arthritis in her father and paternal grandmother; Cancer in her maternal grandfather; Diabetes in her mother and paternal grandmother. Social History:  reports that she has been smoking cigarettes. She has been smoking an average of 1 pack per day. She has never used smokeless tobacco. She reports current alcohol use of about 2.0 standard drinks per week. She reports that she does not use drugs. OB/GYN History:  OB History     Gravida  8   Para  8   Term  6   Preterm  2   AB      Living  8      SAB      IAB      Ectopic      Multiple  0   Live Births  8          Allergies: has No Known Allergies. Medications:No current facility-administered medications for this encounter.  Review of Systems: No SOB, no palpitations or chest pain,no nausea or vomiting or bowel or bladder complaints. See HPI for gyn specific ROS.   Exam:   Temp 99.7 HR 87 O2 sats 99% RR 20 BP: 135/97  General: Patient is well-groomed, well-nourished, appears stated age in no acute distress, but chilled and anxious   HEENT: head is atraumatic and normocephalic, trachea is midline, neck is supple with no palpable nodules   CV: Regular rhythm and mildly tachycardic, no murmur   Pulm: Clear to auscultation  throughout lung fields with no wheezing, crackles, or rhonchi. No increased work of breathing  Abdomen: soft , no mass, non-tender, no rebound tenderness,   Pelvic: Vault filled with clots. Active bright red bleeding, likely >100cc immediately out with active bright red bleeding following. Cuff not able to be visualized   Impression:   Heavy vaginal bleeding following hysterectomy   Plan:   Back to the OR for exam under anesthesia, repair of vaginal cuff. Consents for same signed. Emergent case, anesthesia at bedside, aware of recent taco ingestion. If I had vaginal packing I would have packed in the office, because of the amount of bleeding. - blood transfusion possible, consents signed, type and screen still active per blood bank - will repeat Ancef due to bleeding amount -husband at bedside -not likely intraperitoneal injury, but if needed will open the peritoneal cavity   [name], MD

## 2021-03-04 NOTE — Op Note (Signed)
Efrain Sella PROCEDURE DATE: 03/04/2021  Indication: Vaginal bleeding after hysterectomy 10 hours ago PREOPERATIVE DIAGNOSIS: Vaginal cuff bleeding POSTOPERATIVE DIAGNOSIS: The same PROCEDURE:  - Exam under anesthesia - Vaginal cuff revision  SURGEON:  Dr. Cline Cools  ASSISTANT: CST ANESTHESIOLOGIST:  Anesthesiologist: Corinda Gubler, MD CRNA: Stormy Fabian, CRNA; Jaye Beagle, CRNA  INDICATIONS: 40 y.o. [id number] who began bleeding heavily several hours after vaginal hysterectomy.     Risks of surgery were discussed with the patient including but not limited to: bleeding which may require transfusion or reoperation; infection which may require antibiotics; injury to bowel, bladder, ureters or other surrounding organs; need for additional procedures including laparotomy; thromboembolic phenomenon, incisional problems and other postoperative/anesthesia complications. Written informed consent was obtained.    FINDINGS:  Intact vaginal cuff with bright red bleeding welling from the cuff edges at the superior portion. No evidence of cuff opening or peritoneal disruption with cuff dehiscence noted.  ANESTHESIA:    General INTRAVENOUS FLUIDS: 1000 ml ESTIMATED BLOOD LOSS: 300 ml extracted from the vaginal vault, and 20cc for the procedure itself SPECIMENS: None COMPLICATIONS: None immediate  PROCEDURE IN DETAIL:  2g Ancef given. The patient was brought to the OR where she had sequential compression devices applied to her lower extremities while in the preoperative area.  General LMA anesthesia was administered and was found to be adequate.  She was placed in the dorsal lithotomy position, and was prepped and draped in a sterile manner.  A Foley catheter was inserted into her bladder an estimated amount of clear urine was drained.  After an adequate timeout was performed, an exam was undertaken that showed the above findings. A weighted speculum was placed in the vagina, and several  figure-of-eight interrupted stitches were placed at the areas of bleeding, and the rest of the cuff oversewn minimally to the inferior angle. Very minimal cautery was used to assure hemostasis. A full two minutes of monitoring without welling noted prior to termination of procedure.  The patient tolerated the procedures well.  All instruments, needles, and sponge counts were correct x 2. The patient was taken to the recovery room awake, extubated and in stable condition.

## 2021-03-04 NOTE — Anesthesia Postprocedure Evaluation (Signed)
Anesthesia Post Note  Patient: Gloria [name]  Procedure(s) Performed: HYSTERECTOMY VAGINAL  Patient location during evaluation: PACU Anesthesia Type: General Level of consciousness: awake and alert Pain management: pain level controlled Vital Signs Assessment: post-procedure vital signs reviewed and stable Respiratory status: spontaneous breathing, nonlabored ventilation, respiratory function stable and patient connected to nasal cannula oxygen Cardiovascular status: blood pressure returned to baseline and stable Postop Assessment: no apparent nausea or vomiting Anesthetic complications: no   No notable events documented.   Last Vitals:  Vitals:   03/04/21 1030 03/04/21 1053  BP: 122/86 129/78  Pulse: 77 89  Resp: 14 15  Temp: 37.1 C 37.1 C  SpO2: 99% 99%    Last Pain:  Vitals:   03/04/21 1053  TempSrc: Temporal  PainSc: 4                  Cleda Mccreedy Yulitza Shorts

## 2021-03-04 NOTE — Interval H&P Note (Signed)
History and Physical Interval Note:  03/04/2021 7:27 AM  Gloria [name]  has presented today for surgery, with the diagnosis of menorrhagia.  The various methods of treatment have been discussed with the patient and family. After consideration of risks, benefits and other options for treatment, the patient has consented to  Procedure(s): HYSTERECTOMY VAGINAL (N/A) BILATERAL SALPINGECTOMY (Bilateral) as a surgical intervention.  The patient's history has been reviewed, patient examined, no change in status, stable for surgery.  I have reviewed the patient's chart and labs.  Questions were answered to the patient's satisfaction.     [name]

## 2021-03-04 NOTE — Anesthesia Preprocedure Evaluation (Signed)
Anesthesia Evaluation  Patient identified by MRN, date of birth, ID band Patient awake    Reviewed: Allergy & Precautions, NPO status , Patient's Chart, lab work & pertinent test results  History of Anesthesia Complications Negative for: history of anesthetic complications  Airway Mallampati: II  TM Distance: >3 FB Neck ROM: full    Dental  (+) Chipped, Poor Dentition   Pulmonary Current Smoker and Patient abstained from smoking.,    Pulmonary exam normal        Cardiovascular Exercise Tolerance: Good hypertension, (-) angina(-) Past MI and (-) DOE Normal cardiovascular exam     Neuro/Psych negative neurological ROS  negative psych ROS   GI/Hepatic negative GI ROS, Neg liver ROS, neg GERD  ,  Endo/Other  negative endocrine ROS  Renal/GU      Musculoskeletal   Abdominal   Peds  Hematology negative hematology ROS (+)   Anesthesia Other Findings Past Medical History: No date: Pregnancy induced hypertension No date: Preterm labor No date: Vaginal Pap smear, abnormal  Past Surgical History: 12/30/2018: TUBAL LIGATION; Bilateral     Comment:  Procedure: POST PARTUM TUBAL LIGATION;  Surgeon: Ward,               Elenora Fender, MD;  Location: [facility] OR[facility];  Service: Gynecology;              Laterality: Bilateral;  BMI    Body Mass Index: 24.27 kg/m      Reproductive/Obstetrics negative OB ROS                             Anesthesia Physical Anesthesia Plan  ASA: 2  Anesthesia Plan: General ETT   Post-op Pain Management:    Induction: Intravenous  PONV Risk Score and Plan: Ondansetron, Dexamethasone, Midazolam and Treatment may vary due to age or medical condition  Airway Management Planned: Oral ETT  Additional Equipment:   Intra-op Plan:   Post-operative Plan: Extubation in OR  Informed Consent: I have reviewed the patients History and Physical, chart, labs and discussed the  procedure including the risks, benefits and alternatives for the proposed anesthesia with the patient or authorized representative who has indicated his/her understanding and acceptance.     Dental Advisory Given  Plan Discussed with: Anesthesiologist, CRNA and Surgeon  Anesthesia Plan Comments: (Patient consented for risks of anesthesia including but not limited to:  - adverse reactions to medications - damage to eyes, teeth, lips or other oral mucosa - nerve damage due to positioning  - sore throat or hoarseness - Damage to heart, brain, nerves, lungs, other parts of body or loss of life  Patient voiced understanding.)        Anesthesia Quick Evaluation

## 2021-03-04 NOTE — Progress Notes (Signed)
Patient is doing great and no bleeding noted on pad or on bed. Patient assessed with Dr. Dalbert Garnet, and pain is much better and not nearly as severe as before. Abdomen is soft and no bleeding noted with palpation of pelvic region. Patient, husband, and Dr. Dalbert Garnet have decided for the patient to go home, and will receive a call in the morning for an update. Dr. Francena Hanly direct office number given to patient if any problems arise.

## 2021-03-04 NOTE — Anesthesia Procedure Notes (Signed)
Procedure Name: Intubation Date/Time: 03/04/2021 7:38 AM Performed by: Joanette Gula, Devante Capano, CRNA Pre-anesthesia Checklist: Patient identified, Emergency Drugs available, Suction available and Patient being monitored Patient Re-evaluated:Patient Re-evaluated prior to induction Oxygen Delivery Method: Circle system utilized Preoxygenation: Pre-oxygenation with 100% oxygen Induction Type: IV induction Ventilation: Mask ventilation without difficulty Laryngoscope Size: McGraph and 3 Grade View: Grade I Tube type: Oral Tube size: 7.0 mm Number of attempts: 1 Airway Equipment and Method: Stylet Placement Confirmation: ETT inserted through vocal cords under direct vision, positive ETCO2 and breath sounds checked- equal and bilateral Secured at: 21 cm Tube secured with: Tape Dental Injury: Teeth and Oropharynx as per pre-operative assessment

## 2021-03-04 NOTE — Interval H&P Note (Signed)
History and Physical Interval Note:  03/04/2021 7:29 AM  Gloria [name]  has presented today for surgery, with the diagnosis of menorrhagia.  The various methods of treatment have been discussed with the patient and family. After consideration of risks, benefits and other options for treatment, the patient has consented to  Procedure(s): HYSTERECTOMY VAGINAL (N/A) BILATERAL SALPINGECTOMY (Bilateral) as a surgical intervention.  The patient's history has been reviewed, patient examined, no change in status, stable for surgery.  I have reviewed the patient's chart and labs.  Questions were answered to the patient's satisfaction.    Please note that the H&P Assessment and plan was incorrect and noted at laparoscopic surgery. The plan is for a vaginal hysterectomy.  [name]

## 2021-03-04 NOTE — Discharge Instructions (Addendum)
Discharge instructions after  vaginal hysterectomy  For the next three days, take ibuprofen and acetaminophen on a schedule, every 8 hours. You can take them together or you can intersperse them, and take one every four hours. I also gave you gabapentin for nighttime, to help you sleep and also to control pain. Take gabapentin medicines at night for at least the next 3 nights. You also have a narcotic, oxycodone, to take as needed if the above medicines don't help.  Postop constipation is a major cause of pain. Stay well hydrated, walk as you tolerate, and take over the counter senna as well as stool softeners if you need them.   Signs and Symptoms to Report Call our office at [phone] if you have any of the following.   Fever over 100.4 degrees or higher  Severe stomach pain not relieved with pain medications  Bright red bleeding that's heavier than a period that does not slow with rest  To go the bathroom a lot (frequency), you can't hold your urine (urgency), or it hurts when you empty your bladder (urinate)  Chest pain  Shortness of breath  Pain in the calves of your legs  Severe nausea and vomiting not relieved with anti-nausea medications  Signs of infection around your wounds, such as redness, hot to touch, swelling, green/yellow drainage (like pus), bad smelling discharge  Any concerns  What You Can Expect after Surgery  You may see some pink tinged, bloody fluid and bruising around the wound. This is normal.  You may have a sore throat because of the tube in your mouth during general anesthesia. This will go away in 2 to 3 days.  You may have some stomach cramps.  You may notice spotting on your panties.   Activities after Your Discharge Follow these guidelines to help speed your recovery at home:  Do the coughing and deep breathing as you did in the hospital for 2 weeks. Use the small blue breathing device, called the incentive spirometer for 2 weeks.  Don't drive if  you are in pain or taking narcotic pain medicine. You may drive when you can safely slam on the brakes, turn the wheel forcefully, and rotate your torso comfortably. This is typically 1-2 weeks. Practice in a parking lot or side street prior to attempting to drive regularly.   Ask others to help with household chores for 4 weeks.  Do not lift anything heavier that 10 pounds for 4-6 weeks. This includes pets, children, and groceries.  Don't do strenuous activities, exercises, or sports like vacuuming, tennis, squash, etc. until your doctor says it is safe to do so. ---Maintain pelvic rest for 8 weeks. This means nothing in the vagina or rectum at all (no douching, tampons, intercourse) for 8 weeks.   Walk as you feel able. Rest often since it may take two or three weeks for your energy level to return to normal.   You may climb stairs  Avoid constipation:   -Eat fruits, vegetables, and whole grains. Eat small meals as your appetite will take time to return to normal.   -Drink 6 to 8 glasses of water each day unless your doctor has told you to limit your fluids.   -Use a laxative or stool softener as needed if constipation becomes a problem. You may take Miralax, metamucil, Citrucil, Colace, Senekot, FiberCon, etc. If this does not relieve the constipation, try two tablespoons of Milk Of Magnesia every 8 hours until your bowels move.  You may shower. Gently wash the wounds with a mild soap and water. Pat dry.  Do not get in a hot tub, swimming pool, etc. for 6 weeks.  Do not use lotions, oils, powders on the wounds.  Do not douche, use tampons, or have sex until your doctor says it is okay.  Take your pain medicine when you need it. The medicine may not work as well if the pain is bad.  Take the medicines you were taking before surgery. Other medications you will need are pain medications (Norco or Percocet) and nausea medications (Zofran).     Here is a helpful article from the website  http://mitchell.org/, regarding constipation  Here are reasons why constipation occurs after surgery: 1) During the operation and in the recovery room, most people are given opioid pain medication, primarily through an IV, to treat moderate or severe pain. Intravenous opioids include morphine, Dilaudid and fentanyl. After surgery, patients are often prescribed opioid pain medication to take by mouth at home, including codeine, Vicodin, Norco, and Percocet. All of these medications cause constipation by slowing down the movement of your intestine. 2) Changes in your diet before surgery can be another culprit. It is common to get specific instructions to change how you normally eat or drink before your surgery, like only having liquids the day before or not having anything to eat or drink after midnight the night before surgery. For this reason, temporary dehydration may occur. This, along with not eating or only having liquids, means that you are getting less fiber than usual. Both these factors contribute to constipation. 3) Changes in your diet after surgery can also contribute to the problem. Although many people don't have dietary restrictions after operations, being under anesthesia can make you lose your appetite for several hours and maybe even days. Some people can even have nausea or vomiting. Not eating or drinking normally means that you are not getting enough fiber and you can get dehydrated, both leading to constipation. 4) Lying in a bed more than usual--which happens before, during and after surgery--combined with the medications and diet changes, all work together to slow down your colon and make your poop turn to rock.  No one likes to be constipated.  Let's face it, it's not a pleasant feeling when you don't poop for days, then strain on the toilet to finally pass something large enough to cause damage. An ounce of prevention is worth a pound of cure, so: Assume you will be constipated. Plan  and prepare accordingly. Post-surgery is one of those unique situations where the temporary use of laxatives can make a world of difference. Always consult with your doctor, and recognize that if you wait several days after surgery to take a laxative, the constipation might be too severe for these over-the-counter options. It is always important to discuss all medications you plan on taking with your doctor. Ask your doctor if you can start the laxative immediately after surgery. *  Here are go-to post-surgery laxatives: Senna: Senna is an herb that acts as a "stimulant laxative," meaning it increases the activity of the intestine to cause you to have a bowel movement. It comes in many forms, but senna pills are easy to take and are sold over the counter at almost all pharmacies. Since opioid pain medications slow down the activity of the intestine, it makes sense to take a medication to help reverse that side effect. Long-term use of a stimulant laxative is not a good idea  since it can make your colon "lazy" and not function properly; however, temporary use immediately after surgery is acceptable. In general, if you are able to eat a normal diet, taking senna soon after surgery works the best. Senna usually works within hours to produce a bowel movement, but this is less predictable when you are taking different medications after surgery. Try not to wait several days to start taking senna, as often it is too late by then. Just like with all medications or supplements, check with your doctor before starting new treatment.   Magnesium: Magnesium is an important mineral that our body needs. We get magnesium from some foods that we eat, especially foods that are high in fiber such as broccoli, almonds and whole grains. There are also magnesium-based medications used to treat constipation including milk of magnesia (magnesium hydroxide), magnesium citrate and magnesium oxide. They work by drawing water into the  intestine, putting it into the class of "osmotic" laxatives. Magnesium products in low doses appear to be safe, but if taken in very large doses, can lead to problems such as irregular heartbeat, low blood pressure and even death. It can also affect other medications you might be taking, therefore it is important to discuss using magnesium with your physician and pharmacist before initiating therapy. Most over-the-counter magnesium laxatives work very well to help with the constipation related to surgery, but sometimes they work too well and lead to diarrhea. Make sure you are somewhere with easy access to a bathroom, just in case.   Bisacodyl: Bisacodyl (generic name) is sold under brand names such as Dulcolax. Much like senna, it is a "stimulant laxative," meaning it makes your intestines move more quickly to push out the stool. This is another good choice to start taking as soon as your doctor says you can take a laxative after surgery. It comes in pill form and as a suppository, which is a good choice for people who cannot or are not allowed to swallow pills. Studies have shown that it works as a laxative, but like most of these medications, you should use this on a short-term basis only.   Enema: Enemas strike fear in many people, but FEAR NOT! It's nowhere near as big a deal as you may think. An enema is just a way to get some liquid into your rectum by placing a specially designed device through your anus. If you have never done one, it might seem like a painful, unpleasant, uncomfortable, complicated and lengthy procedure. But in reality, it's simple, takes just a few seconds and is highly effective. The small ready-made bottles you buy at the pharmacy are much easier than the hose/large rubber container type. Those recommended positions illustrated in some instructions are generally not necessary to place the enema. It's very similar to the insertion of a tampon, requiring a slight squat. Some extra  lubrication on the enema's tip (or on your anus) will make it a breeze. In certain cases, there is no substitute for a good enema. For example, if someone has not pooped for a few days, the beginning of the poop waiting to come out can become rock hard. Passing that hard stool can lead to much pain and problems like anal fissures. Inserting a little liquid to break up the rock-hard stool will help make its passage much easier. Enemas come with different liquids. Most come with saline, but there are also mineral oil options. You can also use warm water in the reusable enema containers. They  all work. But since saline can sometimes be irritating, so try a mineral oil or water enema instead.  Here are commonly recommended constipation medications that do not work well for post-surgery constipation: Docusate: Docusate (generic name) most commonly referred to as Colace (brand name) is not really a laxative, but is classified as a stool softener. Although this medication is commonly prescribed, it is not recommended for several reasons: 1) there is no good medical evidence that it works 2) even if it has an effect, which is very questionable, it is minimal and cannot combat the intestinal slowing caused by the opioid medications. Skip docusate to save money and space in your pillbox for something more effective.  PEG: Miralax (brand name) is basically a chemical called polyethylene glycol (PEG) and it has gained tremendous popularity as a laxative. This product is an "osmotic laxative" meaning it works by pulling water into the stool, making it softer. This is very similar to the action of natural fiber in foods and supplements. Therefore, the effect seen by this medication is not immediate, causing a bowel movement in a day or more. Is this medication strong enough to battle the constipation related to having an operation? Maybe for some people not prone to constipation. But for most people, other laxatives are  better to prevent constipation after surgery. AMBULATORY SURGERY  DISCHARGE INSTRUCTIONS   The drugs that you were given will stay in your system until tomorrow so for the next 24 hours you should not:  Drive an automobile Make any legal decisions Drink any alcoholic beverage   You may resume regular meals tomorrow.  Today it is better to start with liquids and gradually work up to solid foods.  You may eat anything you prefer, but it is better to start with liquids, then soup and crackers, and gradually work up to solid foods.   Please notify your doctor immediately if you have any unusual bleeding, trouble breathing, redness and pain at the surgery site, drainage, fever, or pain not relieved by medication.    Additional Instructions:    Please contact your physician with any problems or Same Day Surgery at [phone], Monday through Friday 6 am to 4 pm, or [place] [facility] number at [phone].

## 2021-03-05 ENCOUNTER — Encounter: Payer: Self-pay | Admitting: Obstetrics and Gynecology

## 2021-03-05 LAB — SURGICAL PATHOLOGY

## 2021-06-06 ENCOUNTER — Other Ambulatory Visit (HOSPITAL_COMMUNITY): Payer: Self-pay | Admitting: Physician Assistant

## 2021-06-06 ENCOUNTER — Other Ambulatory Visit: Payer: Self-pay | Admitting: Physician Assistant

## 2021-06-12 ENCOUNTER — Ambulatory Visit: Payer: Medicaid Other

## 2021-06-16 ENCOUNTER — Inpatient Hospital Stay
Admission: EM | Admit: 2021-06-16 | Discharge: 2021-06-18 | DRG: 872 | Disposition: A | Discharge status: Home / Self Care | Payer: Medicaid Other | Attending: Internal Medicine | Admitting: Internal Medicine

## 2021-06-16 ENCOUNTER — Other Ambulatory Visit: Payer: Self-pay

## 2021-06-16 ENCOUNTER — Encounter: Payer: Self-pay | Admitting: Internal Medicine

## 2021-06-16 ENCOUNTER — Emergency Department: Payer: Medicaid Other

## 2021-06-16 DIAGNOSIS — N10 Acute tubulo-interstitial nephritis: Secondary | ICD-10-CM | POA: Diagnosis present

## 2021-06-16 DIAGNOSIS — M4802 Spinal stenosis, cervical region: Secondary | ICD-10-CM | POA: Diagnosis present

## 2021-06-16 DIAGNOSIS — Z8261 Family history of arthritis: Secondary | ICD-10-CM | POA: Diagnosis not present

## 2021-06-16 DIAGNOSIS — A419 Sepsis, unspecified organism: Secondary | ICD-10-CM

## 2021-06-16 DIAGNOSIS — Z833 Family history of diabetes mellitus: Secondary | ICD-10-CM | POA: Diagnosis not present

## 2021-06-16 DIAGNOSIS — Z720 Tobacco use: Secondary | ICD-10-CM | POA: Diagnosis not present

## 2021-06-16 DIAGNOSIS — M5412 Radiculopathy, cervical region: Secondary | ICD-10-CM | POA: Diagnosis present

## 2021-06-16 DIAGNOSIS — F17210 Nicotine dependence, cigarettes, uncomplicated: Secondary | ICD-10-CM | POA: Diagnosis present

## 2021-06-16 DIAGNOSIS — Z79899 Other long term (current) drug therapy: Secondary | ICD-10-CM

## 2021-06-16 DIAGNOSIS — Z90710 Acquired absence of both cervix and uterus: Secondary | ICD-10-CM | POA: Diagnosis not present

## 2021-06-16 DIAGNOSIS — G992 Myelopathy in diseases classified elsewhere: Secondary | ICD-10-CM | POA: Diagnosis present

## 2021-06-16 DIAGNOSIS — A4151 Sepsis due to Escherichia coli [E. coli]: Secondary | ICD-10-CM | POA: Diagnosis present

## 2021-06-16 DIAGNOSIS — G43909 Migraine, unspecified, not intractable, without status migrainosus: Secondary | ICD-10-CM | POA: Diagnosis present

## 2021-06-16 DIAGNOSIS — N12 Tubulo-interstitial nephritis, not specified as acute or chronic: Secondary | ICD-10-CM

## 2021-06-16 HISTORY — DX: Acute pyelonephritis: N10

## 2021-06-16 LAB — COMPREHENSIVE METABOLIC PANEL
ALT: 32 U/L (ref 0–44)
AST: 19 U/L (ref 15–41)
Albumin: 3.9 g/dL (ref 3.5–5.0)
Alkaline Phosphatase: 70 U/L (ref 38–126)
Anion gap: 10 (ref 5–15)
BUN: 10 mg/dL (ref 6–20)
CO2: 23 mmol/L (ref 22–32)
Calcium: 8.9 mg/dL (ref 8.9–10.3)
Chloride: 100 mmol/L (ref 98–111)
Creatinine, Ser: 0.71 mg/dL (ref 0.44–1.00)
GFR, Estimated: >60 mL/min (ref >60)
Glucose, Bld: 124 mg/dL — ABNORMAL HIGH (ref 70–99)
Potassium: 3.7 mmol/L (ref 3.5–5.1)
Sodium: 133 mmol/L — ABNORMAL LOW (ref 135–145)
Total Bilirubin: 1.1 mg/dL (ref 0.3–1.2)
Total Protein: 7.5 g/dL (ref 6.5–8.1)

## 2021-06-16 LAB — LACTIC ACID, PLASMA: Lactic Acid, Venous: 0.9 mmol/L (ref 0.5–1.9)

## 2021-06-16 LAB — URINALYSIS, ROUTINE W REFLEX MICROSCOPIC
Bilirubin Urine: NEGATIVE
Glucose, UA: NEGATIVE mg/dL
Ketones, ur: NEGATIVE mg/dL
Nitrite: POSITIVE — AB
Protein, ur: 100 mg/dL — AB
Specific Gravity, Urine: 1.016 (ref 1.005–1.030)
WBC, UA: >50 WBC/hpf — ABNORMAL HIGH (ref 0–5)
pH: 5 (ref 5.0–8.0)

## 2021-06-16 LAB — CBC
HCT: 42.8 % (ref 36.0–46.0)
Hemoglobin: 14.5 g/dL (ref 12.0–15.0)
MCH: 30.7 pg (ref 26.0–34.0)
MCHC: 33.9 g/dL (ref 30.0–36.0)
MCV: 90.5 fL (ref 80.0–100.0)
Platelets: 246 10*3/uL (ref 150–400)
RBC: 4.73 MIL/uL (ref 3.87–5.11)
RDW: 12.6 % (ref 11.5–15.5)
WBC: 19.4 10*3/uL — ABNORMAL HIGH (ref 4.0–10.5)
nRBC: 0 % (ref 0.0–0.2)

## 2021-06-16 LAB — LIPASE, BLOOD: Lipase: 25 U/L (ref 11–51)

## 2021-06-16 LAB — HIV ANTIBODY (ROUTINE TESTING W REFLEX): HIV Screen 4th Generation wRfx: NONREACTIVE

## 2021-06-16 LAB — PROCALCITONIN: Procalcitonin: 0.11 ng/mL

## 2021-06-16 LAB — POC URINE PREG, ED: Preg Test, Ur: NEGATIVE

## 2021-06-16 IMAGING — CT CT ABD-PELV W/ CM
2 of 4 series · 16 of 46 positions shown, 18 images · IV contrast (APPLIED)
Comparison: No priors.

CLINICAL DATA: 40-year-old female with history of right-sided
abdominal pain and emesis. Some findings of both cholecystitis and
appendicitis on physical examination.

EXAM:
CT ABDOMEN AND PELVIS WITH CONTRAST
TECHNIQUE: Multidetector CT imaging of the abdomen and pelvis was performed
using the standard protocol following bolus administration of
intravenous contrast.

[Series 2: abdomen 5.0 · axial · 0.66mm/px · z∈[-954,-564]mm · 13 of 88 slices shown, 15 images]
[im 5/88  soft-tissue]
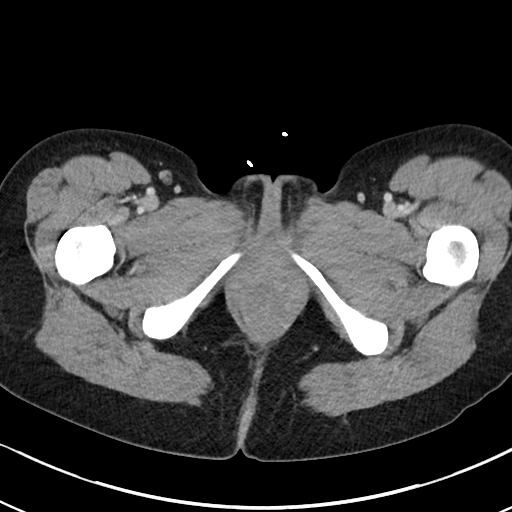
[im 5/88  bone]
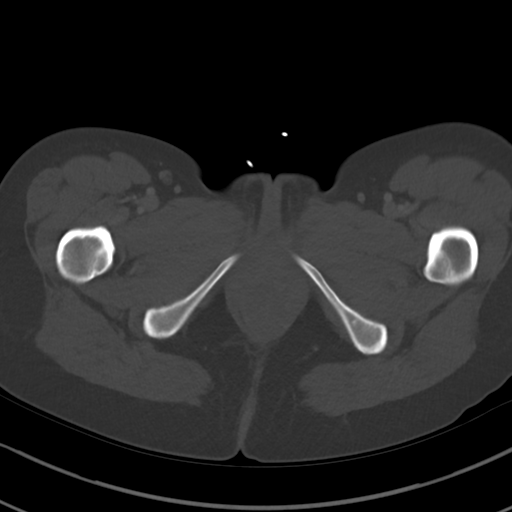
[im 14/88  soft-tissue]
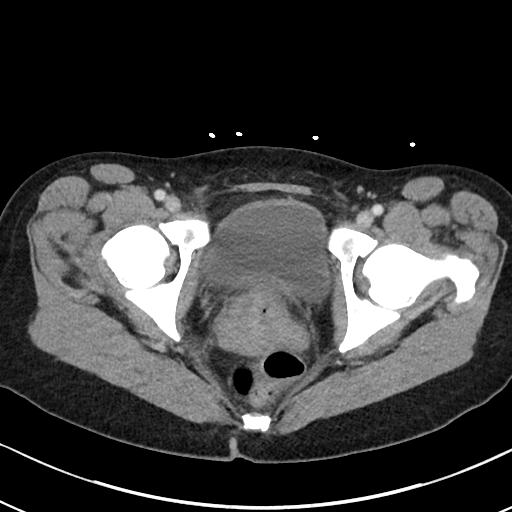
[im 19/88  soft-tissue]
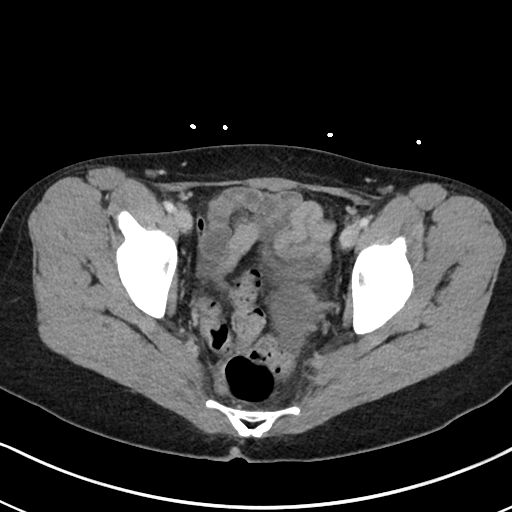
[im 23/88  soft-tissue]
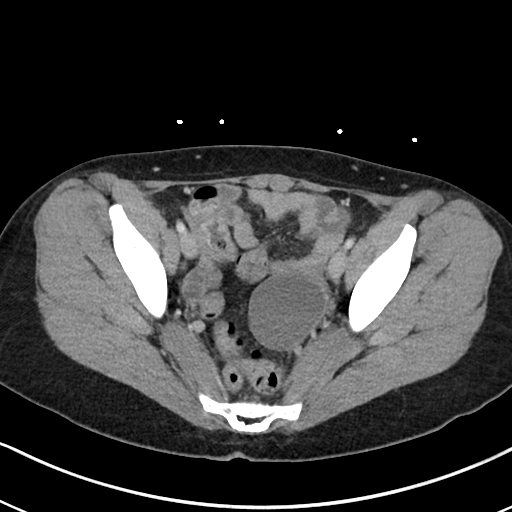
[im 33/88  soft-tissue]
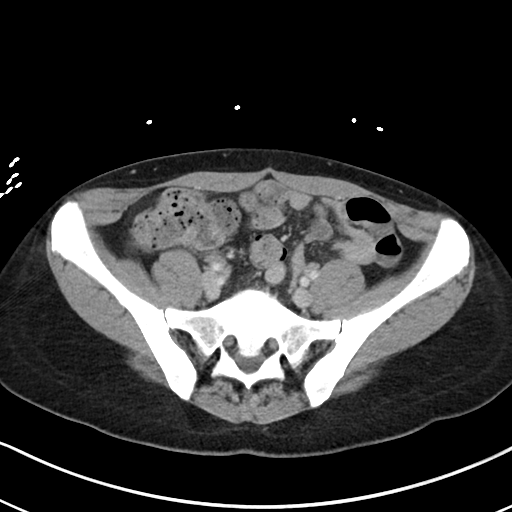
[im 37/88  soft-tissue]
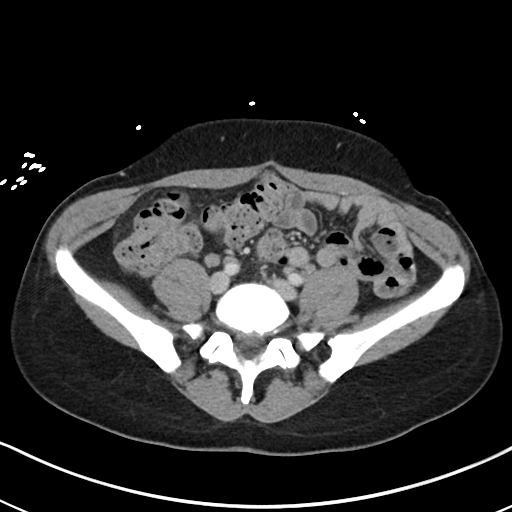
[im 46/88  soft-tissue]
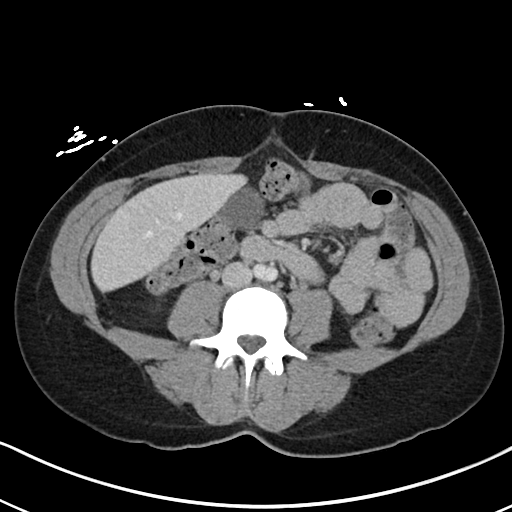
[im 51/88  soft-tissue]
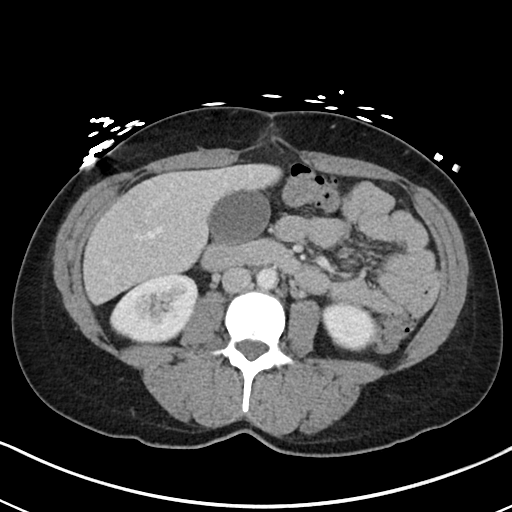
[im 55/88  soft-tissue]
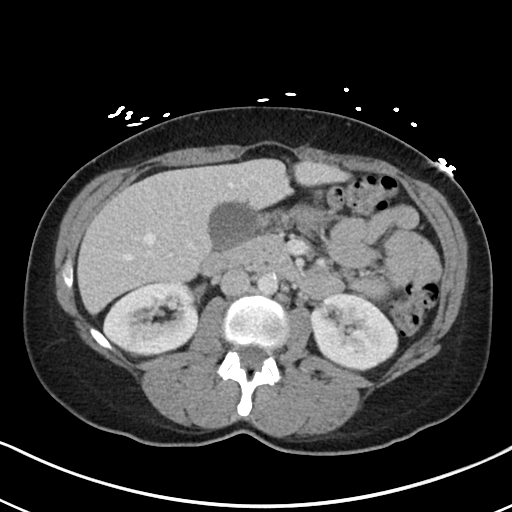
[im 55/88  bone]
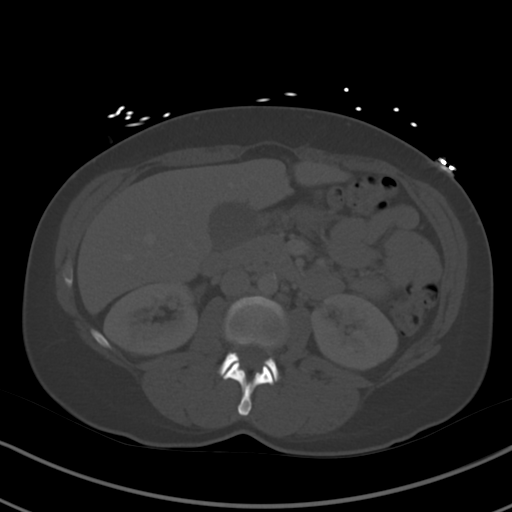
[im 65/88  soft-tissue]
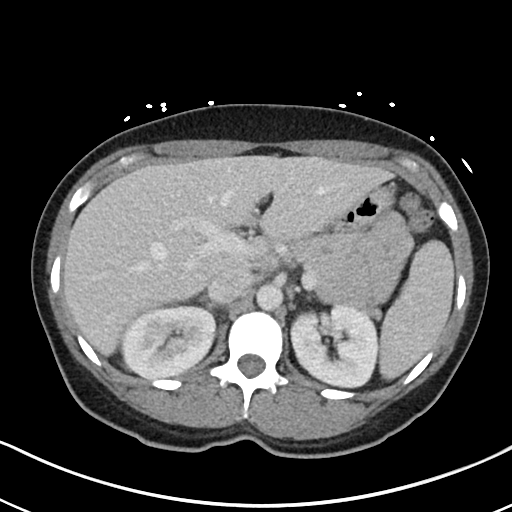
[im 69/88  soft-tissue]
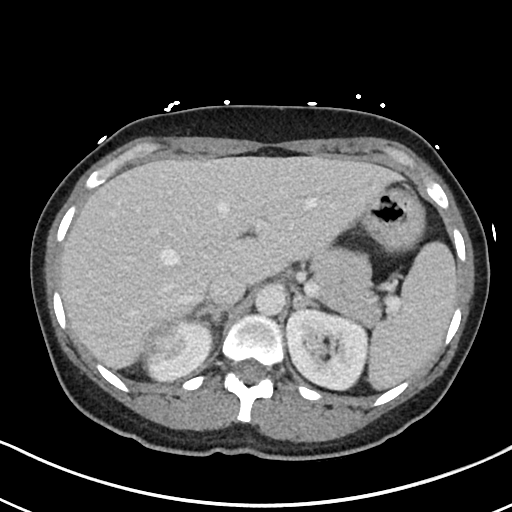
[im 74/88  soft-tissue]
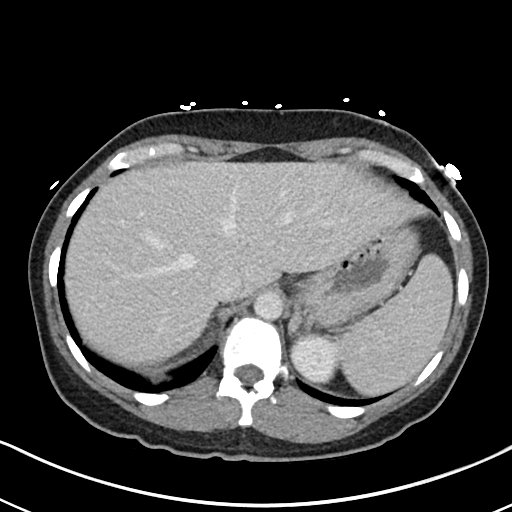
[im 83/88  soft-tissue]
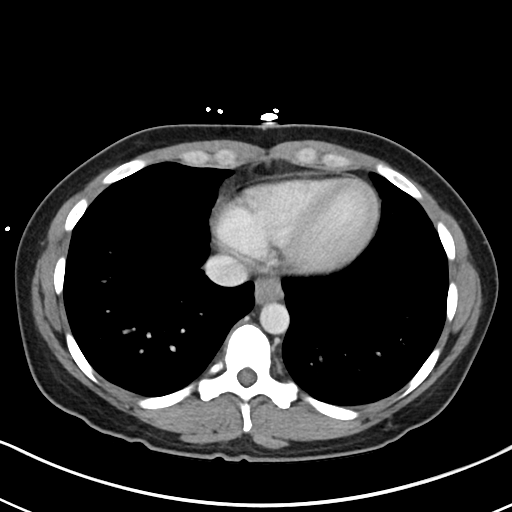

[Series 5: abdomen 3.0 mpr cor · coronal · 0.79mm/px · 3 of 72 slices shown]
[im 24/72  soft-tissue]
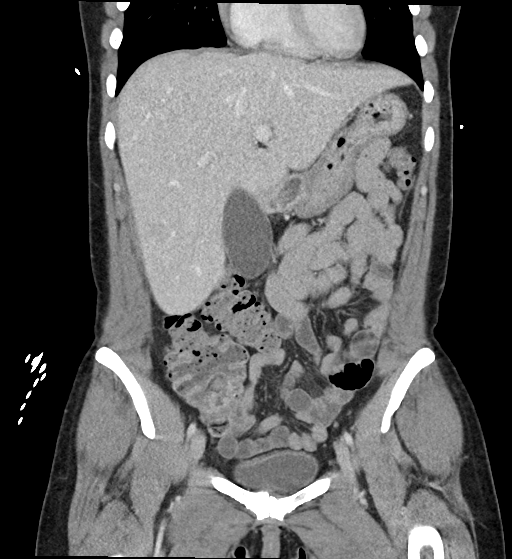
[im 32/72  soft-tissue]
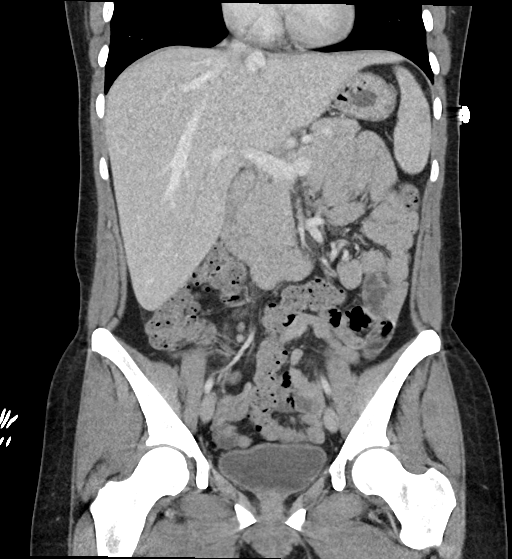
[im 40/72  soft-tissue]
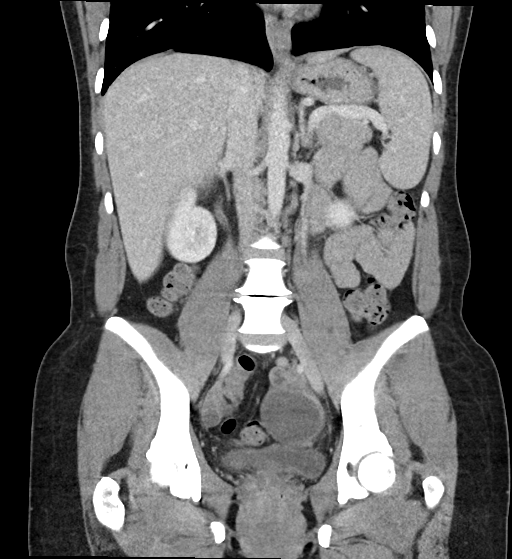

[16 of 46 positions shown; findings below may reference images not displayed]

RADIATION DOSE REDUCTION: This exam was performed according to the
departmental dose-optimization program which includes automated
exposure control, adjustment of the mA and/or kV according to
patient size and/or use of iterative reconstruction technique.

CONTRAST:  80mL OMNIPAQUE IOHEXOL 300 MG/ML  SOLN
FINDINGS: Lower chest: Unremarkable.

Hepatobiliary: No suspicious cystic or solid hepatic lesions. No
intra or extrahepatic biliary ductal dilatation. Gallbladder is
normal in appearance.

Pancreas: No pancreatic mass. No pancreatic ductal dilatation. No
pancreatic or peripancreatic fluid collections or inflammatory
changes.

Spleen: Unremarkable.

Adrenals/Urinary Tract: Ill-defined area of hypoperfusion in the
superior aspect of the right kidney measuring approximately 2.5 x
3.5 x 2.7 cm (axial image 21 of series 2 and coronal image 51 of
series 5), favored to represent changes related to pyelonephritis,
although underlying mass is difficult to exclude. Left kidney and
bilateral adrenal glands are normal. No hydroureteronephrosis.
Urinary bladder is normal in appearance.

Stomach/Bowel: The appearance of the stomach is normal. There is no
pathologic dilatation of small bowel or colon. Normal appendix.

Vascular/Lymphatic: Aortic atherosclerosis, without evidence of
aneurysm or dissection in the abdominal or pelvic vasculature. No
lymphadenopathy noted in the abdomen or pelvis.

Reproductive: Status post hysterectomy. Right ovary is unremarkable
in appearance. In the left adnexa there is a well-defined 5.5 x
x 4.9 cm low-attenuation lesion (axial image 66 of series 2 and
coronal image 43 of series 5), compatible with an ovarian cyst.
Small follicles noted in the superior aspect of the left ovary.

Other: No significant volume of ascites.  No pneumoperitoneum.

Musculoskeletal: There are no aggressive appearing lytic or blastic
lesions noted in the visualized portions of the skeleton.
IMPRESSION: 1. Ill-defined area of hypoperfusion in the upper pole of the right
kidney, likely sequela of pyelonephritis. Correlation with
urinalysis is recommended. If there is no evidence to suggest
pyelonephritis clinically or by urinalysis, follow-up abdominal MRI
with and without IV gadolinium would be recommended in the near
future to better evaluate this finding.
2. Left ovarian cyst measuring 5.5 x 4.5 x 4.9 cm.
3. Normal appendix.
4. No gallstones or findings to suggest acute cholecystitis.

## 2021-06-16 MED ORDER — SODIUM CHLORIDE 0.9 % IV SOLN: 1.0000 g | Freq: Once | INTRAVENOUS | Status: DC

## 2021-06-16 MED ORDER — MORPHINE SULFATE (PF) 2 MG/ML IV SOLN
2.0000 mg | INTRAVENOUS | Status: DC | PRN
Start: 2021-06-16 — End: 2021-06-16

## 2021-06-16 MED ORDER — ONDANSETRON HCL 4 MG/2ML IJ SOLN
4.0000 mg | Freq: Three times a day (TID) | INTRAMUSCULAR | Status: DC | PRN
  Administered 2021-06-16 – 2021-06-17 (×2): 4 mg via INTRAVENOUS
  Filled 2021-06-16 (×3): qty 2

## 2021-06-16 MED ORDER — LACTATED RINGERS IV BOLUS
1000.0000 mL | Freq: Once | INTRAVENOUS | Status: AC
Start: 2021-06-16 — End: 2021-06-16
  Administered 2021-06-16: 1000 mL via INTRAVENOUS

## 2021-06-16 MED ORDER — LACTATED RINGERS IV BOLUS
1000.0000 mL | Freq: Once | INTRAVENOUS | Status: AC
  Administered 2021-06-16: 1000 mL via INTRAVENOUS

## 2021-06-16 MED ORDER — NICOTINE 21 MG/24HR TD PT24
21.0000 mg | MEDICATED_PATCH | Freq: Every day | TRANSDERMAL | Status: DC
Start: 2021-06-16 — End: 2021-06-18
  Administered 2021-06-16 – 2021-06-18 (×3): 21 mg via TRANSDERMAL
  Filled 2021-06-16 (×4): qty 1

## 2021-06-16 MED ORDER — MORPHINE SULFATE (PF) 4 MG/ML IV SOLN
4.0000 mg | Freq: Once | INTRAVENOUS | Status: AC
Start: 2021-06-16 — End: 2021-06-16
  Administered 2021-06-16: 4 mg via INTRAVENOUS
  Filled 2021-06-16: qty 1

## 2021-06-16 MED ORDER — SUMATRIPTAN SUCCINATE 50 MG PO TABS
50.0000 mg | ORAL_TABLET | ORAL | Status: DC | PRN
  Administered 2021-06-16: 50 mg via ORAL
  Filled 2021-06-16 (×2): qty 1

## 2021-06-16 MED ORDER — SODIUM CHLORIDE 0.9 % IV SOLN
1.0000 g | Freq: Every day | INTRAVENOUS | Status: DC
  Administered 2021-06-17 – 2021-06-18 (×2): 1 g via INTRAVENOUS
  Filled 2021-06-16 (×2): qty 1

## 2021-06-16 MED ORDER — LACTATED RINGERS IV SOLN: INTRAVENOUS | Status: DC

## 2021-06-16 MED ORDER — OXYCODONE-ACETAMINOPHEN 5-325 MG PO TABS
1.0000 | ORAL_TABLET | ORAL | Status: DC | PRN
  Administered 2021-06-17: 1 via ORAL
  Filled 2021-06-16: qty 1

## 2021-06-16 MED ORDER — IOHEXOL 300 MG/ML  SOLN
80.0000 mL | Freq: Once | INTRAMUSCULAR | Status: AC | PRN
  Administered 2021-06-16: 80 mL via INTRAVENOUS

## 2021-06-16 MED ORDER — ENOXAPARIN SODIUM 40 MG/0.4ML IJ SOSY
40.0000 mg | PREFILLED_SYRINGE | Freq: Every day | INTRAMUSCULAR | Status: DC
  Administered 2021-06-16 – 2021-06-17 (×2): 40 mg via SUBCUTANEOUS
  Filled 2021-06-16 (×2): qty 0.4

## 2021-06-16 MED ORDER — SODIUM CHLORIDE 0.9 % IV SOLN
1.0000 g | Freq: Once | INTRAVENOUS | Status: AC
  Administered 2021-06-16: 1 g via INTRAVENOUS
  Filled 2021-06-16: qty 10

## 2021-06-16 MED ORDER — ACETAMINOPHEN 500 MG PO TABS
1000.0000 mg | ORAL_TABLET | Freq: Once | ORAL | Status: AC
  Administered 2021-06-16: 1000 mg via ORAL
  Filled 2021-06-16: qty 2

## 2021-06-16 MED ORDER — ONDANSETRON HCL 4 MG/2ML IJ SOLN
4.0000 mg | Freq: Once | INTRAMUSCULAR | Status: AC
  Administered 2021-06-16: 4 mg via INTRAVENOUS
  Filled 2021-06-16: qty 2

## 2021-06-16 MED ORDER — HYDROMORPHONE HCL 1 MG/ML IJ SOLN
1.0000 mg | INTRAMUSCULAR | Status: DC | PRN
  Administered 2021-06-16 – 2021-06-17 (×4): 1 mg via INTRAVENOUS
  Filled 2021-06-16 (×4): qty 1

## 2021-06-16 MED ORDER — KETOROLAC TROMETHAMINE 30 MG/ML IJ SOLN
15.0000 mg | Freq: Once | INTRAMUSCULAR | Status: AC
  Administered 2021-06-16: 15 mg via INTRAVENOUS
  Filled 2021-06-16: qty 1

## 2021-06-16 MED ORDER — ACETAMINOPHEN 325 MG PO TABS
650.0000 mg | ORAL_TABLET | Freq: Four times a day (QID) | ORAL | Status: DC | PRN
Start: 2021-06-16 — End: 2021-06-18
  Administered 2021-06-16 – 2021-06-17 (×4): 650 mg via ORAL
  Filled 2021-06-16 (×4): qty 2

## 2021-06-16 NOTE — H&P (Signed)
, ?History and Physical  ? ? ?ANJOLI DIEMER IFO:[id number] DOB: 09-29-1980 DOA: 06/16/2021 ? ?Referring MD/NP/PA:  ? ?PCP: System, Provider Not In  ? ?Patient coming from:  The patient is coming from home.  At baseline, pt is independent for most of ADL.       ? ?Chief Complaint: Nausea, vomiting, abdominal pain, subjective fever  ? ? ? ?HPI: Gloria [name] is a 41 y.o. female with medical history significant of migraine headache, tobacco use, anemia, pregnancy-induced hypertension, who presents with nausea, vomiting, abdominal pain, subjective fever. ? ?Patient states she has been sick for several days, which has been progressively worsening.  She has nausea, nonbilious nonbloody vomiting several times each day, and abdominal pain.  No diarrhea.  Her abdominal pain is located in right side of abdomen, constant, sharp, severe, 10 out of 10 in severity, radiating to the right flank area, aggravated by moving her legs.  Patient denies hematuria, dysuria or burning with urination.  No urgency or urinary frequency.  Patient does not have chest pain, cough, shortness of breath.  Patient reports headache. ? ?Data Reviewed and ED Course: pt was found to have WBC 19.4, negative pregnancy test, lipase of 25, positive urinalysis (cloudy appearance, large amount of leukocyte, positive nitrite, many bacteria, WBC> 50), GFR> 60, blood pressure 145/93, heart rate of 113, RR 19, oxygen saturation 96% on room air.  CT abdomen/pelvis findings are consistent with pyelonephritis.  Patient is admitted to MedSurg bed as inpatient. ? ?CT-abd/pelvis ?1. Ill-defined area of hypoperfusion in the upper pole of the right ?kidney, likely sequela of pyelonephritis. Correlation with ?urinalysis is recommended. If there is no evidence to suggest ?pyelonephritis clinically or by urinalysis, follow-up abdominal MRI ?with and without IV gadolinium would be recommended in the near ?future to better evaluate this finding. ?2. Left ovarian cyst  measuring 5.5 x 4.5 x 4.9 cm. ?3. Normal appendix. ?4. No gallstones or findings to suggest acute cholecystitis. ? ? ?EKG: I have personally reviewed.  Sinus rhythm, QTc 385, LAE, nonspecific T wave change ? ? ?Review of Systems:  ? ?General: Subjective fevers, chills, no body weight gain, has poor appetite, has fatigue ?HEENT: no blurry vision, hearing changes or sore throat ?Respiratory: no dyspnea, coughing, wheezing ?CV: no chest pain, no palpitations ?GI: Has nausea, vomiting, abdominal pain, no diarrhea, constipation ?GU: no dysuria, burning on urination, increased urinary frequency, hematuria  ?Ext: no leg edema ?Neuro: no unilateral weakness, numbness, or tingling, no vision change or hearing loss ?Skin: no rash, no skin tear. ?MSK: No muscle spasm, no deformity, no limitation of range of movement in spin ?Heme: No easy bruising.  ?Travel history: No recent long distant travel. ? ? ?Allergy: No Known Allergies ? ?Past Medical History:  ?Diagnosis Date  ? Acute pyelonephritis 06/16/2021  ? Pregnancy induced hypertension   ? Preterm labor   ? Vaginal Pap smear, abnormal   ? ? ?Past Surgical History:  ?Procedure Laterality Date  ? TUBAL LIGATION Bilateral 12/30/2018  ? Procedure: POST PARTUM TUBAL LIGATION;  Surgeon: Ward, Elenora Fender, MD;  Location: [facility] ORS;  Service: Gynecology;  Laterality: Bilateral;  ? VAGINAL HYSTERECTOMY N/A 03/04/2021  ? Procedure: HYSTERECTOMY VAGINAL;  Surgeon: [name], MD;  Location: [facility];  Service: Gynecology;  Laterality: N/A;  ? ? ?Social History:  reports that she has been smoking cigarettes. She has been smoking an average of 1 pack per day. She has never used smokeless tobacco. She reports current alcohol use of about  2.0 standard drinks per week. She reports that she does not use drugs. ? ?Family History:  ?Family History  ?Problem Relation Age of Onset  ? Diabetes Mother   ? Arthritis Father   ? Cancer Maternal Grandfather   ? Diabetes Paternal Grandmother   ?  Arthritis Paternal Grandmother   ?  ? ?Prior to Admission medications   ?Medication Sig Start Date End Date Taking? Authorizing Provider  ?clobetasol ointment (TEMOVATE) 0.05 % Apply 1 application topically daily as needed for irritation. 01/30/21   [provider]  ?docusate sodium (COLACE) 100 MG capsule Take 1 capsule (100 mg total) by mouth 2 (two) times daily. To keep stools soft 03/04/21   Christeen DouglasBeasley, Bethany, MD  ?ferrous sulfate 325 (65 FE) MG tablet Take 1 tablet (325 mg total) by mouth daily with breakfast. Take with Vitamin C ?Patient not taking: Reported on 02/13/2021 12/31/18 03/01/19  Christeen DouglasBeasley, Bethany, MD  ?gabapentin (NEURONTIN) 800 MG tablet Take 1 tablet (800 mg total) by mouth at bedtime for 14 days. Take nightly for 3 days, then up to 14 days as needed 03/04/21 03/18/21  Christeen DouglasBeasley, Bethany, MD  ?ondansetron (ZOFRAN-ODT) 4 MG disintegrating tablet Take 1 tablet (4 mg total) by mouth every 8 (eight) hours as needed for nausea or vomiting. 03/04/21   Christeen DouglasBeasley, Bethany, MD  ?oxyCODONE (OXY IR/ROXICODONE) 5 MG immediate release tablet Take 1 tablet (5 mg total) by mouth every 4 (four) hours as needed for severe pain. 03/04/21   Christeen DouglasBeasley, Bethany, MD  ? ? ?Physical Exam: ?Vitals:  ? 06/16/21 0955 06/16/21 0955  ?BP:  (!) 145/93  ?Pulse:  (!) 113  ?Resp:  19  ?Temp:  99 ?F (37.2 ?C)  ?TempSrc:  Oral  ?SpO2:  96%  ?Weight: 64.4 kg   ?Height: 5\' 3"  (1.6 m)   ? ?General: Not in acute distress ?HEENT: ?      Eyes: PERRL, EOMI, no scleral icterus. ?      ENT: No discharge from the ears and nose, no pharynx injection, no tonsillar enlargement.  ?      Neck: No JVD, no bruit, no mass felt. ?Heme: No neck lymph node enlargement. ?Cardiac: S1/S2, RRR, No murmurs, No gallops or rubs. ?Respiratory: No rales, wheezing, rhonchi or rubs. ?GI: Soft, nondistended, no rebound pain, no organomegaly, BS present.  Has tenderness over right side of abdomen ?GU: Positive right CVA tenderness ?Ext: No pitting leg edema  bilaterally. 1+DP/PT pulse bilaterally. ?Musculoskeletal: No joint deformities, No joint redness or warmth, no limitation of ROM in spin. ?Skin: No rashes.  ?Neuro: Alert, oriented X3, cranial nerves II-XII grossly intact, moves all extremities normally.  ?Psych: Patient is not psychotic, no suicidal or hemocidal ideation. ? ?Labs on Admission: I have personally reviewed following labs and imaging studies ? ?CBC: ?Recent Labs  ?Lab 06/16/21 ?1010  ?WBC 19.4*  ?HGB 14.5  ?HCT 42.8  ?MCV 90.5  ?PLT 246  ? ?Basic Metabolic Panel: ?Recent Labs  ?Lab 06/16/21 ?1010  ?NA 133*  ?K 3.7  ?CL 100  ?CO2 23  ?GLUCOSE 124*  ?BUN 10  ?CREATININE 0.71  ?CALCIUM 8.9  ? ?GFR: ?Estimated Creatinine Clearance: 85.3 mL/min (by C-G formula based on SCr of 0.71 mg/dL). ?Liver Function Tests: ?Recent Labs  ?Lab 06/16/21 ?1010  ?AST 19  ?ALT 32  ?ALKPHOS 70  ?BILITOT 1.1  ?PROT 7.5  ?ALBUMIN 3.9  ? ?Recent Labs  ?Lab 06/16/21 ?1010  ?LIPASE 25  ? ?No results for input(s): AMMONIA in the last  168 hours. ?Coagulation Profile: ?No results for input(s): INR, PROTIME in the last 168 hours. ?Cardiac Enzymes: ?No results for input(s): CKTOTAL, CKMB, CKMBINDEX, TROPONINI in the last 168 hours. ?BNP (last 3 results) ?No results for input(s): PROBNP in the last 8760 hours. ?HbA1C: ?No results for input(s): HGBA1C in the last 72 hours. ?CBG: ?No results for input(s): GLUCAP in the last 168 hours. ?Lipid Profile: ?No results for input(s): CHOL, HDL, LDLCALC, TRIG, CHOLHDL, LDLDIRECT in the last 72 hours. ?Thyroid Function Tests: ?No results for input(s): TSH, T4TOTAL, FREET4, T3FREE, THYROIDAB in the last 72 hours. ?Anemia Panel: ?No results for input(s): VITAMINB12, FOLATE, FERRITIN, TIBC, IRON, RETICCTPCT in the last 72 hours. ?Urine analysis: ?   ?Component Value Date/Time  ? COLORURINE YELLOW (A) 06/16/2021 1010  ? APPEARANCEUR CLOUDY (A) 06/16/2021 1010  ? LABSPEC 1.016 06/16/2021 1010  ? PHURINE 5.0 06/16/2021 1010  ? GLUCOSEU NEGATIVE  06/16/2021 1010  ? HGBUR MODERATE (A) 06/16/2021 1010  ? BILIRUBINUR NEGATIVE 06/16/2021 1010  ? KETONESUR NEGATIVE 06/16/2021 1010  ? PROTEINUR 100 (A) 06/16/2021 1010  ? NITRITE POSITIVE (A) 06/16/2021 1010  ? LEUKOCYTESUR LARGE

## 2021-06-16 NOTE — Sepsis Progress Note (Signed)
Sepsis protocol is being followed by [facility]. 

## 2021-06-16 NOTE — ED Triage Notes (Signed)
Pt in with co vomiting since Friday, has had chills and right sided abd pain. Pain increases when she breathes or walks.  ?

## 2021-06-16 NOTE — ED Provider Notes (Signed)
? ?[facility] ?Provider Note ? ? ? Event Date/Time  ? First MD Initiated Contact with Patient 06/16/21 1005   ?  (approximate) ? ? ?History  ? ?Abdominal Pain ? ? ?HPI ? ?Efrain Sellamily R Juniel is a 41 y.o. female who presents to the ED for evaluation of Abdominal Pain ?  ?I reviewed PCP visit from 3/15.  History of migraines and anemia.  G6 .  Intra-abdominal surgical history of hysterectomy, tubal ligation.  ? ?Patient presents to the ED, accompanied by her husband, for evaluation of right-sided abdominal pain, emesis and subjective fevers over the past couple days.  She reports chills and sweats at night, persistent right-sided abdominal pain and multiple episodes of nonbloody nonbilious emesis.  Denies any stool changes, new vaginal discharge or bleeding, denies any urinary changes such as dysuria, hematuria, frequency or foul-smelling urine. ? ?Reports that she "never has symptoms of UTI."  Denies any recent antibiotics. ? ?Physical Exam  ? ?Triage Vital Signs: ?ED Triage Vitals  ?Enc Vitals Group  ?   BP 06/16/21 0955 (!) 145/93  ?   Pulse Rate 06/16/21 0955 (!) 113  ?   Resp 06/16/21 0955 19  ?   Temp 06/16/21 0955 99 ?F (37.2 ?C)  ?   Temp Source 06/16/21 0955 Oral  ?   SpO2 06/16/21 0955 96 %  ?   Weight 06/16/21 0955 142 lb (64.4 kg)  ?   Height 06/16/21 0955 5\' 3"  (1.6 m)  ?   Head Circumference --   ?   Peak Flow --   ?   Pain Score 06/16/21 0955 10  ?   Pain Loc --   ?   Pain Edu? --   ?   Excl. in GC? --   ? ? ?Most recent vital signs: ?Vitals:  ? 06/16/21 0955  ?BP: (!) 145/93  ?Pulse: (!) 113  ?Resp: 19  ?Temp: 99 ?F (37.2 ?C)  ?SpO2: 96%  ? ? ?General: Awake,.  Uncomfortable without distress.  Warm to the touch. ?CV:  Good peripheral perfusion. Tachycardic and regular.  ?Resp:  Normal effort.  ?Abd:  No distention.  Suprapubic tenderness, RLQ tenderness and epigastric tenderness without peritoneal features. ?MSK:  No deformity noted.  ?Neuro:  No focal deficits  appreciated. ?Other:   ? ? ?ED Results / Procedures / Treatments  ? ?Labs ?(all labs ordered are listed, but only abnormal results are displayed) ?Labs Reviewed  ?COMPREHENSIVE METABOLIC PANEL - Abnormal; Notable for the following components:  ?    Result Value  ? Sodium 133 (*)   ? Glucose, Bld 124 (*)   ? All other components within normal limits  ?CBC - Abnormal; Notable for the following components:  ? WBC 19.4 (*)   ? All other components within normal limits  ?URINALYSIS, ROUTINE W REFLEX MICROSCOPIC - Abnormal; Notable for the following components:  ? Color, Urine YELLOW (*)   ? APPearance CLOUDY (*)   ? Hgb urine dipstick MODERATE (*)   ? Protein, ur 100 (*)   ? Nitrite POSITIVE (*)   ? Leukocytes,Ua LARGE (*)   ? WBC, UA >50 (*)   ? Bacteria, UA MANY (*)   ? All other components within normal limits  ?URINE CULTURE  ?CULTURE, BLOOD (SINGLE)  ?LIPASE, BLOOD  ?LACTIC ACID, PLASMA  ?LACTIC ACID, PLASMA  ?PROCALCITONIN  ?POC URINE PREG, ED  ? ? ?EKG ?Sinus tachycardia with a rate of 101 bpm.  Normal axis and intervals.  Nonspecific ST changes inferiorly and laterally without STEMI. ? ?RADIOLOGY ?CT abdomen/pelvis reviewed by me with right perinephric stranding without evidence of appendicitis or cholecystitis ? ?Official radiology report(s): ?CT ABDOMEN PELVIS W CONTRAST ? ?Result Date: 06/16/2021 ?CLINICAL DATA:  41 year old female with history of right-sided abdominal pain and emesis. Some findings of both cholecystitis and appendicitis on physical examination. EXAM: CT ABDOMEN AND PELVIS WITH CONTRAST TECHNIQUE: Multidetector CT imaging of the abdomen and pelvis was performed using the standard protocol following bolus administration of intravenous contrast. RADIATION DOSE REDUCTION: This exam was performed according to the departmental dose-optimization program which includes automated exposure control, adjustment of the mA and/or kV according to patient size and/or use of iterative reconstruction technique.  CONTRAST:  53mL OMNIPAQUE IOHEXOL 300 MG/ML  SOLN COMPARISON:  No priors. FINDINGS: Lower chest: Unremarkable. Hepatobiliary: No suspicious cystic or solid hepatic lesions. No intra or extrahepatic biliary ductal dilatation. Gallbladder is normal in appearance. Pancreas: No pancreatic mass. No pancreatic ductal dilatation. No pancreatic or peripancreatic fluid collections or inflammatory changes. Spleen: Unremarkable. Adrenals/Urinary Tract: Ill-defined area of hypoperfusion in the superior aspect of the right kidney measuring approximately 2.5 x 3.5 x 2.7 cm (axial image 21 of series 2 and coronal image 51 of series 5), favored to represent changes related to pyelonephritis, although underlying mass is difficult to exclude. Left kidney and bilateral adrenal glands are normal. No hydroureteronephrosis. Urinary bladder is normal in appearance. Stomach/Bowel: The appearance of the stomach is normal. There is no pathologic dilatation of small bowel or colon. Normal appendix. Vascular/Lymphatic: Aortic atherosclerosis, without evidence of aneurysm or dissection in the abdominal or pelvic vasculature. No lymphadenopathy noted in the abdomen or pelvis. Reproductive: Status post hysterectomy. Right ovary is unremarkable in appearance. In the left adnexa there is a well-defined 5.5 x 4.5 x 4.9 cm low-attenuation lesion (axial image 66 of series 2 and coronal image 43 of series 5), compatible with an ovarian cyst. Small follicles noted in the superior aspect of the left ovary. Other: No significant volume of ascites.  No pneumoperitoneum. Musculoskeletal: There are no aggressive appearing lytic or blastic lesions noted in the visualized portions of the skeleton. IMPRESSION: 1. Ill-defined area of hypoperfusion in the upper pole of the right kidney, likely sequela of pyelonephritis. Correlation with urinalysis is recommended. If there is no evidence to suggest pyelonephritis clinically or by urinalysis, follow-up abdominal  MRI with and without IV gadolinium would be recommended in the near future to better evaluate this finding. 2. Left ovarian cyst measuring 5.5 x 4.5 x 4.9 cm. 3. Normal appendix. 4. No gallstones or findings to suggest acute cholecystitis. Electronically Signed   By: Trudie Reed M.D.   On: 06/16/2021 12:11   ? ?PROCEDURES and INTERVENTIONS: ? ?.1-3 Lead EKG Interpretation ?Performed by: Delton Prairie, MD ?Authorized by: Delton Prairie, MD  ? ?  Interpretation: abnormal   ?  ECG rate:  110 ?  ECG rate assessment: tachycardic   ?  Rhythm: sinus tachycardia   ?  Ectopy: none   ?  Conduction: normal   ?.Critical Care ?Performed by: Delton Prairie, MD ?Authorized by: Delton Prairie, MD  ? ?Critical care provider statement:  ?  Critical care time (minutes):  30 ?  Critical care time was exclusive of:  Separately billable procedures and treating other patients ?  Critical care was necessary to treat or prevent imminent or life-threatening deterioration of the following conditions:  Sepsis ?  Critical care was time spent personally by me on the  following activities:  Development of treatment plan with patient or surrogate, discussions with consultants, evaluation of patient's response to treatment, examination of patient, ordering and review of laboratory studies, ordering and review of radiographic studies, ordering and performing treatments and interventions, pulse oximetry, re-evaluation of patient's condition and review of old charts ? ?Medications  ?ketorolac (TORADOL) 30 MG/ML injection 15 mg (has no administration in time range)  ?acetaminophen (TYLENOL) tablet 1,000 mg (has no administration in time range)  ?lactated ringers bolus 1,000 mL (has no administration in time range)  ?lactated ringers infusion (has no administration in time range)  ?lactated ringers bolus 1,000 mL (0 mLs Intravenous Stopped 06/16/21 1122)  ?ondansetron (ZOFRAN) injection 4 mg (4 mg Intravenous Given 06/16/21 1044)  ?morphine (PF) 4 MG/ML  injection 4 mg (4 mg Intravenous Given 06/16/21 1044)  ?cefTRIAXone (ROCEPHIN) 1 g in sodium chloride 0.9 % 100 mL IVPB (0 g Intravenous Stopped 06/16/21 1122)  ?iohexol (OMNIPAQUE) 300 MG/ML solution 80 mL (80 mLs Intravenous Con

## 2021-06-17 ENCOUNTER — Inpatient Hospital Stay: Payer: Medicaid Other

## 2021-06-17 LAB — CBC
HCT: 36.3 % (ref 36.0–46.0)
Hemoglobin: 12.2 g/dL (ref 12.0–15.0)
MCH: 30.9 pg (ref 26.0–34.0)
MCHC: 33.6 g/dL (ref 30.0–36.0)
MCV: 91.9 fL (ref 80.0–100.0)
Platelets: 184 10*3/uL (ref 150–400)
RBC: 3.95 MIL/uL (ref 3.87–5.11)
RDW: 12.4 % (ref 11.5–15.5)
WBC: 15 10*3/uL — ABNORMAL HIGH (ref 4.0–10.5)
nRBC: 0 % (ref 0.0–0.2)

## 2021-06-17 IMAGING — MR MR CERVICAL SPINE W/O CM
5 series · 38 of 48 positions shown · non-contrast
Comparison: None.

CLINICAL DATA: Cervical radiculopathy, no red flags

EXAM:
MRI CERVICAL SPINE WITHOUT CONTRAST
TECHNIQUE: Multiplanar, multisequence MR imaging of the cervical spine was
performed. No intravenous contrast was administered.

[Series 5: T2 · sagittal · 3.0mm · 0.62mm/px · 6 of 15 slices shown (1 of 2)]
[im 1/15]
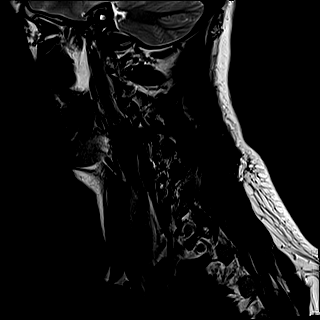
[im 3/15]
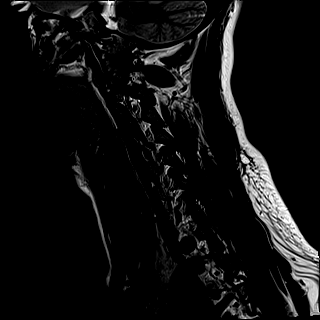
[im 6/15]
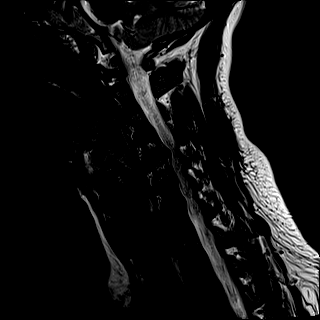
[im 9/15]
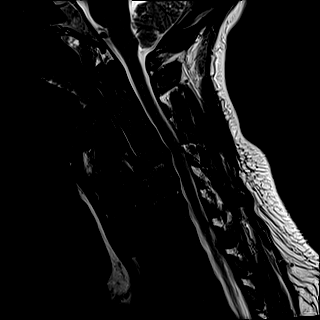
[im 12/15]
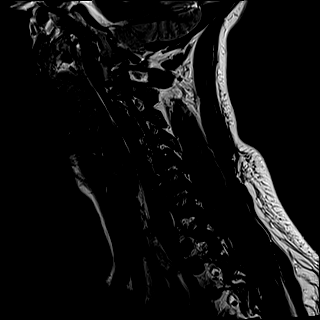
[im 15/15]
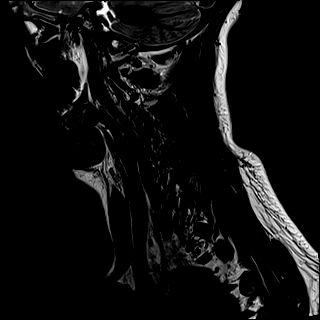

[Series 6: FLAIR · sagittal · 3.0mm · 0.78mm/px · 7 of 15 slices shown]
[im 1/15]
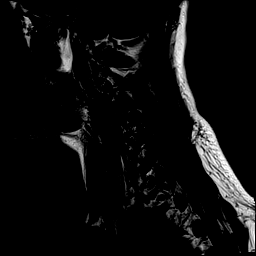
[im 3/15]
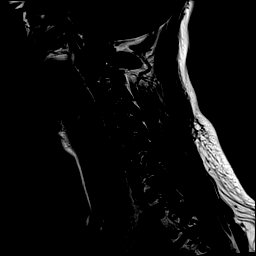
[im 5/15]
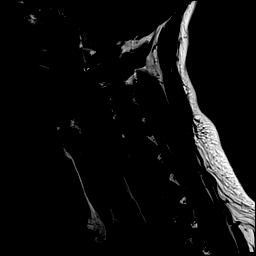
[im 8/15]
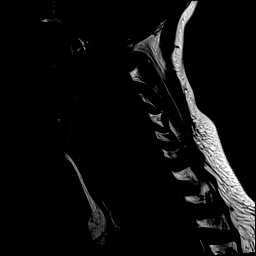
[im 10/15]
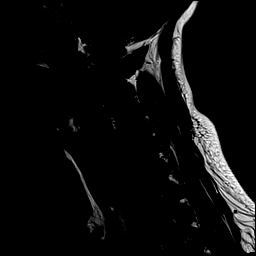
[im 12/15]
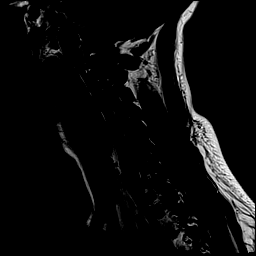
[im 15/15]
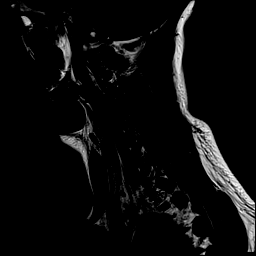

[Series 7: STIR · sagittal · 3.0mm · 0.62mm/px · 7 of 15 slices shown]
[im 1/15]
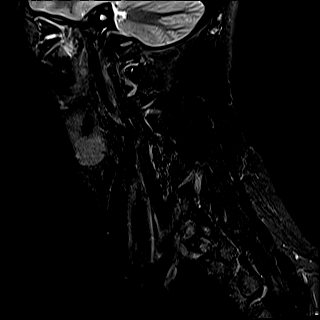
[im 3/15]
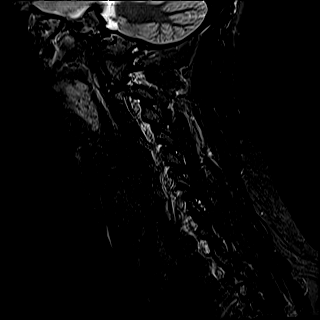
[im 5/15]
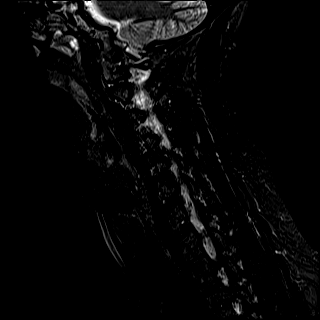
[im 8/15]
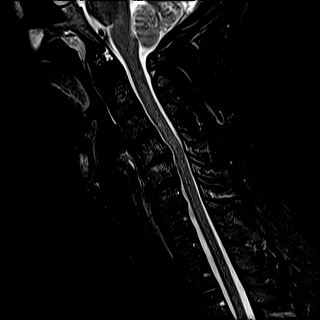
[im 10/15]
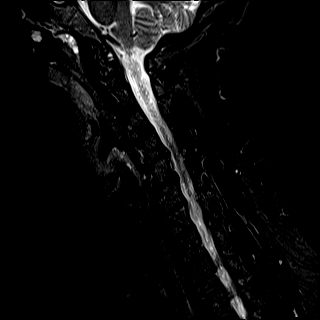
[im 12/15]
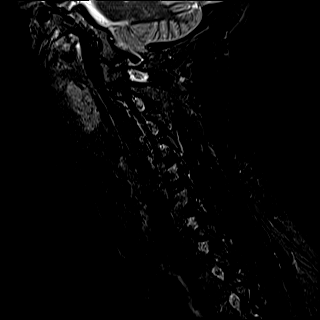
[im 15/15]
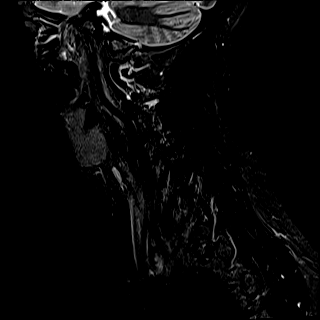

[Series 8: T2 · axial · 3.0mm · 0.70mm/px · z∈[-105,-11]mm · 10 of 29 slices shown (2 of 2)]
[im 1/29]
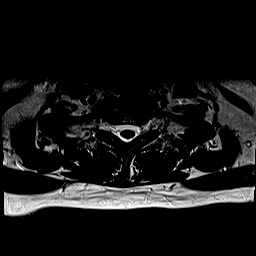
[im 3/29]
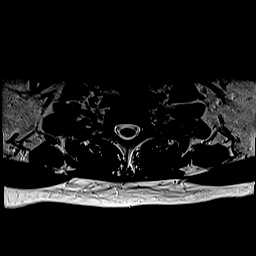
[im 5/29]
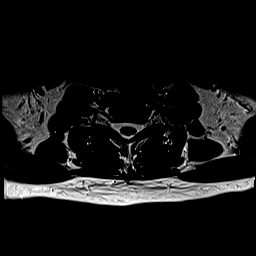
[im 7/29]
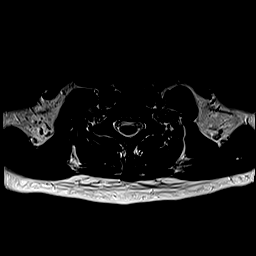
[im 9/29]
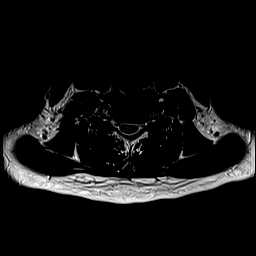
[im 13/29]
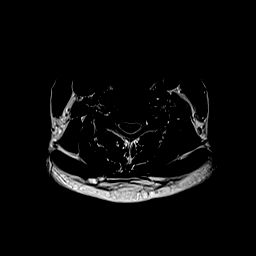
[im 16/29]
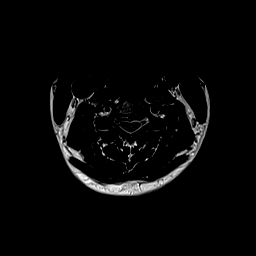
[im 20/29]
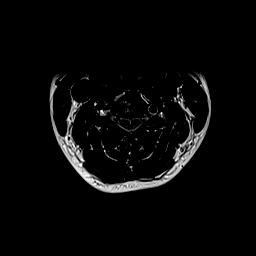
[im 24/29]
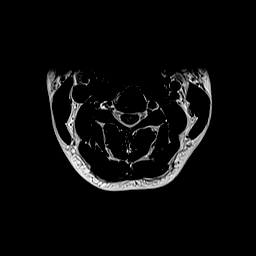
[im 29/29]
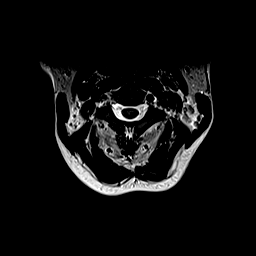

[Series 9: ax mpgr · axial · 3.0mm · 0.35mm/px · z∈[-105,-11]mm · 8 of 29 slices shown]
[im 1/29]
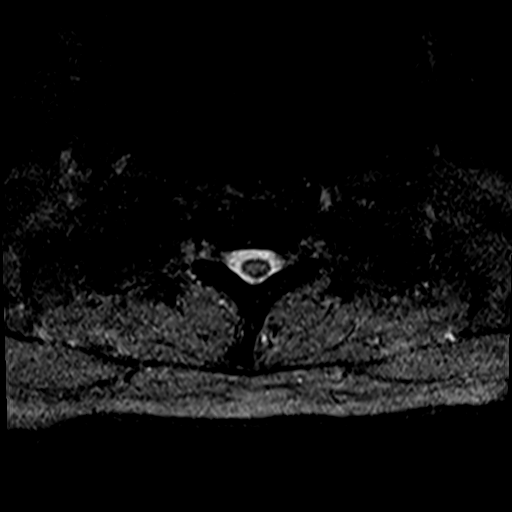
[im 5/29]
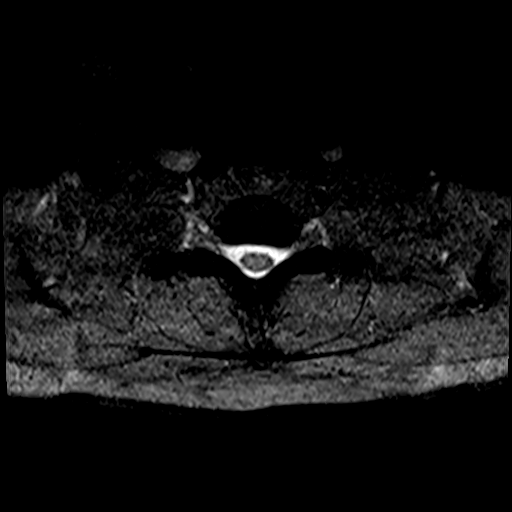
[im 9/29]
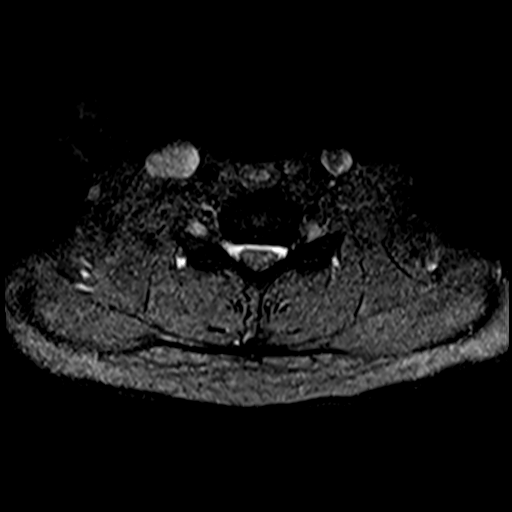
[im 13/29]
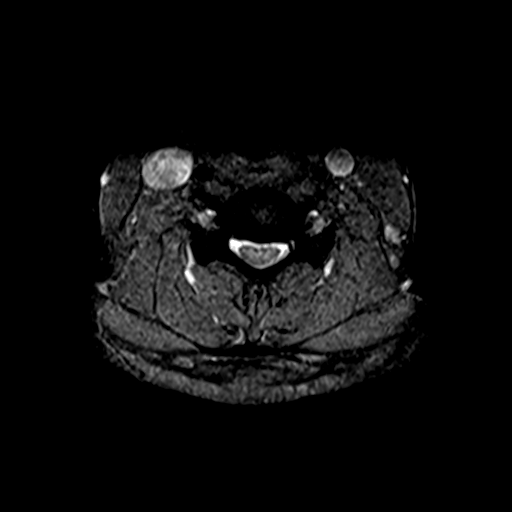
[im 16/29]
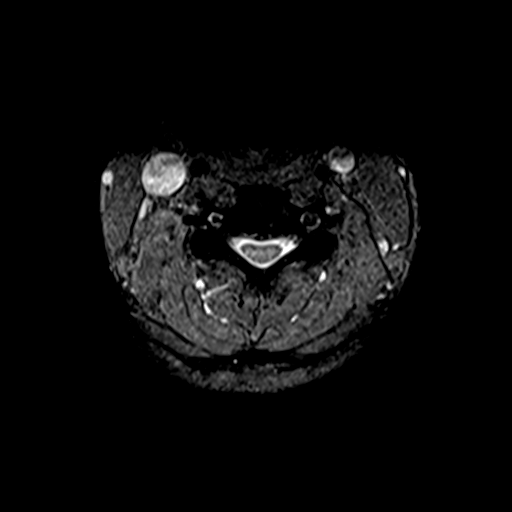
[im 20/29]
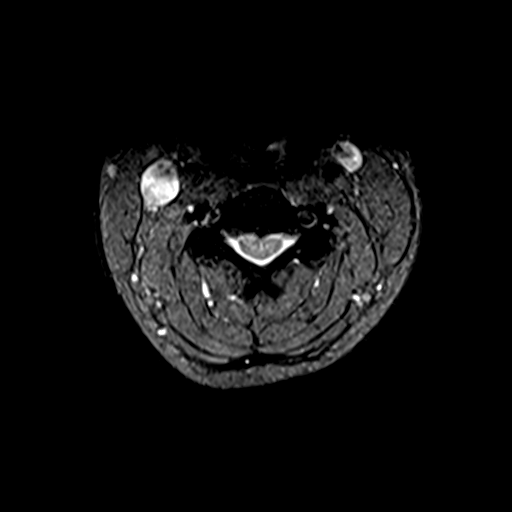
[im 24/29]
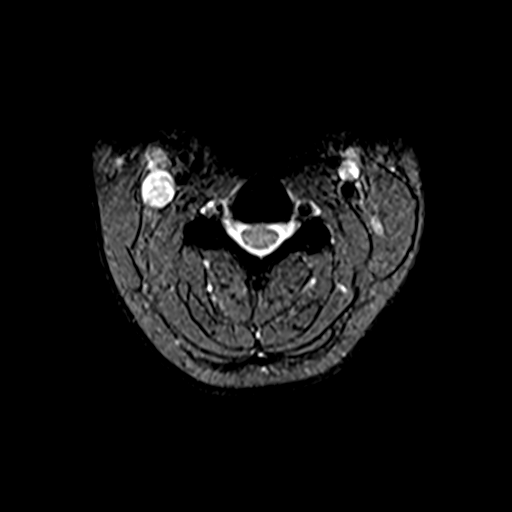
[im 29/29]
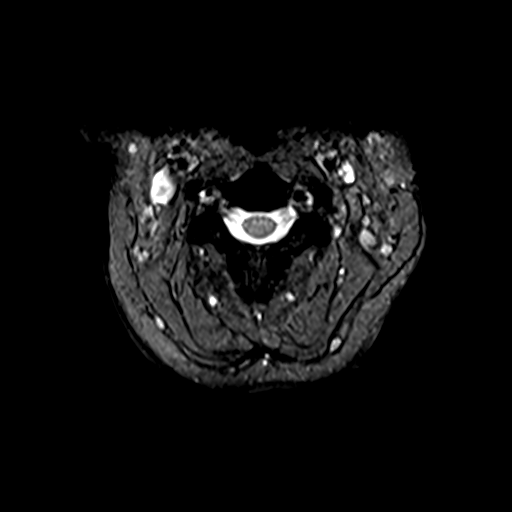

[38 of 48 positions shown; findings below may reference images not displayed]

FINDINGS: Alignment: Reversal of the normal cervical lordosis. Slight
anterolisthesis of C3 on C4.

Vertebrae: Degenerative/discogenic endplate signal changes about the
right eccentric C4-C5 disc. Otherwise, no focal marrow edema chest
acute fracture discitis/osteomyelitis. No suspicious bone lesions.

Cord: Normal cord signal.

Posterior Fossa, vertebral arteries, paraspinal tissues: Visualized
vertebral artery flow voids are maintained. No evidence of acute
abnormality in the visualized posterior fossa. No paraspinal edema.

Disc levels:

C2-C3: No significant disc protrusion, foraminal stenosis, or canal
stenosis.

C3-C4: Small posterior disc osteophyte complex without significant
canal or foraminal stenosis.

C4-C5: Posterior disc osteophyte complex with right greater than
left facet and uncovertebral hypertrophy. Resulting moderate to
severe right and moderate left foraminal stenosis. Moderate canal
stenosis with deformity of the cord.

C5-C6: Posterior disc osteophyte complex with right greater than
left facet and uncovertebral hypertrophy. Resulting moderate right
greater than left foraminal stenosis. Mild to moderate canal
stenosis.

C6-C7: Posterior disc osteophyte complex with left greater than
right facet and uncovertebral hypertrophy. Resulting mild left
foraminal stenosis and mild canal stenosis.

C7-T1: No significant disc protrusion, foraminal stenosis, or canal
stenosis.
IMPRESSION: 1. At C4-C5, moderate to severe right and moderate left foraminal
stenosis, moderate canal stenosis, and deformity of the cord.
2. At C5-C6, moderate right greater than left foraminal stenosis and
mild to moderate canal stenosis.
3. At C6-C7, mild left foraminal stenosis and mild canal stenosis.

## 2021-06-17 MED ORDER — TRAMADOL HCL 50 MG PO TABS: 50.0000 mg | ORAL_TABLET | Freq: Four times a day (QID) | ORAL | Status: DC | PRN

## 2021-06-17 MED ORDER — METHOCARBAMOL 1000 MG/10ML IJ SOLN
500.0000 mg | Freq: Three times a day (TID) | INTRAVENOUS | Status: DC | PRN
  Filled 2021-06-17: qty 5

## 2021-06-17 MED ORDER — PROCHLORPERAZINE EDISYLATE 10 MG/2ML IJ SOLN
10.0000 mg | Freq: Four times a day (QID) | INTRAMUSCULAR | Status: DC | PRN
Start: 2021-06-17 — End: 2021-06-18
  Administered 2021-06-17: 10 mg via INTRAVENOUS
  Filled 2021-06-17: qty 2

## 2021-06-17 MED ORDER — BUTALBITAL-APAP-CAFFEINE 50-325-40 MG PO TABS
1.0000 | ORAL_TABLET | Freq: Four times a day (QID) | ORAL | Status: DC | PRN
  Administered 2021-06-17: 1 via ORAL
  Filled 2021-06-17: qty 1

## 2021-06-17 MED ORDER — HYDROMORPHONE HCL 1 MG/ML IJ SOLN: 1.0000 mg | INTRAMUSCULAR | Status: DC | PRN

## 2021-06-17 MED ORDER — ONDANSETRON HCL 4 MG/2ML IJ SOLN
4.0000 mg | Freq: Four times a day (QID) | INTRAMUSCULAR | Status: DC | PRN
Start: 2021-06-17 — End: 2021-06-18

## 2021-06-17 MED ORDER — OXYCODONE HCL 5 MG PO TABS: 5.0000 mg | ORAL_TABLET | ORAL | Status: DC | PRN

## 2021-06-17 NOTE — Progress Notes (Signed)
?  Progress Note ? ? ?Patient: Gloria [name] GYB:[id number] DOB: 09/21/1980 DOA: 06/16/2021     1 ?DOS: the patient was seen and examined on 06/17/2021 ?  ?Brief hospital course: ?Gloria [name] is a 41 y.o. female with medical history significant of migraine headache, tobacco use, anemia, pregnancy-induced hypertension, who presented to the ED on 06/16/2021 with progressively worsening nausea, vomiting, abdominal pain, subjective fever. ? ?Evaluation in the ED is consistent with acute right-sided pyelonephritis.  Patient did criteria for sepsis with leukocytosis, tachycardia.  Started on empiric Rocephin  pending cultures , IV fluids and admitted to the hospital. ? ?Assessment and Plan: ?* Acute pyelonephritis ?Patient has ongoing fever chills, nausea vomiting and flank pain. ?-- Continue empiric Rocephin ?--Follow-up blood and urine cultures ?-- Antiemetics and pain control as needed ?-- Continue IV fluids ? ?Sepsis (HCC) ?Present on admission with leukocytosis, tachycardia in the setting of acute pyelonephritis.  Lactic acid was normal and no other evidence of organ dysfunction. ?-- Continue antibiotics as outlined ?-- Follow cultures ?-- Monitor fever curve and CBC, hemodynamics ? ?Migraine ?Continue Imitrex, as needed Tylenol ? ?Tobacco use ?Counseled regarding importance of cessation.  Nicotine patch ordered. ? ? ? ? ?  ? ?Subjective: Patient was awake sitting up in bed when seen today.  She continues to have fevers/chills and nausea vomiting.  Ongoing right flank pain as well.  Currently no other acute complaints ? ? ?Physical Exam: ?Vitals:  ? 06/17/21 0346 06/17/21 0523 06/17/21 0924 06/17/21 1030  ?BP: 122/75 120/75 134/74   ?Pulse: (!) 110 95 (!) 107   ?Resp: 17 17 16    ?Temp: (!) 100.9 ?F (38.3 ?C) 99.5 ?F (37.5 ?C) (!) 100.8 ?F (38.2 ?C) 99.5 ?F (37.5 ?C)  ?TempSrc:      ?SpO2: 93% 98% 100%   ?Weight:      ?Height:      ? ?General exam: awake, alert, no acute distress ?HEENT: atraumatic, clear conjunctiva,  anicteric sclera, moist mucus membranes, hearing grossly normal  ?Respiratory system: CTAB, no wheezes, rales or rhonchi, normal respiratory effort. ?Cardiovascular system: normal S1/S2, RRR, no pedal edema.   ?Gastrointestinal system: soft, nontender, right-sided point. ?Central nervous system: A&O x3. no gross focal neurologic deficits, normal speech ?Extremities: moves all, no edema, normal tone ?Skin: dry, intact, normal temperature ?Psychiatry: normal mood, congruent affect, judgement and insight appear normal ? ? ?Data Reviewed: ? ?Labs reviewed and notable for white count 15.0, down from 19.4 .  Preliminary urine cultures growing gram-negative rods.  Blood cultures no growth to date. ? ?Family Communication: None at bedside during my encounter, will attempt to call ? ?Disposition: ?Status is: Inpatient ?Remains inpatient appropriate because: Severity of illness with ongoing fevers, on IV antibiotics pending cultures ? ? Planned Discharge Destination: Home ? ? ? ?Time spent: 35 minutes ? ?Author: ?Marland Kitchen, DO ?06/17/2021 2:25 PM ? ?For on call review [email or web address].  ?

## 2021-06-17 NOTE — Progress Notes (Signed)
?   06/17/21 0346  ?Assess: MEWS Score  ?Temp (!) 100.9 ?F (38.3 ?C)  ?BP 122/75  ?Pulse Rate (!) 110  ?Resp 17  ?SpO2 93 %  ?O2 Device Room Air  ?Assess: MEWS Score  ?MEWS Temp 1  ?MEWS Systolic 0  ?MEWS Pulse 1  ?MEWS RR 0  ?MEWS LOC 0  ?MEWS Score 2  ?MEWS Score Color Yellow  ?Assess: if the MEWS score is Yellow or Red  ?Were vital signs taken at a resting state? Yes  ?Focused Assessment Change from prior assessment (see assessment flowsheet)  ?Does the patient meet 2 or more of the SIRS criteria? No  ?MEWS guidelines implemented *See Row Information* Yes  ?Treat  ?MEWS Interventions Administered prn meds/treatments  ?Take Vital Signs  ?Increase Vital Sign Frequency  Yellow: Q 2hr X 2 then Q 4hr X 2, if remains yellow, continue Q 4hrs  ?Escalate  ?MEWS: Escalate Yellow: discuss with charge nurse/RN and consider discussing with provider and RRT  ?Notify: Charge Nurse/RN  ?Name of Charge Nurse/RN Notified Dorathy Daft  ?Date Charge Nurse/RN Notified 06/17/21  ?Time Charge Nurse/RN Notified [phone]  ?Notify: Provider  ?Provider Name/Title not at this time  ?Notify: Rapid Response  ?Name of Rapid Response RN Notified Not at this time  ?Assess: SIRS CRITERIA  ?SIRS Temperature  0  ?SIRS Pulse 1  ?SIRS Respirations  0  ?SIRS WBC 0  ?SIRS Score Sum  1  ? ?Tylenol given for fever, dilaudid given for pain. Will reassess with next set of vitals at 545 am ?

## 2021-06-17 NOTE — Assessment & Plan Note (Addendum)
Present on admission with leukocytosis, tachycardia in the setting of acute pyelonephritis.  Lactic acid was normal and no other evidence of organ dysfunction. ?Treated with IV antibiotics and fluids. ?Sepsis physiology resolved.  ?

## 2021-06-17 NOTE — Hospital Course (Addendum)
Gloria [name] is a 41 y.o. female with medical history significant of migraine headache, tobacco use, anemia, pregnancy-induced hypertension, who presented to the ED on 06/16/2021 with progressively worsening nausea, vomiting, abdominal pain, subjective fever. ? ?Evaluation in the ED is consistent with acute right-sided pyelonephritis.  Patient did criteria for sepsis with leukocytosis, tachycardia.  Started on empiric Rocephin  pending cultures , IV fluids and admitted to the hospital. ? ? ? ?3/28: Patient clinically improved, afebrile and feeling better.  Stable for discharge home to complete treatment with PO antibiotic. ?

## 2021-06-17 NOTE — Assessment & Plan Note (Signed)
Counseled regarding importance of cessation.  Nicotine patch ordered. ?

## 2021-06-17 NOTE — Assessment & Plan Note (Addendum)
Patient has ongoing fever chills, nausea vomiting and flank pain. ?-- Treated with empiric Rocephin ?-- Urine cultures with pan-sensitive E. Coli ?-- Blood culture negative to date.  ?-- No longer needing antiemetics or pain medication ?-- Discharge on Bactrim to complete 10 day course. ?

## 2021-06-17 NOTE — Assessment & Plan Note (Signed)
Continue Imitrex, as needed Tylenol ?

## 2021-06-17 NOTE — TOC Initial Note (Signed)
Transition of Care (TOC) - Initial/Assessment Note  ? ? ?Patient Details  ?Name: Gloria [name] ?MRN: [id number] ?Date of Birth: [birth year] ? ?Transition of Care (TOC) CM/SW Contact:    ?Chapman Fitch, RN ?Phone Number: ?06/17/2021, 9:04 AM ? ?Clinical Narrative:                 ? ? ? .toc ?  ? ? ?Patient Goals and CMS Choice ?  ?  ?  ? ?Expected Discharge Plan and Services ?  ?  ?  ?  ?  ?                ?  ?  ?  ?  ?  ?  ?  ?  ?  ?  ? ?Prior Living Arrangements/Services ?  ?  ?  ?       ?  ?  ?  ?  ? ?Activities of Daily Living ?Home Assistive Devices/Equipment: None ?ADL Screening (condition at time of admission) ?Patient's cognitive ability adequate to safely complete daily activities?: Yes ?Is the patient deaf or have difficulty hearing?: No ?Does the patient have difficulty seeing, even when wearing glasses/contacts?: No ?Does the patient have difficulty concentrating, remembering, or making decisions?: No ?Patient able to express need for assistance with ADLs?: Yes ?Does the patient have difficulty dressing or bathing?: No ?Independently performs ADLs?: Yes (appropriate for developmental age) ?Does the patient have difficulty walking or climbing stairs?: No ?Weakness of Legs: None ?Weakness of Arms/Hands: None ? ?Permission Sought/Granted ?  ?  ?   ?   ?   ?   ? ?Emotional Assessment ?  ?  ?  ?  ?  ?  ? ?Admission diagnosis:  Pyelonephritis [N12] ?Acute pyelonephritis [N10] ?Sepsis without acute organ dysfunction, due to unspecified organism [facility]) [A41.9] ?Patient Active Problem List  ? Diagnosis Date Noted  ? Acute pyelonephritis 06/16/2021  ? Sepsis (HCC) 06/16/2021  ? Tobacco use 06/16/2021  ? Migraine 06/16/2021  ? Labor and delivery, indication for care 12/29/2018  ? Preterm delivery, delivered 02/02/2018  ? Preterm labor in third trimester 01/31/2018  ? Preterm uterine contractions in third trimester, antepartum 01/30/2018  ? Preterm uterine contractions, antepartum, third trimester 01/23/2018  ? SVD  (spontaneous vaginal delivery) 08/16/2014  ? Indication for care in labor or delivery 08/15/2014  ? 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 ?PCP:  Marisue Ivan, MD ?Pharmacy:   ?[id number] - [place] - [address] ?[address] ?[place] [address] ?Phone: [phone] Fax: [phone] ? ?[facility] [address] [place] - [address] ?[address] ?[place] [address] ?Phone: [phone] Fax: [phone] ? ? ? ? ?Social Determinants of Health (SDOH) Interventions ?  ? ?Readmission Risk Interventions ?   ? View : No data to display.  ?  ?  ?  ? ? ? ?

## 2021-06-17 NOTE — Progress Notes (Signed)
?   06/17/21 0523  ?Assess: MEWS Score  ?Temp 99.5 ?F (37.5 ?C)  ?BP 120/75  ?Pulse Rate 95  ?Resp 17  ?SpO2 98 %  ?O2 Device Room Air  ?Assess: MEWS Score  ?MEWS Temp 0  ?MEWS Systolic 0  ?MEWS Pulse 0  ?MEWS RR 0  ?MEWS LOC 0  ?MEWS Score 0  ?MEWS Score Color Green  ?Document  ?Patient Outcome Stabilized after interventions  ?Progress note created (see row info) Yes  ?Assess: SIRS CRITERIA  ?SIRS Temperature  0  ?SIRS Pulse 1  ?SIRS Respirations  0  ?SIRS WBC 0  ?SIRS Score Sum  1  ? ?Patient resting quietly at this time. ?

## 2021-06-18 LAB — CBC
HCT: 35.6 % — ABNORMAL LOW (ref 36.0–46.0)
Hemoglobin: 11.8 g/dL — ABNORMAL LOW (ref 12.0–15.0)
MCH: 30.5 pg (ref 26.0–34.0)
MCHC: 33.1 g/dL (ref 30.0–36.0)
MCV: 92 fL (ref 80.0–100.0)
Platelets: 233 10*3/uL (ref 150–400)
RBC: 3.87 MIL/uL (ref 3.87–5.11)
RDW: 12.2 % (ref 11.5–15.5)
WBC: 11.9 10*3/uL — ABNORMAL HIGH (ref 4.0–10.5)
nRBC: 0 % (ref 0.0–0.2)

## 2021-06-18 LAB — URINE CULTURE: Culture: >=100000 — AB

## 2021-06-18 MED ORDER — SULFAMETHOXAZOLE-TRIMETHOPRIM 800-160 MG PO TABS: 1.0000 | ORAL_TABLET | Freq: Two times a day (BID) | ORAL | 0 refills | Status: AC

## 2021-06-18 NOTE — Progress Notes (Signed)
Pt discharged per MD order. IV removed. Discharge instructions reviewed with pt. Pt verbalized understanding with all questions answered to pt satisfaction. Pt declined wheelchair and walked downstairs.  ?

## 2021-06-18 NOTE — Discharge Summary (Addendum)
?Physician Discharge Summary ?  ?Patient: Gloria [name] MRN: [id number] DOB: [birth year]  ?Admit date:     06/16/2021  ?Discharge date: 06/18/2021  ?Discharge Physician: Pennie Banter  ? ?PCP: Marisue Ivan, MD  ? ?Recommendations at discharge:  ? ?Follow-up with primary care in 1 to 2 weeks ?Repeat BMP, CBC in 1 to 2 weeks ?Follow-up with neurosurgery in clinic to discuss cervical MRI findings ? ?Discharge Diagnoses: ?Principal Problem: ?  Acute pyelonephritis ?Active Problems: ?  Sepsis (HCC) ?  Migraine ?  Tobacco use ?  Cervical myelopathy with cervical radiculopathy (HCC) ? ? ?Hospital Course: ?Gloria [name] is a 41 y.o. female with medical history significant of migraine headache, tobacco use, anemia, pregnancy-induced hypertension, who presented to the ED on 06/16/2021 with progressively worsening nausea, vomiting, abdominal pain, subjective fever. ? ?Evaluation in the ED is consistent with acute right-sided pyelonephritis.  Patient did criteria for sepsis with leukocytosis, tachycardia.  Started on empiric Rocephin  pending cultures , IV fluids and admitted to the hospital. ? ? ? ?3/28: Patient clinically improved, afebrile and feeling better.  Stable for discharge home to complete treatment with PO antibiotic. ? ?Assessment and Plan: ?* Acute pyelonephritis ?Patient has ongoing fever chills, nausea vomiting and flank pain. ?-- Treated with empiric Rocephin ?-- Urine cultures with pan-sensitive E. Coli ?-- Blood culture negative to date.  ?-- No longer needing antiemetics or pain medication ?-- Discharge on Bactrim to complete 10 day course. ? ?Sepsis (HCC) ?Present on admission with leukocytosis, tachycardia in the setting of acute pyelonephritis.  Lactic acid was normal and no other evidence of organ dysfunction. ?Treated with IV antibiotics and fluids. ?Sepsis physiology resolved.  ? ?Migraine ?Continue Imitrex, as needed Tylenol ? ?Tobacco use ?Counseled regarding importance of cessation.  Nicotine  patch ordered. ? ?Cervical myelopathy with cervical radiculopathy (HCC) ?Pt had MRI C-spine scheduled for 3/29, obtained this admission to expedite follow up. ?MRI showed multilevel foraminal stenosis in addition to central canal stenosis with cord deformity at C4-C5 level. ?--Follow up with Neurosurgery, Dr. Myer Haff is being arranged for next week. ?--Continue outpatient PT as scheduled ? ? ? ? ?  ? ? ?Consultants: None ?Procedures performed: None  ?Disposition: Home ?Diet recommendation:  ?Discharge Diet Orders (From admission, onward)  ? ?  Start     Ordered  ? 06/18/21 0000  Diet - low sodium heart healthy       ? 06/18/21 1402  ? ?  ?  ? ?  ? ?Regular diet ?DISCHARGE MEDICATION: ?Allergies as of 06/18/2021   ?No Known Allergies ?  ? ?  ?Medication List  ?  ? ?STOP taking these medications   ? ?ferrous sulfate 325 (65 FE) MG tablet ?  ?ondansetron 4 MG disintegrating tablet ?Commonly known as: ZOFRAN-ODT ?  ?oxyCODONE 5 MG immediate release tablet ?Commonly known as: Oxy IR/ROXICODONE ?  ? ?  ? ?TAKE these medications   ? ?clobetasol ointment 0.05 % ?Commonly known as: TEMOVATE ?Apply 1 application topically daily as needed for irritation. ?  ?docusate sodium 100 MG capsule ?Commonly known as: COLACE ?Take 1 capsule (100 mg total) by mouth 2 (two) times daily. To keep stools soft ?  ?gabapentin 800 MG tablet ?Commonly known as: Neurontin ?Take 1 tablet (800 mg total) by mouth at bedtime for 14 days. Take nightly for 3 days, then up to 14 days as needed ?  ?sulfamethoxazole-trimethoprim 800-160 MG tablet ?Commonly known as: BACTRIM DS ?Take 1 tablet by mouth 2 (two)  times daily for 7 days. ?  ? ?  ? ? Follow-up Information   ? ? Marisue IvanLinthavong, Kanhka, MD. Go on 06/21/2021.   ?Specialty: Family Medicine ?Why: Hospial follow up10:30am appointment ?Contact information: ?[address] ?[facility] ?[place] [address] ?[phone] ? ? ?  ?  ? ? Venetia NightYarbrough, Chester, MD. Nyra CapesGo on 06/27/2021.   ?Specialty:  Neurosurgery ?Why: For cervial myelopathy 8:30 am appointment ?Contact information: ?[address] ?[place] [address] ?[phone] ? ? ?  ?  ? ?  ?  ? ?  ? ?Discharge Exam: ?Filed Weights  ? 06/16/21 0955  ?Weight: 64.4 kg  ? ?General exam: awake, alert, no acute distress ?HEENT: atraumatic, clear conjunctiva, anicteric sclera, moist mucus membranes, hearing grossly normal  ?Respiratory system: CTAB, no wheezes, rales or rhonchi, normal respiratory effort. ?Cardiovascular system: normal S1/S2,  RRR, no JVD, murmurs, rubs, gallops,  no pedal edema.   ?Gastrointestinal system: soft, NT, ND, no HSM felt, +bowel sounds. ?Central nervous system: A&O x4. no gross focal neurologic deficits, normal speech ?Extremities: moves all , no edema, normal tone ?Skin: dry, intact, normal temperature, normal color, No rashes, lesions or ulcers ?Psychiatry: normal mood, congruent affect, judgement and insight appear normal ? ? ?Condition at discharge: stable ? ?The results of significant diagnostics from this hospitalization (including imaging, microbiology, ancillary and laboratory) are listed below for reference.  ? ?Imaging Studies: ?MR CERVICAL SPINE WO CONTRAST ? ?Result Date: 06/17/2021 ?CLINICAL DATA:  Cervical radiculopathy, no red flags EXAM: MRI CERVICAL SPINE WITHOUT CONTRAST TECHNIQUE: Multiplanar, multisequence MR imaging of the cervical spine was performed. No intravenous contrast was administered. COMPARISON:  None. FINDINGS: Alignment: Reversal of the normal cervical lordosis. Slight anterolisthesis of C3 on C4. Vertebrae: Degenerative/discogenic endplate signal changes about the right eccentric C4-C5 disc. Otherwise, no focal marrow edema chest acute fracture discitis/osteomyelitis. No suspicious bone lesions. Cord: Normal cord signal. Posterior Fossa, vertebral arteries, paraspinal tissues: Visualized vertebral artery flow voids are maintained. No evidence of acute abnormality in the visualized posterior  fossa. No paraspinal edema. Disc levels: C2-C3: No significant disc protrusion, foraminal stenosis, or canal stenosis. C3-C4: Small posterior disc osteophyte complex without significant canal or foraminal stenosis. C4-C5: Posterior disc osteophyte complex with right greater than left facet and uncovertebral hypertrophy. Resulting moderate to severe right and moderate left foraminal stenosis. Moderate canal stenosis with deformity of the cord. C5-C6: Posterior disc osteophyte complex with right greater than left facet and uncovertebral hypertrophy. Resulting moderate right greater than left foraminal stenosis. Mild to moderate canal stenosis. C6-C7: Posterior disc osteophyte complex with left greater than right facet and uncovertebral hypertrophy. Resulting mild left foraminal stenosis and mild canal stenosis. C7-T1: No significant disc protrusion, foraminal stenosis, or canal stenosis. IMPRESSION: 1. At C4-C5, moderate to severe right and moderate left foraminal stenosis, moderate canal stenosis, and deformity of the cord. 2. At C5-C6, moderate right greater than left foraminal stenosis and mild to moderate canal stenosis. 3. At C6-C7, mild left foraminal stenosis and mild canal stenosis. Electronically Signed   By: Feliberto HartsFrederick S Jones M.D.   On: 06/17/2021 16:17  ? ?CT ABDOMEN PELVIS W CONTRAST ? ?Result Date: 06/16/2021 ?CLINICAL DATA:  41 year old female with history of right-sided abdominal pain and emesis. Some findings of both cholecystitis and appendicitis on physical examination. EXAM: CT ABDOMEN AND PELVIS WITH CONTRAST TECHNIQUE: Multidetector CT imaging of the abdomen and pelvis was performed using the standard protocol following bolus administration of intravenous contrast. RADIATION DOSE REDUCTION: This exam was performed  according to the departmental dose-optimization program which includes automated exposure control, adjustment of the mA and/or kV according to patient size and/or use of iterative  reconstruction technique. CONTRAST:  34mL OMNIPAQUE IOHEXOL 300 MG/ML  SOLN COMPARISON:  No priors. FINDINGS: Lower chest: Unremarkable. Hepatobiliary: No suspicious cystic or solid hepatic lesions. No intra or e

## 2021-06-19 ENCOUNTER — Ambulatory Visit: Payer: Medicaid Other

## 2021-06-19 DIAGNOSIS — M5412 Radiculopathy, cervical region: Secondary | ICD-10-CM | POA: Diagnosis present

## 2021-06-19 NOTE — Assessment & Plan Note (Signed)
Pt had MRI C-spine scheduled for 3/29, obtained this admission to expedite follow up. ?MRI showed multilevel foraminal stenosis in addition to central canal stenosis with cord deformity at C4-C5 level. ?--Follow up with Neurosurgery, Dr. Myer Haff is being arranged for next week. ?--Continue outpatient PT as scheduled ?

## 2021-06-21 LAB — CULTURE, BLOOD (ROUTINE X 2)
Culture: NO GROWTH
Culture: NO GROWTH

## 2021-06-21 LAB — CULTURE, BLOOD (SINGLE)
Culture: NO GROWTH
Special Requests: ADEQUATE

## 2021-08-30 ENCOUNTER — Other Ambulatory Visit: Payer: Self-pay | Admitting: Neurology

## 2021-08-30 DIAGNOSIS — R200 Anesthesia of skin: Secondary | ICD-10-CM

## 2021-08-30 DIAGNOSIS — R292 Abnormal reflex: Secondary | ICD-10-CM

## 2021-08-30 DIAGNOSIS — R519 Headache, unspecified: Secondary | ICD-10-CM

## 2021-09-02 ENCOUNTER — Ambulatory Visit
Admission: RE | Admit: 2021-09-02 | Discharge: 2021-09-02 | Disposition: A | Discharge status: Home / Self Care | Payer: Medicaid Other | Source: Ambulatory Visit | Attending: Neurology | Admitting: Neurology

## 2021-09-02 DIAGNOSIS — R292 Abnormal reflex: Secondary | ICD-10-CM

## 2021-09-02 DIAGNOSIS — R200 Anesthesia of skin: Secondary | ICD-10-CM

## 2021-09-02 DIAGNOSIS — G8929 Other chronic pain: Secondary | ICD-10-CM

## 2021-09-02 IMAGING — MR MR HEAD W/O CM
11 series · 48 of 48 positions shown · non-contrast
Comparison: Cervical spine MRI [DATE].  Head CT [DATE].

CLINICAL DATA: Provided history: Chronic intractable headache,
unspecified headache type. Numbness and tingling. SANJUAN reflex.
Additional history provided by scanning technologist: Patient
reports headaches, as well as right-sided ear and jaw pain, numbness
and tingling in right arm and hand.

EXAM:
MRI HEAD WITHOUT CONTRAST
TECHNIQUE: Multiplanar, multiecho pulse sequences of the brain and surrounding
structures were obtained without intravenous contrast.

[Series 5: T1 · sagittal · 4.0mm · 0.75mm/px · 2 of 31 slices shown (1 of 2)]
[im 1/31]
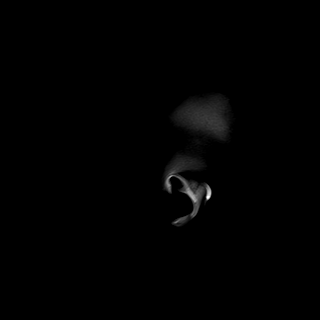
[im 31/31]
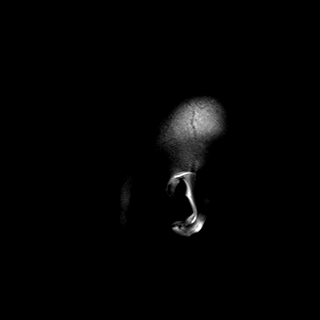

[Series 6: DWI · axial · 3.0mm · 0.94mm/px · z∈[-74,+79]mm · 11 of 176 slices shown (1 of 3)]
[im 1/176]
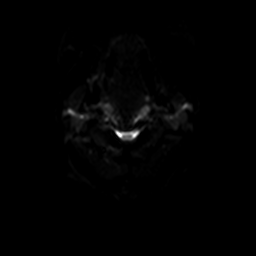
[im 18/176]
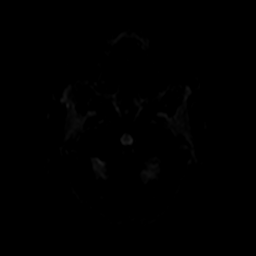
[im 36/176]
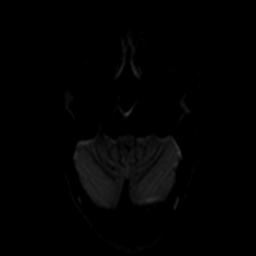
[im 53/176]
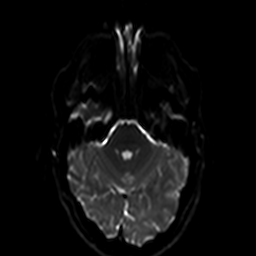
[im 71/176]
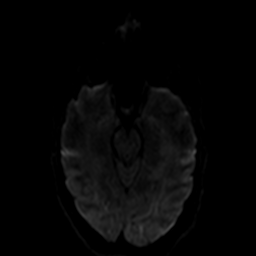
[im 88/176]
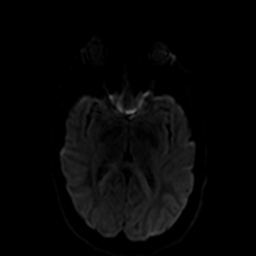
[im 106/176]
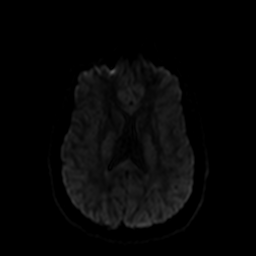
[im 123/176]
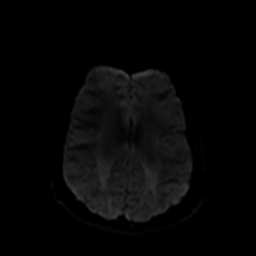
[im 141/176]
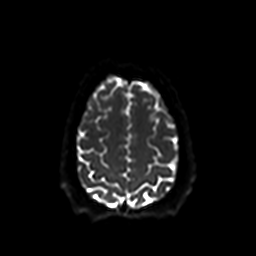
[im 158/176]
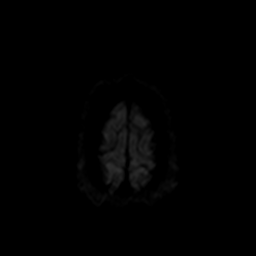
[im 176/176]
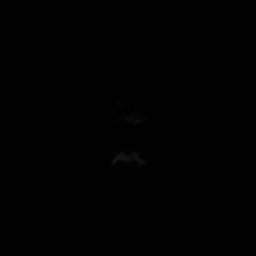

[Series 7: ax dwi_tracew · axial · 3.0mm · 0.94mm/px · z∈[-74,+79]mm · 6 of 88 slices shown]
[im 1/88]
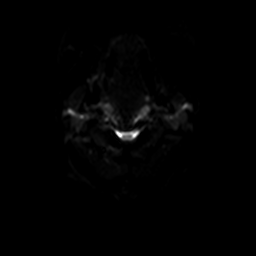
[im 18/88]
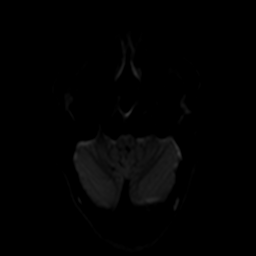
[im 35/88]
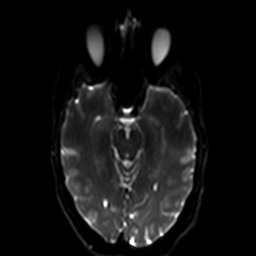
[im 53/88]
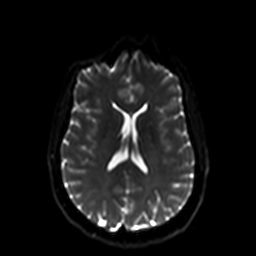
[im 70/88]
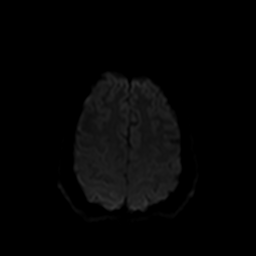
[im 88/88]
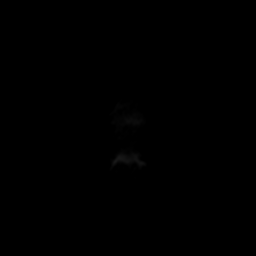

[Series 8: ax dwi_adc · axial · 3.0mm · 0.94mm/px · z∈[-74,+79]mm · 3 of 44 slices shown]
[im 1/44]
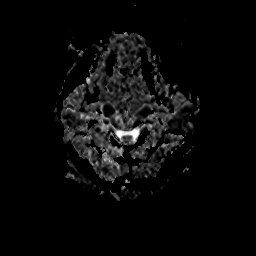
[im 22/44]
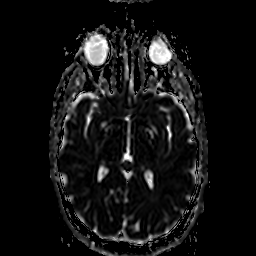
[im 44/44]
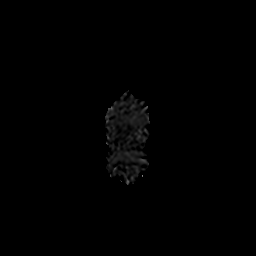

[Series 9: DWI · coronal · 5.0mm · 1.44mm/px · 5 of 70 slices shown (2 of 3)]
[im 1/70]
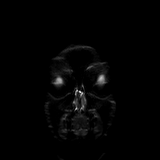
[im 18/70]
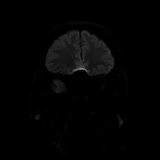
[im 35/70]
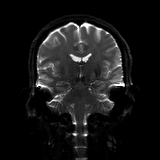
[im 52/70]
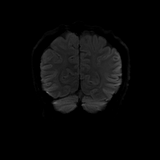
[im 70/70]
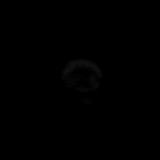

[Series 10: DWI · coronal · 5.0mm · 1.44mm/px · 2 of 35 slices shown (3 of 3)]
[im 1/35]
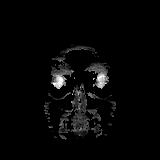
[im 35/35]
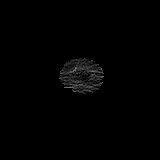

[Series 11: T2 · axial · 4.0mm · 0.36mm/px · z∈[-63,+93]mm · 2 of 30 slices shown (1 of 2)]
[im 1/30]
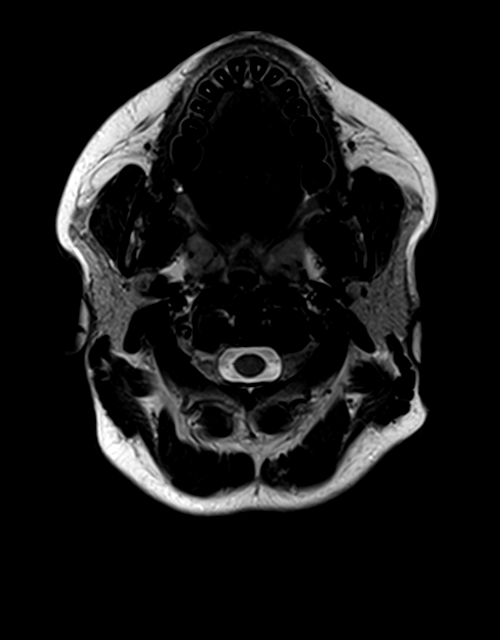
[im 30/30]
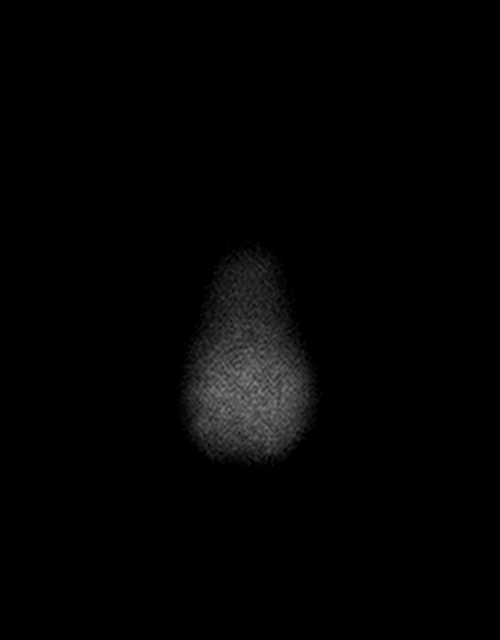

[Series 12: FLAIR · axial · 3.0mm · 0.72mm/px · z∈[-60,+90]mm · 2 of 26 slices shown]
[im 1/26]
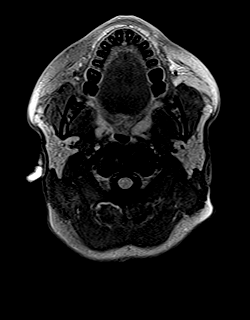
[im 26/26]
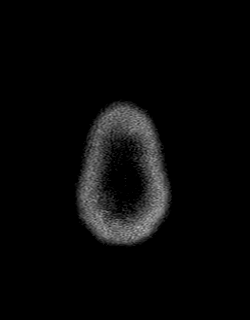

[Series 14: swi_images · axial · 3.0mm · 0.90mm/px · z∈[-61,+91]mm · 3 of 52 slices shown]
[im 1/52]
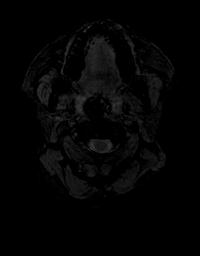
[im 26/52]
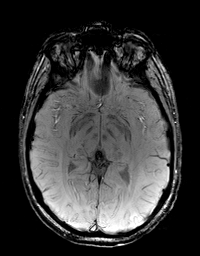
[im 52/52]
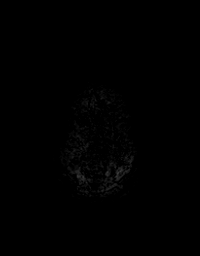

[Series 15: T1 · axial · 1.0mm · 0.90mm/px · z∈[-61,+95]mm · 10 of 156 slices shown (2 of 2)]
[im 1/156]
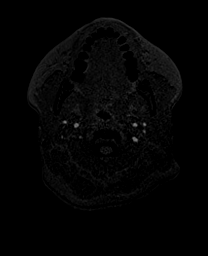
[im 18/156]
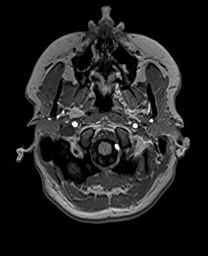
[im 35/156]
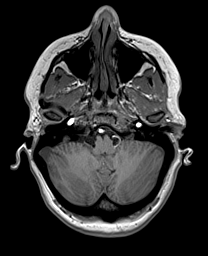
[im 52/156]
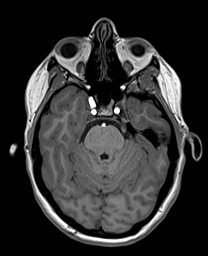
[im 69/156]
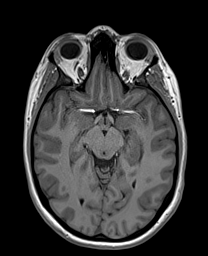
[im 87/156]
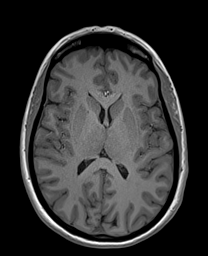
[im 104/156]
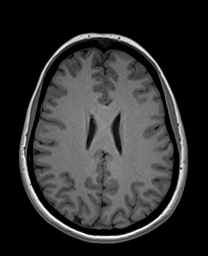
[im 121/156]
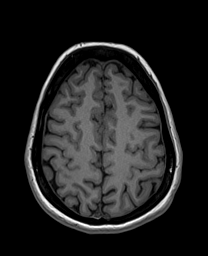
[im 138/156]
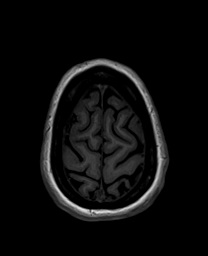
[im 156/156]
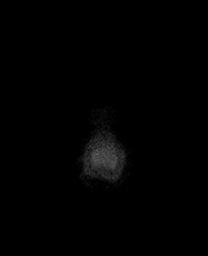

[Series 16: T2 · coronal · 4.5mm · 0.36mm/px · 2 of 33 slices shown (2 of 2)]
[im 1/33]
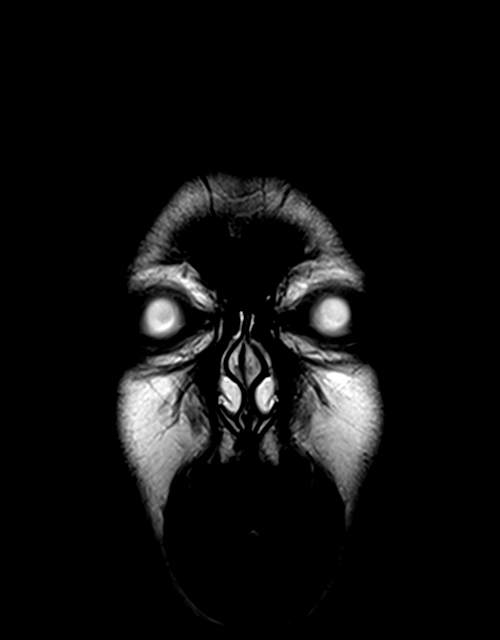
[im 33/33]
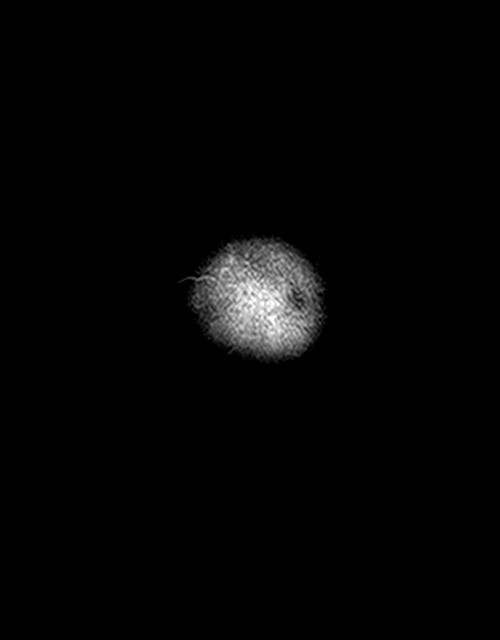

[48 of 48 positions shown; findings below may reference images not displayed]

FINDINGS: Brain:

Cerebral volume is normal.

A tiny chronic infarct is questioned within the right cerebellar
hemisphere (series 11, image 7).

No cortical encephalomalacia is identified. No significant cerebral
white matter disease.

There is no acute infarct.

No evidence of an intracranial mass.

No chronic intracranial blood products.

No extra-axial fluid collection.

No midline shift.

Vascular: Maintained flow voids within the proximal large arterial
vessels. Probable small right cerebellar developmental venous
anomaly (anatomic variant).

Skull and upper cervical spine: No focal suspicious marrow lesion.
Incompletely assessed cervical spondylosis.

Sinuses/Orbits: No mass or acute finding within the imaged orbits.
Trace mucosal thickening within the bilateral ethmoid sinuses.
IMPRESSION: No evidence of acute intracranial abnormality.

A tiny chronic infarct is questioned within the right cerebellar
hemisphere.

Otherwise unremarkable non-contrast MRI appearance of the brain.

## 2021-09-05 ENCOUNTER — Other Ambulatory Visit: Payer: Self-pay | Admitting: Neurology

## 2021-09-05 DIAGNOSIS — G8929 Other chronic pain: Secondary | ICD-10-CM

## 2021-09-05 DIAGNOSIS — R200 Anesthesia of skin: Secondary | ICD-10-CM

## 2021-09-27 ENCOUNTER — Other Ambulatory Visit: Payer: Self-pay

## 2021-09-27 DIAGNOSIS — Z01818 Encounter for other preprocedural examination: Secondary | ICD-10-CM

## 2021-10-03 ENCOUNTER — Encounter: Payer: Self-pay | Admitting: Neurosurgery

## 2021-10-03 ENCOUNTER — Encounter
Admission: RE | Admit: 2021-10-03 | Discharge: 2021-10-03 | Disposition: A | Discharge status: Home / Self Care | Payer: Medicaid Other | Source: Ambulatory Visit | Attending: Neurosurgery | Admitting: Neurosurgery

## 2021-10-03 ENCOUNTER — Other Ambulatory Visit: Payer: Self-pay

## 2021-10-03 VITALS — BP 118/82 | HR 97 | Resp 15 | Ht 63.0 in | Wt 143.0 lb

## 2021-10-03 DIAGNOSIS — Z01812 Encounter for preprocedural laboratory examination: Secondary | ICD-10-CM | POA: Insufficient documentation

## 2021-10-03 DIAGNOSIS — Z01818 Encounter for other preprocedural examination: Secondary | ICD-10-CM

## 2021-10-03 HISTORY — DX: Headache, unspecified: R51.9

## 2021-10-03 HISTORY — DX: Unspecified osteoarthritis, unspecified site: M19.90

## 2021-10-03 HISTORY — DX: Polyneuropathy, unspecified: G62.9

## 2021-10-03 HISTORY — DX: Anemia, unspecified: D64.9

## 2021-10-03 LAB — CBC
HCT: 43.9 % (ref 36.0–46.0)
Hemoglobin: 14.8 g/dL (ref 12.0–15.0)
MCH: 30.7 pg (ref 26.0–34.0)
MCHC: 33.7 g/dL (ref 30.0–36.0)
MCV: 91.1 fL (ref 80.0–100.0)
Platelets: 313 10*3/uL (ref 150–400)
RBC: 4.82 MIL/uL (ref 3.87–5.11)
RDW: 12 % (ref 11.5–15.5)
WBC: 9.6 10*3/uL (ref 4.0–10.5)
nRBC: 0 % (ref 0.0–0.2)

## 2021-10-03 LAB — BASIC METABOLIC PANEL
Anion gap: 8 (ref 5–15)
BUN: 13 mg/dL (ref 6–20)
CO2: 26 mmol/L (ref 22–32)
Calcium: 9.3 mg/dL (ref 8.9–10.3)
Chloride: 105 mmol/L (ref 98–111)
Creatinine, Ser: 0.85 mg/dL (ref 0.44–1.00)
GFR, Estimated: >60 mL/min (ref >60)
Glucose, Bld: 95 mg/dL (ref 70–99)
Potassium: 4.1 mmol/L (ref 3.5–5.1)
Sodium: 139 mmol/L (ref 135–145)

## 2021-10-03 LAB — TYPE AND SCREEN
ABO/RH(D): A POS
Antibody Screen: NEGATIVE

## 2021-10-03 LAB — SURGICAL PCR SCREEN
MRSA, PCR: NEGATIVE
Staphylococcus aureus: NEGATIVE

## 2021-10-03 NOTE — Patient Instructions (Signed)
Your procedure is scheduled on: 10/14/21 Report to DAY SURGERY DEPARTMENT LOCATED ON 2ND FLOOR MEDICAL MALL ENTRANCE. To find out your arrival time please call [phone] between 1PM - 3PM on 10/11/21.  Remember: Instructions that are not followed completely may result in serious medical risk, up to and including death, or upon the discretion of your surgeon and anesthesiologist your surgery may need to be rescheduled.     _X__ 1. Do not eat food after midnight the night before your procedure.                 No gum chewing or hard candies. You may drink clear liquids up to 2 hours                 before you are scheduled to arrive for your surgery- DO not drink clear                 liquids within 2 hours of the start of your surgery.                 Clear Liquids include:  water, apple juice without pulp, clear carbohydrate                 drink such as Clearfast or Gatorade, Black Coffee or Tea (Do not add                 anything to coffee or tea). Diabetics water only  __X__2.  On the morning of surgery brush your teeth with toothpaste and water, you                 may rinse your mouth with mouthwash if you wish.  Do not swallow any              toothpaste of mouthwash.     _X__ 3.  No Alcohol for 24 hours before or after surgery.   _X__ 4.  Do Not Smoke or use e-cigarettes For 24 Hours Prior to Your Surgery.                 Do not use any chewable tobacco products for at least 6 hours prior to                 surgery.  ____  5.  Bring all medications with you on the day of surgery if instructed.   __X__  6.  Notify your doctor if there is any change in your medical condition      (cold, fever, infections).     Do not wear jewelry, make-up, hairpins, clips or nail polish. Do not wear lotions, powders, or perfumes.  Do not shave body hair 48 hours prior to surgery. Men may shave face and neck. Do not bring valuables to the hospital.    [facility] is not responsible for any  belongings or valuables.  Contacts, dentures/partials or body piercings may not be worn into surgery. Bring a case for your contacts, glasses or hearing aids, a denture cup will be supplied. Leave your suitcase in the car. After surgery it may be brought to your room. For patients admitted to the hospital, discharge time is determined by your treatment team.   Patients discharged the day of surgery will not be allowed to drive home.   Please read over the following fact sheets that you were given:   MRSA Information, chg soap  __X__ Take these medicines the morning of surgery with A SIP  OF WATER:    1. gabapentin (NEURONTIN) 400 MG capsule  2.   3.   4.  5.  6.  ____ Fleet Enema (as directed)   __X__ Use CHG Soap/SAGE wipes as directed  ____ Use inhalers on the day of surgery  ____ Stop metformin/Janumet/Farxiga 2 days prior to surgery    ____ Take 1/2 of usual insulin dose the night before surgery. No insulin the morning          of surgery.   ____ Stop Blood Thinners Coumadin/Plavix/Xarelto/Pleta/Pradaxa/Eliquis/Effient/Aspirin  on   Or contact your Surgeon, Cardiologist or Medical Doctor regarding  ability to stop your blood thinners  __X__ Stop Anti-inflammatories 7 days before surgery such as Advil, Ibuprofen, Motrin,  BC or Goodies Powder, Naprosyn, Naproxen, Aleve, Aspirin                    MAY TAKE TYLENOL INSTEAD IF NEEDED   __X__ Stop all herbal supplements, fish oil or vitamin E until after surgery.    ____ Bring C-Pap to the hospital.

## 2021-10-06 DIAGNOSIS — R519 Headache, unspecified: Secondary | ICD-10-CM | POA: Insufficient documentation

## 2021-10-07 ENCOUNTER — Other Ambulatory Visit: Payer: Self-pay

## 2021-10-07 ENCOUNTER — Emergency Department
Admission: EM | Admit: 2021-10-07 | Discharge: 2021-10-07 | Disposition: A | Discharge status: Home / Self Care | Payer: Medicaid Other | Attending: Emergency Medicine | Admitting: Emergency Medicine

## 2021-10-07 DIAGNOSIS — R519 Headache, unspecified: Secondary | ICD-10-CM

## 2021-10-07 DIAGNOSIS — G44209 Tension-type headache, unspecified, not intractable: Secondary | ICD-10-CM

## 2021-10-07 MED ORDER — DROPERIDOL 2.5 MG/ML IJ SOLN
2.5000 mg | Freq: Once | INTRAMUSCULAR | Status: AC
  Administered 2021-10-07: 2.5 mg via INTRAMUSCULAR
  Filled 2021-10-07: qty 2

## 2021-10-07 MED ORDER — DIPHENHYDRAMINE HCL 50 MG/ML IJ SOLN
50.0000 mg | Freq: Once | INTRAMUSCULAR | Status: AC
  Administered 2021-10-07: 50 mg via INTRAMUSCULAR
  Filled 2021-10-07: qty 1

## 2021-10-07 MED ORDER — LIDOCAINE 5 % EX PTCH: 1.0000 | MEDICATED_PATCH | Freq: Two times a day (BID) | CUTANEOUS | 1 refills | Status: DC

## 2021-10-07 MED ORDER — KETOROLAC TROMETHAMINE 30 MG/ML IJ SOLN
30.0000 mg | Freq: Once | INTRAMUSCULAR | Status: AC
  Administered 2021-10-07: 30 mg via INTRAMUSCULAR
  Filled 2021-10-07: qty 1

## 2021-10-07 NOTE — Discharge Instructions (Signed)
Please use lidocaine patches at your site of pain.  Apply 1 patch at a time, leave on for 12 hours, then remove for 12 hours.  12 hours on, 12 hours off.  Do not apply more than 1 patch at a time. 

## 2021-10-07 NOTE — ED Provider Notes (Signed)
[facility] Provider Note    Event Date/Time   First MD Initiated Contact with Patient 10/07/21 0104     (approximate)   History   Headache   HPI  Gloria [name] is a 41 y.o. female who presents to the ED for evaluation of Headache   I reviewed neurology clinic visit from 7/5 for evaluation of chronic daily headaches. I review 6/20 neurosurgical visit for cervical radiculopathy and myelopathy for which she has an upcoming C4-6 arthroplasty scheduled for 7/24.  Patient presents to the ED with her husband for evaluation of acute on chronic neck and headaches.  Denies any falls or injuries, fevers or weakness to the extremities.  Reports this is a typical headache, but more severe and she attributes this to being told by her neurosurgeon that she should stop taking NSAIDs in preparation of the surgery.  Reports using the other medications at her disposal at home with no relief of her symptoms.  She was advised to stop her NSAIDs on 7/17 (Monday) but reports that she stopped a couple days ago and instead and has been having worsening headaches this weekend.   Reports taking 3 Fioricet today without improvement of her symptoms.   Physical Exam   Triage Vital Signs: ED Triage Vitals [10/07/21 0100]  Enc Vitals Group     BP 134/80     Pulse Rate 79     Resp 16     Temp 98 F (36.7 C)     Temp Source Oral     SpO2 99 %     Weight 145 lb (65.8 kg)     Height 5\' 3"  (1.6 m)     Head Circumference      Peak Flow      Pain Score 10     Pain Loc      Pain Edu?      Excl. in GC?     Most recent vital signs: Vitals:   10/07/21 0100  BP: 134/80  Pulse: 79  Resp: 16  Temp: 98 F (36.7 C)  SpO2: 99%    General: Awake, no distress.  Seems photophobic and uncomfortable initially.  Ambulatory. CV:  Good peripheral perfusion.  Resp:  Normal effort.  Abd:  No distention.  MSK:  No deformity noted.  Neuro:  No focal deficits appreciated. Cranial  nerves II through XII intact 5/5 strength and sensation in all 4 extremities] Other:     ED Results / Procedures / Treatments   Labs (all labs ordered are listed, but only abnormal results are displayed) Labs Reviewed - No data to display  EKG   RADIOLOGY   Official radiology report(s): No results found.  PROCEDURES and INTERVENTIONS:  Procedures  Medications  ketorolac (TORADOL) 30 MG/ML injection 30 mg (30 mg Intramuscular Given 10/07/21 0121)  droperidol (INAPSINE) 2.5 MG/ML injection 2.5 mg (2.5 mg Intramuscular Given 10/07/21 0120)  diphenhydrAMINE (BENADRYL) injection 50 mg (50 mg Intramuscular Given 10/07/21 0119)     IMPRESSION / MDM / ASSESSMENT AND PLAN / ED COURSE  I reviewed the triage vital signs and the nursing notes.  Differential diagnosis includes, but is not limited to, acute on chronic headache, meningitis, cephalitis, stroke, ICH  41 year old female with chronic headache disorder presents to the ED with an acute headache in the setting of stopping her NSAIDs in preparation for an upcoming procedure, with resolved symptoms with IM nonnarcotic multimodal analgesia and suitable for outpatient management.  Doubt more severe pathology  such as cord compression, meningitis, encephalitis.  We discussed a one-time dose of Toradol today, but abstaining from NSAIDs at home per the recommendations of neurosurgery.  Discussed multimodal analgesia otherwise and I provided prescription for lidocaine patches for her neck.  We discussed appropriate return precautions.  She says that she is can reach out to her neurologist this morning.   Clinical Course as of 10/07/21 0207  Mon Oct 07, 2021  0145 Reassessed.  Feeling better. [DS]  0206 Reassessed. Feeling better and requesting dc. We discussed management at home and return precautions [DS]    Clinical Course User Index [DS] Delton Prairie, MD     FINAL CLINICAL IMPRESSION(S) / ED DIAGNOSES   Final diagnoses:  None      Rx / DC Orders   ED Discharge Orders     None        Note:  This document was prepared using Dragon voice recognition software and may include unintentional dictation errors.   Delton Prairie, MD 10/07/21 [phone]

## 2021-10-07 NOTE — ED Triage Notes (Signed)
Patient with history of headaches, reports severe headache that began around 10 am and progressed through day. States she vomited 3 times today. Patient took 3 Fioricet without relief.

## 2021-10-14 ENCOUNTER — Encounter
Admission: RE | Disposition: A | Discharge status: Home / Self Care | Payer: Self-pay | Source: Ambulatory Visit | Attending: Neurosurgery

## 2021-10-14 ENCOUNTER — Ambulatory Visit: Payer: Medicaid Other | Admitting: Urgent Care

## 2021-10-14 ENCOUNTER — Ambulatory Visit: Payer: Medicaid Other

## 2021-10-14 ENCOUNTER — Other Ambulatory Visit: Payer: Self-pay

## 2021-10-14 ENCOUNTER — Encounter: Payer: Self-pay | Admitting: Neurosurgery

## 2021-10-14 ENCOUNTER — Ambulatory Visit
Admission: RE | Admit: 2021-10-14 | Discharge: 2021-10-14 | Disposition: A | Discharge status: Home / Self Care | Payer: Medicaid Other | Source: Ambulatory Visit | Attending: Neurosurgery | Admitting: Neurosurgery

## 2021-10-14 ENCOUNTER — Ambulatory Visit: Payer: Medicaid Other | Admitting: Certified Registered"

## 2021-10-14 DIAGNOSIS — M5412 Radiculopathy, cervical region: Secondary | ICD-10-CM | POA: Insufficient documentation

## 2021-10-14 DIAGNOSIS — G992 Myelopathy in diseases classified elsewhere: Secondary | ICD-10-CM | POA: Diagnosis not present

## 2021-10-14 DIAGNOSIS — G959 Disease of spinal cord, unspecified: Secondary | ICD-10-CM | POA: Insufficient documentation

## 2021-10-14 DIAGNOSIS — M4802 Spinal stenosis, cervical region: Secondary | ICD-10-CM | POA: Diagnosis not present

## 2021-10-14 HISTORY — PX: CERVICAL DISC ARTHROPLASTY: SHX587

## 2021-10-14 SURGERY — CERVICAL ANTERIOR DISC ARTHROPLASTY
Anesthesia: General | Site: Spine Cervical

## 2021-10-14 MED ORDER — FENTANYL CITRATE (PF) 100 MCG/2ML IJ SOLN
INTRAMUSCULAR | Status: DC | PRN
  Administered 2021-10-14 (×2): 50 ug via INTRAVENOUS

## 2021-10-14 MED ORDER — OXYCODONE-ACETAMINOPHEN 5-325 MG PO TABS: 1.0000 | ORAL_TABLET | ORAL | 0 refills | Status: AC | PRN

## 2021-10-14 MED ORDER — METHOCARBAMOL 500 MG PO TABS: 500.0000 mg | ORAL_TABLET | Freq: Once | ORAL | Status: AC

## 2021-10-14 MED ORDER — HYDROMORPHONE HCL 1 MG/ML IJ SOLN
INTRAMUSCULAR | Status: AC
  Administered 2021-10-14: 0.5 mg via INTRAVENOUS
  Filled 2021-10-14: qty 1

## 2021-10-14 MED ORDER — ORAL CARE MOUTH RINSE: 15.0000 mL | Freq: Once | OROMUCOSAL | Status: AC

## 2021-10-14 MED ORDER — FENTANYL CITRATE (PF) 100 MCG/2ML IJ SOLN
INTRAMUSCULAR | Status: AC
  Administered 2021-10-14: 50 ug via INTRAVENOUS
  Filled 2021-10-14: qty 2

## 2021-10-14 MED ORDER — KETOROLAC TROMETHAMINE 15 MG/ML IJ SOLN
15.0000 mg | Freq: Once | INTRAMUSCULAR | Status: AC
Start: 2021-10-14 — End: 2021-10-14
  Administered 2021-10-14: 15 mg via INTRAVENOUS

## 2021-10-14 MED ORDER — MIDAZOLAM HCL 2 MG/2ML IJ SOLN
INTRAMUSCULAR | Status: AC
  Filled 2021-10-14: qty 2

## 2021-10-14 MED ORDER — OXYCODONE HCL 5 MG/5ML PO SOLN: 5.0000 mg | Freq: Once | ORAL | Status: AC | PRN

## 2021-10-14 MED ORDER — SUCCINYLCHOLINE CHLORIDE 200 MG/10ML IV SOSY
PREFILLED_SYRINGE | INTRAVENOUS | Status: DC | PRN
  Administered 2021-10-14: 100 mg via INTRAVENOUS

## 2021-10-14 MED ORDER — PHENYLEPHRINE HCL-NACL 20-0.9 MG/250ML-% IV SOLN
INTRAVENOUS | Status: AC
  Filled 2021-10-14: qty 250

## 2021-10-14 MED ORDER — PROMETHAZINE HCL 25 MG/ML IJ SOLN: 6.2500 mg | INTRAMUSCULAR | Status: DC | PRN

## 2021-10-14 MED ORDER — GLYCOPYRROLATE 0.2 MG/ML IJ SOLN
INTRAMUSCULAR | Status: DC | PRN
  Administered 2021-10-14: .2 mg via INTRAVENOUS

## 2021-10-14 MED ORDER — LACTATED RINGERS IV SOLN: INTRAVENOUS | Status: DC

## 2021-10-14 MED ORDER — BUPIVACAINE-EPINEPHRINE (PF) 0.5% -1:200000 IJ SOLN
INTRAMUSCULAR | Status: DC | PRN
  Administered 2021-10-14: 6 mL

## 2021-10-14 MED ORDER — KETOROLAC TROMETHAMINE 15 MG/ML IJ SOLN
INTRAMUSCULAR | Status: AC
  Filled 2021-10-14: qty 1

## 2021-10-14 MED ORDER — CHLORHEXIDINE GLUCONATE 0.12 % MT SOLN: 15.0000 mL | Freq: Once | OROMUCOSAL | Status: AC

## 2021-10-14 MED ORDER — OXYCODONE HCL 5 MG PO TABS
ORAL_TABLET | ORAL | Status: AC
  Administered 2021-10-14: 5 mg via ORAL
  Filled 2021-10-14: qty 1

## 2021-10-14 MED ORDER — EPHEDRINE SULFATE (PRESSORS) 50 MG/ML IJ SOLN
INTRAMUSCULAR | Status: DC | PRN
  Administered 2021-10-14 (×4): 5 mg via INTRAVENOUS

## 2021-10-14 MED ORDER — VASOPRESSIN 20 UNIT/ML IV SOLN
INTRAVENOUS | Status: DC | PRN
  Administered 2021-10-14 (×2): 2 [IU] via INTRAVENOUS

## 2021-10-14 MED ORDER — FAMOTIDINE 20 MG PO TABS
ORAL_TABLET | ORAL | Status: AC
  Administered 2021-10-14: 20 mg via ORAL
  Filled 2021-10-14: qty 1

## 2021-10-14 MED ORDER — OXYCODONE HCL 5 MG PO TABS: 5.0000 mg | ORAL_TABLET | Freq: Once | ORAL | Status: AC | PRN

## 2021-10-14 MED ORDER — LIDOCAINE HCL (PF) 2 % IJ SOLN
INTRAMUSCULAR | Status: AC
  Filled 2021-10-14: qty 10

## 2021-10-14 MED ORDER — LIDOCAINE HCL (CARDIAC) PF 100 MG/5ML IV SOSY
PREFILLED_SYRINGE | INTRAVENOUS | Status: DC | PRN
  Administered 2021-10-14: 100 mg via INTRAVENOUS

## 2021-10-14 MED ORDER — GABAPENTIN 300 MG PO CAPS
300.0000 mg | ORAL_CAPSULE | Freq: Once | ORAL | Status: AC
Start: 2021-10-14 — End: 2021-10-14
  Administered 2021-10-14: 300 mg via ORAL

## 2021-10-14 MED ORDER — DEXAMETHASONE SODIUM PHOSPHATE 10 MG/ML IJ SOLN
INTRAMUSCULAR | Status: DC | PRN
  Administered 2021-10-14: 10 mg via INTRAVENOUS

## 2021-10-14 MED ORDER — METHOCARBAMOL 500 MG PO TABS
ORAL_TABLET | ORAL | Status: AC
  Administered 2021-10-14: 500 mg via ORAL
  Filled 2021-10-14: qty 1

## 2021-10-14 MED ORDER — FENTANYL CITRATE (PF) 100 MCG/2ML IJ SOLN
INTRAMUSCULAR | Status: AC
  Filled 2021-10-14: qty 2

## 2021-10-14 MED ORDER — 0.9 % SODIUM CHLORIDE (POUR BTL) OPTIME
TOPICAL | Status: DC | PRN
  Administered 2021-10-14: 1000 mL

## 2021-10-14 MED ORDER — DEXMEDETOMIDINE HCL IN NACL 80 MCG/20ML IV SOLN
INTRAVENOUS | Status: AC
  Filled 2021-10-14: qty 20

## 2021-10-14 MED ORDER — CHLORHEXIDINE GLUCONATE 0.12 % MT SOLN
OROMUCOSAL | Status: AC
  Administered 2021-10-14: 15 mL via OROMUCOSAL
  Filled 2021-10-14: qty 15

## 2021-10-14 MED ORDER — BUPIVACAINE-EPINEPHRINE (PF) 0.5% -1:200000 IJ SOLN
INTRAMUSCULAR | Status: AC
  Filled 2021-10-14: qty 30

## 2021-10-14 MED ORDER — PROPOFOL 10 MG/ML IV BOLUS
INTRAVENOUS | Status: AC
  Filled 2021-10-14: qty 20

## 2021-10-14 MED ORDER — REMIFENTANIL HCL 1 MG IV SOLR
INTRAVENOUS | Status: DC | PRN
  Administered 2021-10-14: .15 ug/kg/min via INTRAVENOUS

## 2021-10-14 MED ORDER — DROPERIDOL 2.5 MG/ML IJ SOLN: 0.6250 mg | Freq: Once | INTRAMUSCULAR | Status: DC | PRN

## 2021-10-14 MED ORDER — PHENYLEPHRINE 80 MCG/ML (10ML) SYRINGE FOR IV PUSH (FOR BLOOD PRESSURE SUPPORT)
PREFILLED_SYRINGE | INTRAVENOUS | Status: DC | PRN
  Administered 2021-10-14: 160 ug via INTRAVENOUS
  Administered 2021-10-14: 80 ug via INTRAVENOUS
  Administered 2021-10-14: 160 ug via INTRAVENOUS
  Administered 2021-10-14: 80 ug via INTRAVENOUS
  Administered 2021-10-14: 160 ug via INTRAVENOUS

## 2021-10-14 MED ORDER — ONDANSETRON HCL 4 MG/2ML IJ SOLN
INTRAMUSCULAR | Status: DC | PRN
  Administered 2021-10-14: 4 mg via INTRAVENOUS

## 2021-10-14 MED ORDER — ACETAMINOPHEN 10 MG/ML IV SOLN
INTRAVENOUS | Status: AC
  Filled 2021-10-14: qty 100

## 2021-10-14 MED ORDER — PHENYLEPHRINE 80 MCG/ML (10ML) SYRINGE FOR IV PUSH (FOR BLOOD PRESSURE SUPPORT)
PREFILLED_SYRINGE | INTRAVENOUS | Status: AC
  Filled 2021-10-14: qty 20

## 2021-10-14 MED ORDER — HYDROMORPHONE HCL 1 MG/ML IJ SOLN
0.5000 mg | INTRAMUSCULAR | Status: AC | PRN
  Administered 2021-10-14 (×2): 0.5 mg via INTRAVENOUS

## 2021-10-14 MED ORDER — PROPOFOL 500 MG/50ML IV EMUL
INTRAVENOUS | Status: DC | PRN
  Administered 2021-10-14: 155 ug/kg/min via INTRAVENOUS

## 2021-10-14 MED ORDER — FENTANYL CITRATE (PF) 100 MCG/2ML IJ SOLN
25.0000 ug | INTRAMUSCULAR | Status: DC | PRN
  Administered 2021-10-14 (×2): 50 ug via INTRAVENOUS

## 2021-10-14 MED ORDER — FAMOTIDINE 20 MG PO TABS: 20.0000 mg | ORAL_TABLET | Freq: Once | ORAL | Status: AC

## 2021-10-14 MED ORDER — ONDANSETRON HCL 4 MG/2ML IJ SOLN
INTRAMUSCULAR | Status: AC
  Filled 2021-10-14: qty 4

## 2021-10-14 MED ORDER — ACETAMINOPHEN 10 MG/ML IV SOLN
INTRAVENOUS | Status: DC | PRN
  Administered 2021-10-14: 1000 mg via INTRAVENOUS

## 2021-10-14 MED ORDER — SURGIFLO WITH THROMBIN (HEMOSTATIC MATRIX KIT) OPTIME
TOPICAL | Status: DC | PRN
  Administered 2021-10-14: 1 via TOPICAL

## 2021-10-14 MED ORDER — ACETAMINOPHEN 10 MG/ML IV SOLN: 1000.0000 mg | Freq: Once | INTRAVENOUS | Status: DC | PRN

## 2021-10-14 MED ORDER — CEFAZOLIN IN SODIUM CHLORIDE 2-0.9 GM/100ML-% IV SOLN
2.0000 g | Freq: Once | INTRAVENOUS | Status: AC
  Administered 2021-10-14: 2 g via INTRAVENOUS
  Filled 2021-10-14 (×2): qty 100

## 2021-10-14 MED ORDER — REMIFENTANIL HCL 1 MG IV SOLR
INTRAVENOUS | Status: AC
  Filled 2021-10-14: qty 1000

## 2021-10-14 MED ORDER — CEFAZOLIN SODIUM-DEXTROSE 2-4 GM/100ML-% IV SOLN
INTRAVENOUS | Status: AC
  Filled 2021-10-14: qty 100

## 2021-10-14 MED ORDER — DEXMEDETOMIDINE HCL IN NACL 200 MCG/50ML IV SOLN
INTRAVENOUS | Status: DC | PRN
  Administered 2021-10-14: 12 ug via INTRAVENOUS

## 2021-10-14 MED ORDER — MIDAZOLAM HCL 2 MG/2ML IJ SOLN
INTRAMUSCULAR | Status: DC | PRN
  Administered 2021-10-14: 2 mg via INTRAVENOUS

## 2021-10-14 MED ORDER — GABAPENTIN 300 MG PO CAPS
ORAL_CAPSULE | ORAL | Status: AC
  Filled 2021-10-14: qty 1

## 2021-10-14 MED ORDER — PROPOFOL 10 MG/ML IV BOLUS
INTRAVENOUS | Status: DC | PRN
  Administered 2021-10-14: 150 mg via INTRAVENOUS

## 2021-10-14 SURGICAL SUPPLY — 55 items
ADH SKN CLS APL DERMABOND .7 (GAUZE/BANDAGES/DRESSINGS) ×1
AGENT HMST KT MTR STRL THRMB (HEMOSTASIS) ×1
APL PRP STRL LF DISP 70% ISPRP (MISCELLANEOUS) ×1
BUR NEURO DRILL SOFT 3.0X3.8M (BURR) ×2 IMPLANT
CHLORAPREP W/TINT 26 (MISCELLANEOUS) ×3 IMPLANT
COUNTER NEEDLE 20/40 LG (NEEDLE) ×2 IMPLANT
DERMABOND ADVANCED (GAUZE/BANDAGES/DRESSINGS) ×1
DERMABOND ADVANCED .7 DNX12 (GAUZE/BANDAGES/DRESSINGS) ×1 IMPLANT
DISC MOBI-C CERVICAL 13X15 H5 (Miscellaneous) ×2 IMPLANT
DRAPE C ARM PK CFD 31 SPINE (DRAPES) ×2 IMPLANT
DRAPE LAPAROTOMY 77X122 PED (DRAPES) ×2 IMPLANT
DRAPE MICROSCOPE SPINE 48X150 (DRAPES) ×2 IMPLANT
DRAPE SURG 17X11 SM STRL (DRAPES) ×5 IMPLANT
ELECT CAUTERY BLADE TIP 2.5 (TIP) ×2
ELECT REM PT RETURN 9FT ADLT (ELECTROSURGICAL) ×2
ELECTRODE CAUTERY BLDE TIP 2.5 (TIP) ×1 IMPLANT
ELECTRODE REM PT RTRN 9FT ADLT (ELECTROSURGICAL) ×1 IMPLANT
FEE INTRAOP CADWELL SUPPLY NCS (MISCELLANEOUS) IMPLANT
FEE INTRAOP MONITOR IMPULS NCS (MISCELLANEOUS) IMPLANT
GLOVE BIOGEL PI IND STRL 6.5 (GLOVE) ×1 IMPLANT
GLOVE BIOGEL PI IND STRL 8.5 (GLOVE) ×1 IMPLANT
GLOVE BIOGEL PI INDICATOR 6.5 (GLOVE) ×1
GLOVE BIOGEL PI INDICATOR 8.5 (GLOVE) ×1
GLOVE SURG SYN 6.5 ES PF (GLOVE) ×2 IMPLANT
GLOVE SURG SYN 6.5 PF PI (GLOVE) ×1 IMPLANT
GLOVE SURG SYN 8.5  E (GLOVE) ×3
GLOVE SURG SYN 8.5 E (GLOVE) ×3 IMPLANT
GLOVE SURG SYN 8.5 PF PI (GLOVE) ×3 IMPLANT
GOWN SRG LRG LVL 4 IMPRV REINF (GOWNS) ×1 IMPLANT
GOWN SRG XL LVL 3 NONREINFORCE (GOWNS) ×1 IMPLANT
GOWN STRL NON-REIN TWL XL LVL3 (GOWNS) ×2
GOWN STRL REIN LRG LVL4 (GOWNS) ×2
GRADUATE 1200CC STRL 31836 (MISCELLANEOUS) ×1 IMPLANT
INTRAOP CADWELL SUPPLY FEE NCS (MISCELLANEOUS) ×1
INTRAOP DISP SUPPLY FEE NCS (MISCELLANEOUS) ×2
INTRAOP MONITOR FEE IMPULS NCS (MISCELLANEOUS) ×1
INTRAOP MONITOR FEE IMPULSE (MISCELLANEOUS) ×1
KIT TURNOVER KIT A (KITS) ×2 IMPLANT
MANIFOLD NEPTUNE II (INSTRUMENTS) ×2 IMPLANT
MARKER SKIN DUAL TIP RULER LAB (MISCELLANEOUS) ×3 IMPLANT
NDL SAFETY ECLIPSE 18X1.5 (NEEDLE) IMPLANT
NEEDLE HYPO 18GX1.5 SHARP (NEEDLE)
NS IRRIG 1000ML POUR BTL (IV SOLUTION) ×2 IMPLANT
PACK LAMINECTOMY NEURO (CUSTOM PROCEDURE TRAY) ×2 IMPLANT
PAD ARMBOARD 7.5X6 YLW CONV (MISCELLANEOUS) ×2 IMPLANT
PIN CASPAR SPINAL 12MM (PIN) ×1 IMPLANT
SPONGE KITTNER 5P (MISCELLANEOUS) ×2 IMPLANT
STAPLER SKIN PROX 35W (STAPLE) ×1 IMPLANT
SURGIFLO W/THROMBIN 8M KIT (HEMOSTASIS) ×2 IMPLANT
SUT VIC AB 3-0 SH 8-18 (SUTURE) ×2 IMPLANT
SYR 30ML LL (SYRINGE) ×2 IMPLANT
TAPE CLOTH 3X10 WHT NS LF (GAUZE/BANDAGES/DRESSINGS) ×2 IMPLANT
TOWEL OR 17X26 4PK STRL BLUE (TOWEL DISPOSABLE) ×4 IMPLANT
TRAY FOLEY MTR SLVR 16FR STAT (SET/KITS/TRAYS/PACK) IMPLANT
TUBING CONNECTING 10 (TUBING) ×1 IMPLANT

## 2021-10-14 NOTE — H&P (Signed)
Referring Physician:  No referring provider defined for this encounter.  Primary Physician:  Alm Bustard, NP  History of Present Illness: 10/14/2021 Gloria [name] is here today with a chief complaint of the symptoms noted below.  09/10/2021 Gloria [name] is here today with a chief complaint of Pain in her neck pain that radiates down her right arm, forearm, and all fingers of her right hand. She also complains of difficulty swallowing, changes in handwriting, and occasional left arm pain that radiates to her forearm.  She began having symptoms approximately 1 year ago. She has sharp, dull, throbbing pain with numbness and tingling that is as bad as 10 out of 10 in her right arm. Cervical rotation and lifting make it worse. Rest and heat make it better.  Bowel/Bladder Dysfunction: none  Conservative measures:  Physical therapy: has participated 06/10/21-09/02/21 (scheduled for another visit on 09/11/21) Multimodal medical therapy including regular antiinflammatories: methocarbamol, celebrex, gabapentin, prednisone, tylenol, and Fioricet for headaches  Injections: has received epidural steroid injections 08/22/21: right C5-6 TFESI by Dr Yves Dill 07/31/21: right C4-5 TFESI by Dr Yves Dill (20% relief)  Past Surgery: denies  WEDNESDAY ERICSSON has symptoms of cervical myelopathy.  The symptoms are causing a significant impact on the patient's life.   HPI by Manning Charity, PA-C: 08/13/21: Gloria [name] is a 41 y.o. female who presents with the chief complaint of continued neck pain and right radiating arm pain. She states that her right arm pain has returned. She did undergo an injection on 08/01/2018 through Dr. Yves Dill which did help with some of the tingling in her proximal right arm. She does also continue to have right facial and ear pain with associated chest wall pain. She was started on gabapentin by physical medicine rehab which has helped some with her headaches. She continues  to smoke daily. She underwent an EMG on 08/07/2021 with the results pending.   06/27/21: Gloria [name] is a 41 y.o who is here today for further evaluation of her cervical spine. She states she started to experience feeling as if there is something stuck in her throat in late 2020 as well as some right-sided facial and chest pain. She was seen by ENT and underwent carotid artery imaging and thyroid study which were largely negative. She states that she was cleared by ENT however her symptoms continued and have progressed since to include numbness and tingling that radiates down her arms worse on the right than the left as well as right shoulder and interscapular pain worse with laying on her right side. She was recently hospitalized for pyelonephritis and sepsis and states that since then she has had resolution of her tingling in her arms however she feels heaviness in her arms bilaterally and generalized fatigue. She also notes history of low back pain although she states this is largely tolerable at this time. She did undergo some physical therapy and states that this provided her with significant relief but she was encouraged to hold off on physical therapy until her neurosurgical evaluation. At present she also endorses generalized headaches which seem to be worse in her neck pain is worse. She is currently taking Fioricet, Tylenol, and prednisone. She states she does get relief from the prednisone however as she starts to taper off of this her symptoms return. She is very concerned and frustrated as this is significantly impacting her quality of life. Of note, Gloria [name] is a current every day smoker  The symptoms  are causing a significant impact on the patient's life.   Review of Systems:  A 10 point review of systems is negative, except for the pertinent positives and negatives detailed in the HPI.  Past Medical History: Past Medical History:  Diagnosis Date   Acute pyelonephritis 06/16/2021    Anemia    Arthritis    Headache    Peripheral neuropathy    right arm and shoulder   Pregnancy induced hypertension    Preterm labor    Vaginal Pap smear, abnormal     Past Surgical History: Past Surgical History:  Procedure Laterality Date   REPAIR VAGINAL CUFF     after hysterectomy   TUBAL LIGATION Bilateral 12/30/2018   Procedure: POST PARTUM TUBAL LIGATION;  Surgeon: Ward, Elenora Fender, MD;  Location: [facility] OR[facility];  Service: Gynecology;  Laterality: Bilateral;   VAGINAL HYSTERECTOMY N/A 03/04/2021   Procedure: HYSTERECTOMY VAGINAL;  Surgeon: [name], MD;  Location: [facility] OR[facility];  Service: Gynecology;  Laterality: N/A;    Allergies: Allergies as of 09/27/2021   (No Known Allergies)    Medications: Current Meds  Medication Sig   gabapentin (NEURONTIN) 400 MG capsule Take 400 mg by mouth 3 (three) times daily.   methocarbamol (ROBAXIN) 500 MG tablet Take 500 mg by mouth 3 (three) times daily as needed for muscle spasms.   nortriptyline (PAMELOR) 10 MG capsule Take 30 mg by mouth at bedtime.    Social History: Social History   Tobacco Use   Smoking status: Some Days    Packs/day: 1.00    Types: Cigarettes   Smokeless tobacco: Never  Vaping Use   Vaping Use: Never used  Substance Use Topics   Alcohol use: Yes    Alcohol/week: 2.0 standard drinks of alcohol    Types: 1 Glasses of wine, 1 Cans of beer per week    Comment: occasionally   Drug use: No    Family Medical History: Family History  Problem Relation Age of Onset   Diabetes Mother    Arthritis Father    Cancer Maternal Grandfather    Diabetes Paternal Grandmother    Arthritis Paternal Grandmother     Physical Examination: Vitals:   10/14/21 0622  BP: (!) 139/91  Pulse: (!) 103  Resp: 16  Temp: 97.7 F (36.5 C)  SpO2: 99%   Heart sounds normal no MRG. Chest Clear to Auscultation Bilaterally.   General: Patient is well developed, well nourished, calm, collected, and in no apparent distress.  Attention to examination is appropriate.  Neck:   Supple.  Full range of motion.  Respiratory: Patient is breathing without any difficulty.   NEUROLOGICAL:     Awake, alert, oriented to person, place, and time.  Speech is clear and fluent. Fund of knowledge is appropriate.   Cranial Nerves: Pupils equal round and reactive to light.  Facial tone is symmetric.  Facial sensation is symmetric. Shoulder shrug is symmetric. Tongue protrusion is midline.  There is no pronator drift.  ROM of spine: full.    Strength: Side Biceps Triceps Deltoid Interossei Grip Wrist Ext. Wrist Flex.  R 4+ 5 4+ 5 5 5 5   L 5 5 5 5 5 5 5    Side Iliopsoas Quads Hamstring PF DF EHL  R 5 5 5 5 5 5   L 5 5 5 5 5 5    Reflexes are 1+ and symmetric at the biceps, triceps, brachioradialis, patella and achilles.   Hoffman's is present.  Clonus is not present.  Toes  are down-going.  Bilateral upper and lower extremity sensation is intact to light touch with exception of R C6 distribution.    No evidence of dysmetria noted.  Gait is normal.    Medical Decision Making  Imaging: MRI C spine 06/17/21 IMPRESSION:  1. At C4-C5, moderate to severe right and moderate left foraminal  stenosis, moderate canal stenosis, and deformity of the cord.  2. At C5-C6, moderate right greater than left foraminal stenosis and  mild to moderate canal stenosis.  3. At C6-C7, mild left foraminal stenosis and mild canal stenosis.   Electronically Signed  By: Feliberto Harts M.D.  On: 06/17/2021 16:17    I have personally reviewed the images and agree with the above interpretation.  Assessment and Plan: Ms. Ardelean is a pleasant 41 y.o. female with cervical myelopathy and radiculopathy. She has tried and failed conservative management including physical therapy, injections, medications. She continues to have predominantly radicular symptoms down her right arm. At this point, no further conservative management is indicated as she has  tried and failed conservative management over the past several months.  She has preserved mobility and disc height greater than 3 mm. As such, I have recommended C4-6 cervical disc arthroplasty.    Tristian Sickinger K. Myer Haff MD, [facility]

## 2021-10-14 NOTE — Anesthesia Preprocedure Evaluation (Addendum)
Anesthesia Evaluation  Patient identified by MRN, date of birth, ID band Patient awake    Reviewed: Allergy & Precautions, NPO status , Patient's Chart, lab work & pertinent test results  Airway Mallampati: II  TM Distance: >3 FB Neck ROM: full    Dental  (+) Chipped,    Pulmonary Current SmokerPatient did not abstain from smoking.,    Pulmonary exam normal        Cardiovascular Exercise Tolerance: Good Normal cardiovascular exam     Neuro/Psych Cervical myelopathy with cervical radiculopathy  Neuromuscular disease negative psych ROS   GI/Hepatic negative GI ROS, Neg liver ROS,   Endo/Other  negative endocrine ROS  Renal/GU      Musculoskeletal   Abdominal Normal abdominal exam  (+)   Peds  Hematology negative hematology ROS (+)   Anesthesia Other Findings Past Medical History: 06/16/2021: Acute pyelonephritis No date: Anemia No date: Arthritis No date: Headache No date: Peripheral neuropathy     Comment:  right arm and shoulder No date: Pregnancy induced hypertension No date: Preterm labor No date: Vaginal Pap smear, abnormal  Past Surgical History: No date: REPAIR VAGINAL CUFF     Comment:  after hysterectomy 12/30/2018: TUBAL LIGATION; Bilateral     Comment:  Procedure: POST PARTUM TUBAL LIGATION;  Surgeon: Ward,               Elenora Fender, MD;  Location: [facility] OR[facility];  Service: Gynecology;              Laterality: Bilateral; 03/04/2021: VAGINAL HYSTERECTOMY; N/A     Comment:  Procedure: HYSTERECTOMY VAGINAL;  Surgeon: [name], MD;  Location: [facility] OR[facility];  Service: Gynecology;                Laterality: N/A;  BMI    Body Mass Index: 25.69 kg/m      Reproductive/Obstetrics negative OB ROS                            Anesthesia Physical Anesthesia Plan  ASA: 2  Anesthesia Plan: General ETT   Post-op Pain Management: Toradol IV (intra-op)* and Ofirmev IV  (intra-op)*   Induction: Intravenous  PONV Risk Score and Plan: 3 and Ondansetron, Dexamethasone, Midazolam and Treatment may vary due to age or medical condition  Airway Management Planned: Oral ETT  Additional Equipment:   Intra-op Plan:   Post-operative Plan: Extubation in OR  Informed Consent: I have reviewed the patients History and Physical, chart, labs and discussed the procedure including the risks, benefits and alternatives for the proposed anesthesia with the patient or authorized representative who has indicated his/her understanding and acceptance.     Dental Advisory Given  Plan Discussed with: Anesthesiologist, CRNA and Surgeon  Anesthesia Plan Comments:         Anesthesia Quick Evaluation

## 2021-10-14 NOTE — Op Note (Addendum)
Indications: Gloria [name] is a 41 yo female who presented with  g95.9 cervical myelopathy, m54.12 cervical radiculopathy. Due to worsening symptoms, surgery was recommended.  Findings: stenosis  Preoperative Diagnosis: g95.9 cervical myelopathy, m54.12 cervical radiculopathy Postoperative Diagnosis: same   EBL: 25 ml IVF: 1200 ml Drains: none Disposition: Extubated and Stable to PACU Complications: none  No foley catheter was placed.   Preoperative Note:   Risks of surgery discussed include: infection, bleeding, stroke, coma, death, paralysis, CSF leak, nerve/spinal cord injury, numbness, tingling, weakness, complex regional pain syndrome, recurrent stenosis and/or disc herniation, vascular injury, development of instability, neck/back pain, need for further surgery, persistent symptoms, development of deformity, and the risks of anesthesia. The patient understood these risks and agreed to proceed.  Operative Note:   Operative Note:  Procedure:  1) Cervical Disc Arthroplasty at C4/5 and C5/6 using a LDR Mobi-C device   Procedure: After obtaining informed consent, the patient taken to the operating room, placed in supine position, general anesthesia induced.  The patient had a small shoulder roll placed behind the neck.  The patient received preop antibiotics and IV Decadron.  The patient had a neck incision outlined, was prepped and draped in usual sterile fashion. The incision was injected with local anesthetic.   An incision was opened, dissection taken down medial to the carotid artery and jugular vein, lateral to the trachea and esophagus.  The prevertebral fascia identified and a localizing x-ray demonstrated the correct level.  The longus colli were dissected laterally, and self-retaining retractors placed to open the operative field. The microscope was then brought into the field.  With this complete, distractor pins were placed in the vertebral bodies of C4 and C6. The distractor  was placed, then the annulus at C4/5 was opened using a bovie.  Curettes and pituitary rongeurs used to remove the majority of disk, then the drill was used to remove the posterior osteophyte and begin the foraminotomies. The nerve hook was used to elevate the posterior longitudinal ligament, which was then removed with Kerrison rongeurs. The microblunt nerve hook could be passed out the foramen bilateral at each level.   Meticulous hemostasis was obtained.  A trial spacer was used to size the disc space. Using flouroscopic guidance, a 15 mm width x 13 mm depth x 5 mm height Mobi-C was then inserted in the prepared disc space.  Then, the annulus at C5/6 was opened using a bovie.  Curettes and pituitary rongeurs used to remove the majority of disk, then the drill was used to remove the posterior osteophyte and begin the foraminotomies. The nerve hook was used to elevate the posterior longitudinal ligament, which was then removed with Kerrison rongeurs. The microblunt nerve hook could be passed out the foramen bilateral at each level.   Meticulous hemostasis was obtained.  A trial spacer was used to size the disc space. Using flouroscopic guidance, a 15 mm width x 13 mm depth x 5 mm height Mobi-C was then inserted in the prepared disc space.  The caspar distractor was removed, and bone wax used for hemostasis. Final AP and lateral radiographs were taken.   With the disc arthroplasties in good position, the wound was irrigated copiously and meticulous hemostasis obtained.  Wound was closed in 2 layers using interrupted inverted 3-0 Vicryl sutures.  The wound was dressed with dermabond, the head of bed at 30 degrees, taken to recovery room in stable condition.  No new postop neurological deficits were identified.  Sponge and pattie counts  were correct at the end of the procedure. Monitoring was stable throughout.  I performed the entire procedure with the assistance of Manning Charity PA as an Designer, television/film set.  An assistant was required for this procedure due to the complexity.  The assistant provided assistance in tissue manipulation and suction, and was required for the successful and safe performance of the procedure. I performed the critical portions of the procedure.   Venetia Night MD

## 2021-10-14 NOTE — Transfer of Care (Signed)
Immediate Anesthesia Transfer of Care Note  Patient: Gloria [name]  Procedure(s) Performed: C4-6 ARTHROPLASTY (Spine Cervical)  Patient Location: PACU  Anesthesia Type:General  Level of Consciousness: awake, drowsy and patient cooperative  Airway & Oxygen Therapy: Patient Spontanous Breathing and Patient connected to face mask oxygen  Post-op Assessment: Report given to RN and Post -op Vital signs reviewed and stable  Post vital signs: Reviewed and stable  Last Vitals:  Vitals Value Taken Time  BP 109/55 10/14/21 0930  Temp 36.3 C 10/14/21 0930  Pulse 111 10/14/21 0941  Resp 18 10/14/21 0941  SpO2 100 % 10/14/21 0941  Vitals shown include unvalidated device data.  Last Pain:  Vitals:   10/14/21 0622  TempSrc: Temporal  PainSc: 6          Complications: No notable events documented.

## 2021-10-14 NOTE — Anesthesia Postprocedure Evaluation (Signed)
Anesthesia Post Note  Patient: CYNTIA STALEY  Procedure(s) Performed: C4-6 ARTHROPLASTY (Spine Cervical)  Patient location during evaluation: PACU Anesthesia Type: General Level of consciousness: awake and alert Pain management: pain level controlled Vital Signs Assessment: post-procedure vital signs reviewed and stable Respiratory status: spontaneous breathing, nonlabored ventilation and respiratory function stable Cardiovascular status: blood pressure returned to baseline and stable Postop Assessment: no apparent nausea or vomiting Anesthetic complications: no   No notable events documented.   Last Vitals:  Vitals:   10/14/21 1229 10/14/21 1319  BP: 132/89 136/87  Pulse: 74 76  Resp: 16 18  Temp: 36.6 C 37 C  SpO2: 98% 96%    Last Pain:  Vitals:   10/14/21 1319  TempSrc: Oral  PainSc: 5                  Foye Deer

## 2021-10-14 NOTE — Anesthesia Procedure Notes (Signed)
Procedure Name: Intubation Date/Time: 10/14/2021 7:29 AM  Performed by: Mohammed Kindle, CRNAPre-anesthesia Checklist: Patient identified, Emergency Drugs available, Suction available and Patient being monitored Patient Re-evaluated:Patient Re-evaluated prior to induction Oxygen Delivery Method: Circle system utilized Preoxygenation: Pre-oxygenation with 100% oxygen Induction Type: IV induction Ventilation: Mask ventilation without difficulty Laryngoscope Size: McGraph and 3 Grade View: Grade I Tube type: Oral Tube size: 6.5 mm Number of attempts: 1 Airway Equipment and Method: Stylet and Oral airway Placement Confirmation: ETT inserted through vocal cords under direct vision, positive ETCO2, breath sounds checked- equal and bilateral and CO2 detector Secured at: 21 cm Tube secured with: Tape Dental Injury: Teeth and Oropharynx as per pre-operative assessment

## 2021-10-14 NOTE — Discharge Summary (Signed)
Physician Discharge Summary  Patient ID: Gloria [name] MRN: [id number] DOB/AGE: 40/13/1982 40 y.o.  Admit date: 10/14/2021 Discharge date: 10/14/2021  Admission Diagnoses: Cervical radiulopathy with myelopathy  Discharge Diagnoses:  Active Problems:   * No active hospital problems. *   Discharged Condition: good  Hospital Course:  Gloria [name] is a 41 y.o s/p C406 arthroplasty. Her intraoperative course was uncomplicated. She was monitored post-op for 4 hours and discharged home after ambulating, urinating, and tolerating PO intake. She was given a prescription for Percocet and instructed to continue Voltaren and Robaxin  Consults: None  Significant Diagnostic Studies: none  Treatments: surgery: as above. Please see separate operative report for further details  Discharge Exam: Blood pressure (!) 139/91, pulse (!) 103, temperature 97.7 F (36.5 C), temperature source Temporal, resp. rate 16, height 5\' 3"  (1.6 m), weight 65.8 kg, SpO2 99 %, unknown if currently breastfeeding. CN II-XII grossly intact 5/5 throughout Incision c/d/i  Disposition: Discharge disposition: 01-Home or Self Care        Allergies as of 10/14/2021   No Known Allergies      Medication List     TAKE these medications    butalbital-aspirin-caffeine 50-325-40 MG capsule Commonly known as: FIORINAL Take 1 capsule by mouth every 4 (four) hours as needed for headache.   clobetasol ointment 0.05 % Commonly known as: TEMOVATE Apply 1 application topically daily as needed for irritation.   diclofenac 75 MG EC tablet Commonly known as: VOLTAREN Take 75 mg by mouth daily.   gabapentin 400 MG capsule Commonly known as: NEURONTIN Take 400 mg by mouth 3 (three) times daily.   lidocaine 5 % Commonly known as: Lidoderm Place 1 patch onto the skin every 12 (twelve) hours. Remove & Discard patch within 12 hours or as directed by MD   methocarbamol 500 MG tablet Commonly known as: ROBAXIN Take  500 mg by mouth 3 (three) times daily as needed for muscle spasms.   nortriptyline 10 MG capsule Commonly known as: PAMELOR Take 30 mg by mouth at bedtime.   oxyCODONE-acetaminophen 5-325 MG tablet Commonly known as: Percocet Take 1 tablet by mouth every 4 (four) hours as needed for up to 5 days for severe pain.        Follow-up Information     10/16/2021, PA Follow up in 2 week(s).   Specialty: Neurosurgery Why: for post-op and incision  check Contact information: [address] [place] [phone]                 Signed: [phone] 10/14/2021, 9:23 AM

## 2021-10-14 NOTE — Discharge Instructions (Addendum)
Your surgeon has performed an operation on your cervical spine (neck) to relieve pressure on the spinal cord and/or nerves. This involved making an incision in the front of your neck and removing one or more of the discs that support your spine. Next, a small piece of bone, a titanium plate, and screws were used to fuse two or more of the vertebrae (bones) together.  The following are instructions to help in your recovery once you have been discharged from the hospital. Even if you feel well, it is important that you follow these activity guidelines. If you do not let your neck heal properly from the surgery, you can increase the chance of return of your symptoms and other complications.  Activity    No bending, lifting, or twisting ("BLT"). Avoid lifting objects heavier than 10 pounds (gallon milk jug).  Where possible, avoid household activities that involve lifting, bending, reaching, pushing, or pulling such as laundry, vacuuming, grocery shopping, and childcare. Try to arrange for help from friends and family for these activities while your back heals.  Increase physical activity slowly as tolerated.  Taking short walks is encouraged, but avoid strenuous exercise. Do not jog, run, bicycle, lift weights, or participate in any other exercises unless specifically allowed by your doctor.  Talk to your doctor before resuming sexual activity.  You should not drive until cleared by your doctor.  Until released by your doctor, you should not return to work or school.  You should rest at home and let your body heal.   You may shower three days after your surgery.  After showering, lightly dab your incision dry. Do not take a tub bath or go swimming until approved by your doctor at your follow-up appointment.  If your doctor ordered a cervical collar (neck brace) for you, you should wear it whenever you are out of bed. You may remove it when lying down or sleeping, but you should wear it at all other  times. Not all neck surgeries require a cervical collar.  If you smoke, we strongly recommend that you quit.  Smoking has been proven to interfere with normal bone healing and will dramatically reduce the success rate of your surgery. Please contact [facility] (800-QUIT-NOW) and use the resources at www.[facility].com for assistance in stopping smoking.  Surgical Incision   Keep your incision area clean and dry.  Your incision was closed with Dermabond glue. The lue should begin to peel away within about a week.  Diet           You may return to your usual diet. However, you may experience discomfort when swallowing in the first month after your surgery. This is normal. You may find that softer foods are more comfortable for you to swallow. Be sure to stay hydrated.  When to Contact us  You may experience pain in your neck and/or pain between your shoulder blades. This is normal and should improve in the next few weeks with the help of pain medication, muscle relaxers, and rest. Some patients report that a warm compress on the back of the neck or between the shoulder blades helps.  However, should you experience any of the following, contact us immediately: New numbness or weakness Pain that is progressively getting worse, and is not relieved by your pain medication, muscle relaxers, rest, and warm compresses Bleeding, redness, swelling, pain, or drainage from surgical incision Chills or flu-like symptoms Fever greater than 101.0 F (38.3 C) Inability to eat, drink fluids, or take  medications Problems with bowel or bladder functions Difficulty breathing or shortness of breath Warmth, tenderness, or swelling in your calf Contact Information During office hours (Monday-Friday 9 am to 5 pm), please call your physician at [phone] and ask for Sharlot Gowda After hours and weekends, please call [phone] and speak with the answering service, who will contact the doctor on call.  If  that fails, call the [facility] Operator at [phone] and ask for the Neurosurgery Resident On Call  For a life-threatening emergency, call 911   AMBULATORY SURGERY  DISCHARGE INSTRUCTIONS   The drugs that you were given will stay in your system until tomorrow so for the next 24 hours you should not:  Drive an automobile Make any legal decisions Drink any alcoholic beverage   You may resume regular meals tomorrow.  Today it is better to start with liquids and gradually work up to solid foods.  You may eat anything you prefer, but it is better to start with liquids, then soup and crackers, and gradually work up to solid foods.   Please notify your doctor immediately if you have any unusual bleeding, trouble breathing, redness and pain at the surgery site, drainage, fever, or pain not relieved by medication.    Additional Instructions:        Please contact your physician with any problems or Same Day Surgery at [phone], Monday through Friday 6 am to 4 pm, or [place] at [facility] number at [phone].

## 2021-10-15 ENCOUNTER — Telehealth: Payer: Self-pay

## 2021-10-15 ENCOUNTER — Encounter: Payer: Self-pay | Admitting: Neurosurgery

## 2021-10-15 NOTE — Telephone Encounter (Signed)
-----   Message from Rockey Situ sent at 10/15/2021  9:09 AM EDT ----- Regarding: postop questions Contact: [phone]  C4-6 arthroplasty on 10/14/2021 1. Percocet 5/325mg  4 hours, is there anything else she can take with this medication to help the pain? 2. D/C summary note states to continue taking diclofenac 75mg , she stopped taking this medication 1 week prior to surgery. She only has 2 weeks left of medication. Should she finish the 75mg  dose and will you send in a refill? [facility] [address]

## 2021-10-24 ENCOUNTER — Telehealth: Payer: Self-pay

## 2021-10-24 NOTE — Telephone Encounter (Signed)
I spoke with Gloria [name] about this and she stated that this is not concerning and that she may be coming down with a virus and to see urgent care or her PCP. The lighting bolt sensation could be the nerve irritation from the surgery.    I spoke with the patient and told her what Gloria [name] had said. She verbalized understanding, she stated that she just wanted to make sure this was normal and this was not going to make anything worse.

## 2021-10-24 NOTE — Telephone Encounter (Signed)
Patient called and stated that today she has really not felt good. She has felt cold and hot and overall not well. She also said that the back of her neck feels very warm to the touch and when she has been walking today she has felt "lighting bolt" down her left arm. She is concerned about this.

## 2021-10-29 ENCOUNTER — Ambulatory Visit (INDEPENDENT_AMBULATORY_CARE_PROVIDER_SITE_OTHER): Payer: Medicaid Other | Admitting: Neurosurgery

## 2021-10-29 ENCOUNTER — Encounter: Payer: Self-pay | Admitting: Neurosurgery

## 2021-10-29 VITALS — BP 136/84 | Temp 98.9°F | Ht 63.0 in | Wt 143.0 lb

## 2021-10-29 DIAGNOSIS — Z9889 Other specified postprocedural states: Secondary | ICD-10-CM

## 2021-10-29 DIAGNOSIS — G959 Disease of spinal cord, unspecified: Secondary | ICD-10-CM

## 2021-10-29 DIAGNOSIS — M5412 Radiculopathy, cervical region: Secondary | ICD-10-CM

## 2021-10-29 DIAGNOSIS — Z09 Encounter for follow-up examination after completed treatment for conditions other than malignant neoplasm: Secondary | ICD-10-CM

## 2021-10-29 MED ORDER — OXYCODONE HCL 5 MG PO TABS: 5.0000 mg | ORAL_TABLET | Freq: Three times a day (TID) | ORAL | 0 refills | Status: AC | PRN

## 2021-10-29 MED ORDER — DICLOFENAC SODIUM 75 MG PO TBEC: 75.0000 mg | DELAYED_RELEASE_TABLET | Freq: Two times a day (BID) | ORAL | 0 refills | Status: DC | PRN

## 2021-10-29 NOTE — Progress Notes (Signed)
   REFERRING PHYSICIAN:  Marisue Ivan, Md [address] [place],  [place] [address]  DOS: 10/14/21 C4-6 arthroplasty   HISTORY OF PRESENT ILLNESS: Gloria [name] is approximately 2 weeks status post cervical arthroplasty. Overall, she is doing poorly post-operatively. She has continued to have neck pain with electric shock like pain down her left arm and is now having pain into her right shoulder which is new since surgery.  She also endorses shocklike pain down both of her arms particularly with walking which improves with rest.  She describes pain in between her shoulder blades that radiates down to her shoulders.  She states that she has had some intermittent right jaw pain which had improved initially after surgery but has since returned.  She also endorses some difficulty with swallowing which improves with her Diclofenac. She is currently taking Robaxin, Diclofenac, and Gabapentin.  She denies any incisional concerns.  PHYSICAL EXAMINATION:  NEUROLOGICAL:  General: In no acute distress.   Awake, alert, oriented to person, place, and time.  Pupils equal round and reactive to light.  Facial tone is symmetric.  Tongue protrusion is midline.  Strength: Side Biceps Triceps Deltoid Interossei Grip Wrist Ext. Wrist Flex.  R 5 5 5 5 5 5 5   L 5 5 5 5 5 5 5    Incision c/d/I and healing well.   Imaging:  No interval imaging to review   Assessment / Plan: Gloria [name] is doing poorly after cervical arthroplasty.  We discussed that some of her symptoms are not uncommon after surgery and should continue to improve with time.  I recommend that she take diclofenac twice a day as needed and will provide an additional prescription for this.  I will also give her a refill of oxycodone to take as needed.  We discussed activity escalation and I have advised the patient to lift up to 10 pounds until 6 weeks after surgery, then increase up to 25 pounds until 12 weeks after  surgery.  After 12 weeks post-op, the patient advised to increase activity as tolerated. she will return to clinic in approximately 4 weeks to see Dr. or sooner should she have any questions or concerns.  She expressed understanding was in agreement with this plan.  Efrain Sella PA-C Dept of Neurosurgery

## 2021-11-05 ENCOUNTER — Emergency Department
Admission: EM | Admit: 2021-11-05 | Discharge: 2021-11-05 | Disposition: A | Discharge status: Home / Self Care | Payer: Medicaid Other | Attending: Emergency Medicine | Admitting: Emergency Medicine

## 2021-11-05 ENCOUNTER — Encounter: Payer: Self-pay | Admitting: Intensive Care

## 2021-11-05 ENCOUNTER — Other Ambulatory Visit: Payer: Self-pay

## 2021-11-05 DIAGNOSIS — R519 Headache, unspecified: Secondary | ICD-10-CM | POA: Diagnosis present

## 2021-11-05 DIAGNOSIS — Z720 Tobacco use: Secondary | ICD-10-CM | POA: Insufficient documentation

## 2021-11-05 DIAGNOSIS — G43809 Other migraine, not intractable, without status migrainosus: Secondary | ICD-10-CM | POA: Insufficient documentation

## 2021-11-05 MED ORDER — KETOROLAC TROMETHAMINE 15 MG/ML IJ SOLN
15.0000 mg | Freq: Once | INTRAMUSCULAR | Status: AC
  Administered 2021-11-05: 15 mg via INTRAMUSCULAR
  Filled 2021-11-05: qty 1

## 2021-11-05 MED ORDER — DIPHENHYDRAMINE HCL 25 MG PO CAPS
50.0000 mg | ORAL_CAPSULE | Freq: Once | ORAL | Status: AC
Start: 2021-11-05 — End: 2021-11-05
  Administered 2021-11-05: 50 mg via ORAL
  Filled 2021-11-05: qty 2

## 2021-11-05 MED ORDER — PROCHLORPERAZINE EDISYLATE 10 MG/2ML IJ SOLN
10.0000 mg | Freq: Once | INTRAMUSCULAR | Status: AC
  Administered 2021-11-05: 10 mg via INTRAMUSCULAR
  Filled 2021-11-05: qty 2

## 2021-11-05 NOTE — ED Notes (Signed)
Pt and husband give verbal consent to Dc

## 2021-11-05 NOTE — ED Provider Triage Note (Signed)
  Emergency Medicine Provider Triage Evaluation Note  Gloria [name] , a 41 y.o.female,  was evaluated in triage.  Pt complains of headache x1 day.  Patient states that she has a history of migraines and feels like she is having one of her usual ones.  She is requesting a migraine cocktail like the one that she had last time she was here.  She recently had an MRI of her head performed, which was reportedly clear.   Review of Systems  Positive: Headache Negative: Denies fever, chest pain, vomiting  Physical Exam   Vitals:   11/05/21 1805  BP: (!) 135/91  Pulse: 95  Resp: 18  Temp: 98 F (36.7 C)  SpO2: 100%   Gen:   Awake, appears uncomfortable and in pain Resp:  Normal effort  MSK:   Moves extremities without difficulty  Other:  No gross neurological deficits  Medical Decision Making  Given the patient's initial medical screening exam, the following diagnostic evaluation has been ordered. The patient will be placed in the appropriate treatment space, once one is available, to complete the evaluation and treatment. I have discussed the plan of care with the patient and I have advised the patient that an ED physician or mid-level practitioner will reevaluate their condition after the test results have been received, as the results may give them additional insight into the type of treatment they may need.    Diagnostics: None immediately  Treatments: Ketorolac, Compazine, diphenhydramine   Varney Daily, [place] 11/05/21 1834

## 2021-11-05 NOTE — ED Provider Notes (Signed)
[facility] Provider Note    Event Date/Time   First MD Initiated Contact with Patient 11/05/21 1902     (approximate)   History   Chief Complaint Headache   HPI Gloria [name] is a 41 y.o. female, history of migraines, tobacco use, presents emergency department for evaluation of headache.  Patient states she woke up with a headache this morning, consistent with her previous migraines that she has had.  She tried some of her medications at home, however they have provided minimal relief.  Reports photophobia as well.  She states that she does not want any diagnostic tests, but would like the migraine cocktail that she was given the last time she was here for migraines.  Denies fever/chills, chest pain, shortness of breath, hearing changes, dizziness/vertigo, numbness/tingling upper or lower extremities, nausea/vomiting, or recent injuries/illnesses.  She is not concerned for pregnancy at this time, as she has had her tubes tied.  History Limitations: No limitations        Physical Exam  Triage Vital Signs: ED Triage Vitals  Enc Vitals Group     BP 11/05/21 1805 (!) 135/91     Pulse Rate 11/05/21 1805 95     Resp 11/05/21 1805 18     Temp 11/05/21 1805 98 F (36.7 C)     Temp Source 11/05/21 1805 Oral     SpO2 11/05/21 1805 100 %     Weight 11/05/21 1810 143 lb (64.9 kg)     Height 11/05/21 1810 5\' 3"  (1.6 m)     Head Circumference --      Peak Flow --      Pain Score 11/05/21 1809 10     Pain Loc --      Pain Edu? --      Excl. in GC? --     Most recent vital signs: Vitals:   11/05/21 1805  BP: (!) 135/91  Pulse: 95  Resp: 18  Temp: 98 F (36.7 C)  SpO2: 100%    General: Awake, NAD.  Skin: Warm, dry. No rashes or lesions.  Eyes: PERRL. Conjunctivae normal.  CV: Good peripheral perfusion.  Resp: Normal effort.  Abd: Soft, non-tender. No distention.  Neuro: At baseline. No gross neurological deficits.  Cranial nerves II through XII  intact.  Focused Exam: N/A.  Physical Exam    ED Results / Procedures / Treatments  Labs (all labs ordered are listed, but only abnormal results are displayed) Labs Reviewed - No data to display   EKG N/A.   RADIOLOGY  ED Provider Interpretation: N/A.  No results found.  PROCEDURES:  Critical Care performed: N/A.  Procedures    MEDICATIONS ORDERED IN ED: Medications  ketorolac (TORADOL) 15 MG/ML injection 15 mg (15 mg Intramuscular Given 11/05/21 1927)  diphenhydrAMINE (BENADRYL) capsule 50 mg (50 mg Oral Given 11/05/21 1927)  prochlorperazine (COMPAZINE) injection 10 mg (10 mg Intramuscular Given 11/05/21 1927)     IMPRESSION / MDM / ASSESSMENT AND PLAN / ED COURSE  I reviewed the triage vital signs and the nursing notes.                              Differential diagnosis includes, but is not limited to, tension headache, cluster headache, migraine headache.   ED Course Patient appears well, vitals within normal limits.  NAD.  We will go ahead and treat with ketorolac, diphenhydramine, and compazine.  Assessment/Plan Presentation  consistent with migraine headache.  No neurological deficits found on physical exam.  History is consistent with her previous migraine headaches.  Very low suspicion for serious or life-threatening pathology, including meningitis or subarachnoid hemorrhage given her presentation.  She responded well to ketorolac, diphenhydramine, compazine.  They are requesting to leave at this time.  I believe this is reasonable.  She has an appointment with neurology in 2 days.  Encouraged her to follow through with this appointment.  We will discharge  Provided the patient with anticipatory guidance, return precautions, and educational material. Encouraged the patient to return to the emergency department at any time if they begin to experience any new or worsening symptoms. Patient expressed understanding and agreed with the plan.   Patient's  presentation is most consistent with acute, uncomplicated illness.       FINAL CLINICAL IMPRESSION(S) / ED DIAGNOSES   Final diagnoses:  Other migraine without status migrainosus, not intractable     Rx / DC Orders   ED Discharge Orders     None        Note:  This document was prepared using Dragon voice recognition software and may include unintentional dictation errors.   Varney Daily, [place] 11/05/21 1941    Concha Se, MD 11/06/21 1340

## 2021-11-05 NOTE — Discharge Instructions (Addendum)
-  Please follow-up with your neurologist within the next couple days, as discussed.  -You may continue to take your over-the-counter medications as needed for recurrent headaches.  -Return to the emergency department anytime if you begin to experience any new or worsening symptoms.

## 2021-11-05 NOTE — ED Triage Notes (Signed)
Patient presents with headache that she awoke with this AM. No relief from medications at home. History of headaches and reports feels the same

## 2021-11-18 ENCOUNTER — Encounter: Payer: Self-pay | Admitting: Neurosurgery

## 2021-11-26 ENCOUNTER — Encounter: Payer: Self-pay | Admitting: Neurosurgery

## 2021-11-26 ENCOUNTER — Ambulatory Visit
Admission: RE | Admit: 2021-11-26 | Discharge: 2021-11-26 | Disposition: A | Discharge status: Home / Self Care | Payer: Medicaid Other | Attending: Neurosurgery | Admitting: Neurosurgery

## 2021-11-26 ENCOUNTER — Ambulatory Visit (INDEPENDENT_AMBULATORY_CARE_PROVIDER_SITE_OTHER): Payer: Medicaid Other | Admitting: Neurosurgery

## 2021-11-26 ENCOUNTER — Ambulatory Visit
Admission: RE | Admit: 2021-11-26 | Discharge: 2021-11-26 | Disposition: A | Discharge status: Home / Self Care | Payer: Medicaid Other | Source: Ambulatory Visit | Attending: Neurosurgery | Admitting: Neurosurgery

## 2021-11-26 VITALS — BP 128/80 | Temp 98.9°F | Ht 63.0 in | Wt 143.0 lb

## 2021-11-26 DIAGNOSIS — M5412 Radiculopathy, cervical region: Secondary | ICD-10-CM

## 2021-11-26 DIAGNOSIS — Z9889 Other specified postprocedural states: Secondary | ICD-10-CM

## 2021-11-26 DIAGNOSIS — G959 Disease of spinal cord, unspecified: Secondary | ICD-10-CM

## 2021-11-26 DIAGNOSIS — Z09 Encounter for follow-up examination after completed treatment for conditions other than malignant neoplasm: Secondary | ICD-10-CM

## 2021-11-26 MED ORDER — DICLOFENAC SODIUM 75 MG PO TBEC
75.0000 mg | DELAYED_RELEASE_TABLET | Freq: Two times a day (BID) | ORAL | 0 refills | Status: AC | PRN
Start: 2021-11-26 — End: 2021-12-26

## 2021-11-26 MED ORDER — METHOCARBAMOL 500 MG PO TABS: 500.0000 mg | ORAL_TABLET | Freq: Three times a day (TID) | ORAL | 0 refills | Status: DC | PRN

## 2021-11-26 NOTE — Progress Notes (Signed)
   REFERRING PHYSICIAN:  Venetia Night, Md [address] [place],  [place] [address]  DOS: 10/14/21 C4-6 arthroplasty   HISTORY OF PRESENT ILLNESS: Gloria [name] is status post cervical arthroplasty.   She has made some improvements since she was last seen.  She is still having some pain around her right shoulder and some numbness and hypersensitivity in her right forearm.  She reports that she gets some shocking pain when she turns her head.  She had some dysphagia prior to surgery, and has felt some issues with this since surgery though it is improving.   PHYSICAL EXAMINATION:  NEUROLOGICAL:  General: In no acute distress.   Awake, alert, oriented to person, place, and time.  Pupils equal round and reactive to light.  Facial tone is symmetric.  Tongue protrusion is midline.  Strength: Side Biceps Triceps Deltoid Interossei Grip Wrist Ext. Wrist Flex.  R 5 5 5 5 5 5 5   L 5 5 5 5 5 5 5    Incision c/d/I and healing well.   Imaging:  No interval imaging to review   Assessment / Plan: Gloria [name] is doing at her after cervical arthroplasty.  I will start her on exercises for her neck.  I refilled her diclofenac and methocarbamol, as that has been helping.  I expect that her dysphagia will continue to improve over the next couple of months.  If she is not doing better at her next appointment, we will review and determine whether shoulder evaluation and repeat imaging is indicated.  MD Dept of Neurosurgery

## 2022-01-06 ENCOUNTER — Other Ambulatory Visit: Payer: Self-pay

## 2022-01-06 DIAGNOSIS — Z9889 Other specified postprocedural states: Secondary | ICD-10-CM

## 2022-01-07 ENCOUNTER — Encounter: Payer: Self-pay | Admitting: Neurosurgery

## 2022-01-07 ENCOUNTER — Ambulatory Visit
Admission: RE | Admit: 2022-01-07 | Discharge: 2022-01-07 | Disposition: A | Discharge status: Home / Self Care | Payer: Medicaid Other | Attending: Neurosurgery | Admitting: Neurosurgery

## 2022-01-07 ENCOUNTER — Ambulatory Visit (INDEPENDENT_AMBULATORY_CARE_PROVIDER_SITE_OTHER): Payer: Medicaid Other | Admitting: Neurosurgery

## 2022-01-07 ENCOUNTER — Ambulatory Visit
Admission: RE | Admit: 2022-01-07 | Discharge: 2022-01-07 | Disposition: A | Discharge status: Home / Self Care | Payer: Medicaid Other | Source: Ambulatory Visit | Attending: Neurosurgery | Admitting: Neurosurgery

## 2022-01-07 VITALS — BP 126/78 | Ht 63.0 in | Wt 143.0 lb

## 2022-01-07 DIAGNOSIS — Z9889 Other specified postprocedural states: Secondary | ICD-10-CM | POA: Diagnosis present

## 2022-01-07 DIAGNOSIS — G8929 Other chronic pain: Secondary | ICD-10-CM | POA: Diagnosis not present

## 2022-01-07 DIAGNOSIS — M25511 Pain in right shoulder: Secondary | ICD-10-CM | POA: Diagnosis not present

## 2022-01-07 NOTE — Progress Notes (Signed)
   REFERRING PHYSICIAN:  Peggye Form, [address],  [place] [address]  DOS: 10/14/21 C4-6 arthroplasty   HISTORY OF PRESENT ILLNESS: Gloria [name] is status post cervical arthroplasty.  Her neck is doing much better.  She is still having pain around her right shoulder.  She prickly has trouble when she lifts her arm above her shoulder.     PHYSICAL EXAMINATION:  NEUROLOGICAL:  General: In no acute distress.   Awake, alert, oriented to person, place, and time.  Pupils equal round and reactive to light.  Facial tone is symmetric.  Tongue protrusion is midline.  Strength: Side Biceps Triceps Deltoid Interossei Grip Wrist Ext. Wrist Flex.  R 5 5 5 5 5 5 5   L 5 5 5 5 5 5 5    Incision healing well  She has pain with active range of motion of her right shoulder.  External rotation and abduction give her shoulder pain.  Imaging:  No complications noted  Assessment / Plan: Gloria [name] is doing better after her cervical arthroplasty.  Over the last few weeks, her neck has gotten better and better.  I am concerned that she has right shoulder pathology.  I would like to refer her to orthopedics at [facility] clinic for evaluation.  I will see her back in 3 months.  If she is doing well at that time, I have asked her to call and we will move her appointment back.    Meade Maw MD Dept of Neurosurgery

## 2022-04-09 ENCOUNTER — Other Ambulatory Visit: Payer: Self-pay

## 2022-04-09 DIAGNOSIS — Z9889 Other specified postprocedural states: Secondary | ICD-10-CM

## 2022-04-10 ENCOUNTER — Encounter: Payer: Self-pay | Admitting: Neurosurgery

## 2022-04-10 ENCOUNTER — Ambulatory Visit
Admission: RE | Admit: 2022-04-10 | Discharge: 2022-04-10 | Disposition: A | Discharge status: Home / Self Care | Payer: Medicaid Other | Source: Ambulatory Visit | Attending: Neurosurgery | Admitting: Neurosurgery

## 2022-04-10 ENCOUNTER — Ambulatory Visit
Admission: RE | Admit: 2022-04-10 | Discharge: 2022-04-10 | Disposition: A | Discharge status: Home / Self Care | Payer: Medicaid Other | Attending: Neurosurgery | Admitting: Neurosurgery

## 2022-04-10 ENCOUNTER — Ambulatory Visit (INDEPENDENT_AMBULATORY_CARE_PROVIDER_SITE_OTHER): Payer: Medicaid Other | Admitting: Neurosurgery

## 2022-04-10 VITALS — BP 167/105 | HR 92 | Wt 139.0 lb

## 2022-04-10 DIAGNOSIS — M542 Cervicalgia: Secondary | ICD-10-CM

## 2022-04-10 DIAGNOSIS — M5412 Radiculopathy, cervical region: Secondary | ICD-10-CM

## 2022-04-10 DIAGNOSIS — Z09 Encounter for follow-up examination after completed treatment for conditions other than malignant neoplasm: Secondary | ICD-10-CM

## 2022-04-10 DIAGNOSIS — Z9889 Other specified postprocedural states: Secondary | ICD-10-CM | POA: Insufficient documentation

## 2022-04-10 DIAGNOSIS — M5416 Radiculopathy, lumbar region: Secondary | ICD-10-CM

## 2022-04-10 NOTE — Progress Notes (Signed)
   REFERRING PHYSICIAN:  Peggye Form, [address] [place],  [place] [address]  DOS: 10/14/21 C4-6 arthroplasty   HISTORY OF PRESENT ILLNESS: Gloria [name] is status post cervical arthroplasty.   She is still having pain around her right shoulder.  She prickly has trouble when she lifts her arm above her shoulder.  She continues to have issues with swelling around her neck and into her shoulder.  She is tried dry needling but that did not help.  She is starting physical therapy next week.  She is having pain on the left arm.  She is also had pain into her rib cage and into her head.  She is having headaches.  She feels that she has weakness in her arms.  She is shaky in her arms.  She is also having issues with urinary urgency.     PHYSICAL EXAMINATION:  NEUROLOGICAL:  General: In no acute distress.   Awake, alert, oriented to person, place, and time.  Pupils equal round and reactive to light.  Facial tone is symmetric.  Tongue protrusion is midline.  Strength: Side Biceps Triceps Deltoid Interossei Grip Wrist Ext. Wrist Flex.  R 4+ 4+ 4 4+ 4 4+ 4+  L 4+ 4+ 4+ 4+ 4+ 4+ 4+   Incision healing well  She has pain with active range of motion of her right shoulder.  External rotation and abduction give her shoulder pain.  Imaging:  There is a rightward tilt of her C4-5 arthroplasty.  This has been relatively stable over several months.  Assessment / Plan: Gloria [name] is doing fair after her cervical arthroplasty.  She has a multitude of other issues that have come up over the last several months.  She has been referred to [facility] neurology for neuromuscular evaluation.  I agree with this.  I would think she needs reevaluation of her neck given the complaints and pain down her right arm.  She still has numbness in her hands.  I will send her for nerve conduction study and a cervical spine MRI scan.  I will be interested to see what comes up in her evaluation.  I would like to see  her back in 3 months with x-rays.     Meade Maw MD Dept of Neurosurgery

## 2022-04-18 ENCOUNTER — Other Ambulatory Visit: Payer: Self-pay | Admitting: Family Medicine

## 2022-04-18 ENCOUNTER — Ambulatory Visit
Admission: RE | Admit: 2022-04-18 | Discharge: 2022-04-18 | Disposition: A | Discharge status: Home / Self Care | Payer: Medicaid Other | Source: Ambulatory Visit | Attending: Family Medicine | Admitting: Family Medicine

## 2022-04-18 DIAGNOSIS — R319 Hematuria, unspecified: Secondary | ICD-10-CM | POA: Insufficient documentation

## 2022-04-18 DIAGNOSIS — R1011 Right upper quadrant pain: Secondary | ICD-10-CM

## 2022-04-18 DIAGNOSIS — R1031 Right lower quadrant pain: Secondary | ICD-10-CM | POA: Diagnosis present

## 2022-04-18 MED ORDER — IOHEXOL 300 MG/ML  SOLN: 100.0000 mL | Freq: Once | INTRAMUSCULAR | Status: DC | PRN

## 2022-04-18 MED ORDER — IOHEXOL 300 MG/ML  SOLN
100.0000 mL | Freq: Once | INTRAMUSCULAR | Status: AC | PRN
  Administered 2022-04-18: 100 mL via INTRAVENOUS

## 2022-04-23 ENCOUNTER — Ambulatory Visit
Admission: RE | Admit: 2022-04-23 | Discharge: 2022-04-23 | Disposition: A | Discharge status: Home / Self Care | Payer: Medicaid Other | Source: Ambulatory Visit | Attending: Neurosurgery | Admitting: Neurosurgery

## 2022-04-23 DIAGNOSIS — M542 Cervicalgia: Secondary | ICD-10-CM | POA: Insufficient documentation

## 2022-05-19 ENCOUNTER — Encounter: Payer: Self-pay | Admitting: Neurosurgery

## 2022-05-19 ENCOUNTER — Telehealth: Payer: Self-pay

## 2022-05-19 ENCOUNTER — Other Ambulatory Visit: Payer: Self-pay

## 2022-05-19 ENCOUNTER — Emergency Department: Payer: Medicaid Other

## 2022-05-19 ENCOUNTER — Encounter: Payer: Self-pay | Admitting: Emergency Medicine

## 2022-05-19 ENCOUNTER — Emergency Department
Admission: EM | Admit: 2022-05-19 | Discharge: 2022-05-19 | Disposition: A | Discharge status: Home / Self Care | Payer: Medicaid Other | Attending: Emergency Medicine | Admitting: Emergency Medicine

## 2022-05-19 DIAGNOSIS — M5126 Other intervertebral disc displacement, lumbar region: Secondary | ICD-10-CM | POA: Insufficient documentation

## 2022-05-19 DIAGNOSIS — R531 Weakness: Secondary | ICD-10-CM | POA: Insufficient documentation

## 2022-05-19 DIAGNOSIS — M545 Low back pain, unspecified: Secondary | ICD-10-CM | POA: Diagnosis not present

## 2022-05-19 DIAGNOSIS — W19XXXA Unspecified fall, initial encounter: Secondary | ICD-10-CM | POA: Insufficient documentation

## 2022-05-19 DIAGNOSIS — S1090XA Unspecified superficial injury of unspecified part of neck, initial encounter: Secondary | ICD-10-CM | POA: Diagnosis present

## 2022-05-19 DIAGNOSIS — S161XXA Strain of muscle, fascia and tendon at neck level, initial encounter: Secondary | ICD-10-CM | POA: Diagnosis not present

## 2022-05-19 MED ORDER — OXYCODONE-ACETAMINOPHEN 5-325 MG PO TABS
1.0000 | ORAL_TABLET | Freq: Once | ORAL | Status: AC
  Administered 2022-05-19: 1 via ORAL
  Filled 2022-05-19: qty 1

## 2022-05-19 MED ORDER — ACETAMINOPHEN 325 MG PO TABS: 650.0000 mg | ORAL_TABLET | Freq: Once | ORAL | Status: DC

## 2022-05-19 NOTE — ED Triage Notes (Signed)
Patient to ED for fall that occurred on Saturday. C/o of pain in neck, back and legs. Patient has c-spine surgery (C4 and C5) approx 1 year ago. Patient states she went to physical therapy today and they would not work with her due to fall and increased weakness in legs. Also, c/o headache. Denies hitting head or LOC with fall.   Patient placed in c-collar at this time.

## 2022-05-19 NOTE — ED Provider Notes (Signed)
[facility] Provider Note    Event Date/Time   First MD Initiated Contact with Patient 05/19/22 1652     (approximate)   History   Fall (/)   HPI  Gloria [name] is a 42 y.o. female with history of pyelonephritis, cervical myelopathy and cervical radiculopathy, recent C-spine surgery, and arthritis presents emergency department with complaints of neck pain and lower back pain along with leg weakness after a fall on Saturday.  Patient states her physical therapist would not touch her today because she had a lot of weakness in the lower legs that is worse than her norm along with increased pain in her neck and a headache from the fall.  Sent her here to the ED to be evaluated.      Physical Exam   Triage Vital Signs: ED Triage Vitals  Enc Vitals Group     BP 05/19/22 1529 (!) 157/90     Pulse Rate 05/19/22 1529 (!) 108     Resp 05/19/22 1529 18     Temp 05/19/22 1529 98.1 F (36.7 C)     Temp Source 05/19/22 1529 Oral     SpO2 05/19/22 1529 97 %     Weight --      Height --      Head Circumference --      Peak Flow --      Pain Score 05/19/22 1531 8     Pain Loc --      Pain Edu? --      Excl. in [place]? --     Most recent vital signs: Vitals:   05/19/22 1529 05/19/22 1846  BP: (!) 157/90 (!) 148/88  Pulse: (!) 108 90  Resp: 18 18  Temp: 98.1 F (36.7 C)   SpO2: 97% 97%     General: Awake, no distress.   CV:  Good peripheral perfusion. regular rate and  rhythm Resp:  Normal effort.  Abd:  No distention.   Other:  C-spine tender, patient taken her c-collar off while in the exam room, instructed her to replace the c-collar, lumbar spine tender, decreased strength in lower extremities with 3 out of 5 strength bilaterally, neurovascular appears to be intact   ED Results / Procedures / Treatments   Labs (all labs ordered are listed, but only abnormal results are displayed) Labs Reviewed - No data to  display   EKG     RADIOLOGY CT of the head, cervical spine, lumbar spine    PROCEDURES:   Procedures   MEDICATIONS ORDERED IN ED: Medications  oxyCODONE-acetaminophen (PERCOCET/ROXICET) 5-325 MG per tablet 1 tablet (1 tablet Oral Given 05/19/22 1728)     IMPRESSION / MDM / [place] / ED COURSE  I reviewed the triage vital signs and the nursing notes.                              Differential diagnosis includes, but is not limited to, fracture, strain, cauda equina, ruptured disc, is subdural, [place]  Patient's presentation is most consistent with acute presentation with potential threat to life or bodily function.   CT head, cervical spine and lumbar spine ordered  I did explain to the patient we do not routinely do MRIs but if there is concern on the CTs we will try and obtain the MRI.   CT of the head independently reviewed interpreted by me acute abnormality  CT cervical spine  I did review the radiologist reads and there is no change no increased stenosis  CT lumbar spine I did review the radiologist read and does state there is a herniated disc at L5-S1.  This may be causing a lot of the patient's symptoms in her legs.  I do not feel that she has cauda equina at this time she has no loss of bowel control.  She is able to walk without foot drop.  I do not feel that she would benefit from admission and MRIs at this time.  Feel that they can do conservative therapy and do MRI outpatient.  Patient and husband are in agreement treatment plan.  She was discharged stable condition.   FINAL CLINICAL IMPRESSION(S) / ED DIAGNOSES   Final diagnoses:  Fall, initial encounter  Strain of neck muscle, initial encounter  Lumbar herniated disc     Rx / DC Orders   ED Discharge Orders     None        Note:  This document was prepared using Dragon voice recognition software and may include unintentional dictation errors.    Versie Starks, PA-C 05/19/22  Clide Deutscher, MD 05/20/22 [phone]

## 2022-05-19 NOTE — Telephone Encounter (Signed)
Gloria [name] (physical therapist from [facility]) called that she was significantly weaker in her left lower extremity - was a 4+ when she saw her 2 weeks ago, was a  3+ today.  Right lower extremity went from a 5 to a 4, 4+.   She also mentioned that she is having more trouble swallowing.   She wanted to make sure you are aware of these things.  She is scheduled for an EMG with Dr Melrose Nakayama tomorrow.

## 2022-05-20 NOTE — Telephone Encounter (Signed)
Per discussion with Dr Izora Ribas, sent pt mychart message offering her an appt with Marzetta Board or Andee Poles

## 2022-07-09 ENCOUNTER — Other Ambulatory Visit: Payer: Self-pay

## 2022-07-09 DIAGNOSIS — M5412 Radiculopathy, cervical region: Secondary | ICD-10-CM

## 2022-07-10 ENCOUNTER — Ambulatory Visit
Admission: RE | Admit: 2022-07-10 | Discharge: 2022-07-10 | Disposition: A | Discharge status: Home / Self Care | Payer: Medicaid Other | Source: Ambulatory Visit | Attending: Neurosurgery | Admitting: Neurosurgery

## 2022-07-10 ENCOUNTER — Ambulatory Visit (INDEPENDENT_AMBULATORY_CARE_PROVIDER_SITE_OTHER): Payer: Medicaid Other | Admitting: Neurosurgery

## 2022-07-10 ENCOUNTER — Encounter: Payer: Self-pay | Admitting: Neurosurgery

## 2022-07-10 VITALS — BP 116/69 | Ht 63.0 in | Wt 138.0 lb

## 2022-07-10 DIAGNOSIS — G8929 Other chronic pain: Secondary | ICD-10-CM | POA: Diagnosis not present

## 2022-07-10 DIAGNOSIS — M5412 Radiculopathy, cervical region: Secondary | ICD-10-CM | POA: Insufficient documentation

## 2022-07-10 DIAGNOSIS — M5116 Intervertebral disc disorders with radiculopathy, lumbar region: Secondary | ICD-10-CM

## 2022-07-10 NOTE — Progress Notes (Signed)
   REFERRING PHYSICIAN:  Alm Bustard, [address] [place],  [place] [address]  DOS: 10/14/21 C4-6 arthroplasty   HISTORY OF PRESENT ILLNESS: DELONDA COLEY is status post cervical arthroplasty.    She continues to have pain around her right shoulder.  She has pain around her shoulder blade as well as her collarbone.  She has pain in the front of her shoulder.  She is some discomfort when she moves.  She has swelling and discomfort in the right side of her neck and she reports swelling in her face.  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  She is having Botox injections tomorrow.  She is having severe low back pain as well as pain down her left leg concerning for a left S1 or L5 radiculopathy.  She has an upcoming MRI scan to evaluate this.    PHYSICAL EXAMINATION:  NEUROLOGICAL:  General: In no acute distress.   Awake, alert, oriented to person, place, and time.  Pupils equal round and reactive to light.  Facial tone is symmetric.  Tongue protrusion is midline.  Strength: Side Biceps Triceps Deltoid Interossei Grip Wrist Ext. Wrist Flex.  R 4+ 4+ 4 4+ 4 4+ 4+  L Incision healing well  She has pain with active range of motion of her right shoulder.    Imaging:  There is a rightward tilt of her C4-5 arthroplasty.  This appears stable  Assessment / Plan: ELLENOR WISNIEWSKI is doing fair after her cervical arthroplasty.  I would like to get flexion-extension x-rays of her neck.  She has low back pain with left-sided sciatica.  I would like to review her MRI scan after it is performed.  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  She has tried physical therapy for her back.  There may be some option to improve her pain depending on the results of her MRI scan.  She will  continue her management with [facility] neurology.    I would like to see her back after her MRI scan.     Venetia Night MD Dept of Neurosurgery

## 2022-07-11 ENCOUNTER — Other Ambulatory Visit: Payer: Self-pay | Admitting: Physical Medicine & Rehabilitation

## 2022-07-11 DIAGNOSIS — G8929 Other chronic pain: Secondary | ICD-10-CM

## 2022-07-23 ENCOUNTER — Encounter: Payer: Self-pay | Admitting: Neurosurgery

## 2022-07-26 ENCOUNTER — Ambulatory Visit
Admission: RE | Admit: 2022-07-26 | Discharge: 2022-07-26 | Disposition: A | Discharge status: Home / Self Care | Payer: Medicaid Other | Source: Ambulatory Visit | Attending: Physical Medicine & Rehabilitation | Admitting: Physical Medicine & Rehabilitation

## 2022-07-26 DIAGNOSIS — G8929 Other chronic pain: Secondary | ICD-10-CM

## 2022-07-28 ENCOUNTER — Ambulatory Visit
Admission: RE | Admit: 2022-07-28 | Discharge: 2022-07-28 | Disposition: A | Discharge status: Home / Self Care | Payer: Medicaid Other | Attending: Neurosurgery | Admitting: Neurosurgery

## 2022-07-28 ENCOUNTER — Ambulatory Visit
Admission: RE | Admit: 2022-07-28 | Discharge: 2022-07-28 | Disposition: A | Discharge status: Home / Self Care | Payer: Medicaid Other | Source: Ambulatory Visit | Attending: Neurosurgery | Admitting: Neurosurgery

## 2022-07-28 DIAGNOSIS — G8929 Other chronic pain: Secondary | ICD-10-CM | POA: Diagnosis present

## 2022-07-28 DIAGNOSIS — M5412 Radiculopathy, cervical region: Secondary | ICD-10-CM

## 2022-07-28 DIAGNOSIS — M5442 Lumbago with sciatica, left side: Secondary | ICD-10-CM | POA: Diagnosis present

## 2022-07-31 ENCOUNTER — Telehealth: Payer: Self-pay

## 2022-07-31 NOTE — Telephone Encounter (Signed)
-----   Message from Venetia Night, MD sent at 07/31/2022  5:14 PM EDT ----- Regarding: RE: reminder to check lumbar MRI Sounds perfect  ----- Message ----- From: Sharlot Gowda, RN Sent: 07/31/2022   5:09 PM EDT To: Venetia Night, MD; Sharlot Gowda, RN Subject: RE: reminder to check lumbar MRI               Mariah Milling ordered the MRI and she has a follow up appt with her on 5/15. I can send Mrs ameela lamar message letting her know that you recommend an ESI and she can discuss it with Dr Mariah Milling at her appt. ----- Message ----- From: Venetia Night, MD Sent: 07/31/2022   4:24 PM EDT To: Sharlot Gowda, RN Subject: RE: reminder to check lumbar MRI               I would say no concerns on the cervical x-rays.  I just looked again at the MRI scan.  The L4-5 level has severe degenerative changes on her CT scan.  There is a possible area of compression.  I think she should have an epidural steroid injection if she is amenable to that.  If so, could set her up with [facility] clinic or with [facility] pain (whoever saw her previously).   ----- Message ----- From: Sharlot Gowda, RN Sent: 07/31/2022   8:15 AM EDT To: Venetia Night, MD Subject: RE: reminder to check lumbar MRI               I see the final reads are back for her lumbar MRI and cervical xray. Did you want me to send her a mychart message with what you said in your previous message? ----- Message ----- From: Venetia Night, MD Sent: 07/28/2022  12:02 PM EDT To: Sharlot Gowda, RN Subject: RE: reminder to check lumbar MRI               Still awaiting final read, but the MRI does not show any large areas of compression.  Xray does not show any hypermobility   ----- Message ----- From: Sharlot Gowda, RN Sent: 07/28/2022  12:00 PM EDT To: Venetia Night, MD Subject: reminder to check lumbar MRI                   Scheduled for 07/26/22 at 5:30pm

## 2022-10-13 ENCOUNTER — Other Ambulatory Visit: Payer: Self-pay | Admitting: Family Medicine

## 2022-10-13 DIAGNOSIS — G959 Disease of spinal cord, unspecified: Secondary | ICD-10-CM

## 2022-10-13 DIAGNOSIS — M7989 Other specified soft tissue disorders: Secondary | ICD-10-CM

## 2022-10-13 DIAGNOSIS — R200 Anesthesia of skin: Secondary | ICD-10-CM

## 2022-10-13 DIAGNOSIS — R221 Localized swelling, mass and lump, neck: Secondary | ICD-10-CM

## 2022-10-16 ENCOUNTER — Ambulatory Visit
Admission: RE | Admit: 2022-10-16 | Discharge: 2022-10-16 | Disposition: A | Discharge status: Home / Self Care | Payer: Medicaid Other | Source: Ambulatory Visit | Attending: Family Medicine | Admitting: Family Medicine

## 2022-10-16 DIAGNOSIS — M5412 Radiculopathy, cervical region: Secondary | ICD-10-CM

## 2022-10-16 DIAGNOSIS — G959 Disease of spinal cord, unspecified: Secondary | ICD-10-CM | POA: Diagnosis present

## 2022-10-16 DIAGNOSIS — M7989 Other specified soft tissue disorders: Secondary | ICD-10-CM | POA: Diagnosis present

## 2022-10-16 DIAGNOSIS — R221 Localized swelling, mass and lump, neck: Secondary | ICD-10-CM

## 2022-10-16 DIAGNOSIS — R200 Anesthesia of skin: Secondary | ICD-10-CM

## 2022-10-16 DIAGNOSIS — R202 Paresthesia of skin: Secondary | ICD-10-CM | POA: Diagnosis present

## 2022-11-22 ENCOUNTER — Emergency Department
Admission: EM | Admit: 2022-11-22 | Discharge: 2022-11-23 | Disposition: A | Discharge status: Home / Self Care | Payer: Medicaid Other | Source: Home / Self Care | Attending: Emergency Medicine | Admitting: Emergency Medicine

## 2022-11-22 ENCOUNTER — Other Ambulatory Visit: Payer: Self-pay

## 2022-11-22 ENCOUNTER — Emergency Department: Payer: Medicaid Other

## 2022-11-22 DIAGNOSIS — T50901A Poisoning by unspecified drugs, medicaments and biological substances, accidental (unintentional), initial encounter: Secondary | ICD-10-CM

## 2022-11-22 DIAGNOSIS — F1290 Cannabis use, unspecified, uncomplicated: Secondary | ICD-10-CM | POA: Insufficient documentation

## 2022-11-22 DIAGNOSIS — T50902A Poisoning by unspecified drugs, medicaments and biological substances, intentional self-harm, initial encounter: Secondary | ICD-10-CM | POA: Insufficient documentation

## 2022-11-22 DIAGNOSIS — F10920 Alcohol use, unspecified with intoxication, uncomplicated: Secondary | ICD-10-CM | POA: Insufficient documentation

## 2022-11-22 LAB — COMPREHENSIVE METABOLIC PANEL
ALT: 21 U/L (ref 0–44)
AST: 17 U/L (ref 15–41)
Albumin: 4.4 g/dL (ref 3.5–5.0)
Alkaline Phosphatase: 49 U/L (ref 38–126)
Anion gap: 11 (ref 5–15)
BUN: 14 mg/dL (ref 6–20)
CO2: 23 mmol/L (ref 22–32)
Calcium: 9.2 mg/dL (ref 8.9–10.3)
Chloride: 106 mmol/L (ref 98–111)
Creatinine, Ser: 0.78 mg/dL (ref 0.44–1.00)
GFR, Estimated: >60 mL/min (ref >60)
Glucose, Bld: 115 mg/dL — ABNORMAL HIGH (ref 70–99)
Potassium: 3.5 mmol/L (ref 3.5–5.1)
Sodium: 140 mmol/L (ref 135–145)
Total Bilirubin: 0.7 mg/dL (ref 0.3–1.2)
Total Protein: 7.4 g/dL (ref 6.5–8.1)

## 2022-11-22 LAB — CBC
HCT: 42.6 % (ref 36.0–46.0)
Hemoglobin: 14.8 g/dL (ref 12.0–15.0)
MCH: 31.6 pg (ref 26.0–34.0)
MCHC: 34.7 g/dL (ref 30.0–36.0)
MCV: 91 fL (ref 80.0–100.0)
Platelets: 231 10*3/uL (ref 150–400)
RBC: 4.68 MIL/uL (ref 3.87–5.11)
RDW: 12 % (ref 11.5–15.5)
WBC: 9.9 10*3/uL (ref 4.0–10.5)
nRBC: 0 % (ref 0.0–0.2)

## 2022-11-22 LAB — ACETAMINOPHEN LEVEL: Acetaminophen (Tylenol), Serum: <10 ug/mL — ABNORMAL LOW (ref 10–30)

## 2022-11-22 LAB — ETHANOL: Alcohol, Ethyl (B): 163 mg/dL — ABNORMAL HIGH (ref <10)

## 2022-11-22 LAB — SALICYLATE LEVEL: Salicylate Lvl: <7 mg/dL — ABNORMAL LOW (ref 7.0–30.0)

## 2022-11-22 MED ORDER — SODIUM CHLORIDE 0.9 % IV BOLUS
1000.0000 mL | Freq: Once | INTRAVENOUS | Status: AC
  Administered 2022-11-22: 1000 mL via INTRAVENOUS

## 2022-11-22 MED ORDER — SODIUM CHLORIDE 0.9 % IV BOLUS
1000.0000 mL | Freq: Once | INTRAVENOUS | Status: AC
  Administered 2022-11-23: 1000 mL via INTRAVENOUS

## 2022-11-22 NOTE — ED Triage Notes (Addendum)
Pt to ED via POV c/o overdose. Pt reports taking 5 tizanidine and 5 klonopins. Pt also says she had 4 shots of whiskey within 3 hrs. Pt able to answer questions appropriately sometimes. Pt slow to answer and is sleepy in triage. When asked if she tried to hurt herself, she begins to cry and doesn't answer. Denies CP

## 2022-11-22 NOTE — ED Notes (Signed)
Poison controlled was called. Per PC, these medications can cause CNS depression, bradycardia, and hypotension. PC recommended fluids and observation for 8-12hrs

## 2022-11-22 NOTE — ED Notes (Signed)
Family at bedside. 

## 2022-11-22 NOTE — BH Assessment (Incomplete)
Comprehensive Clinical Assessment (CCA) Screening, Triage and Referral Note  11/22/2022 JANNELLY LAFLAIR [id number]  Chief Complaint:  Chief Complaint  Patient presents with  . Drug Overdose   Visit Diagnosis: ***  Patient Reported Information How did you hear about [place]? No data recorded What Is the Reason for Your Visit/Call Today? No data recorded How Long Has This Been Causing You Problems? No data recorded What Do You Feel Would Help You the Most Today? No data recorded  Have You Recently Had Any Thoughts About Hurting Yourself? No data recorded Are You Planning to Commit Suicide/Harm Yourself At This time? No data recorded  Have you Recently Had Thoughts About Hurting Someone Karolee Ohs? No data recorded Are You Planning to Harm Someone at This Time? No data recorded Explanation: No data recorded  Have You Used Any Alcohol or Drugs in the Past 24 Hours? No data recorded How Long Ago Did You Use Drugs or Alcohol? No data recorded What Did You Use and How Much? No data recorded  Do You Currently Have a Therapist/Psychiatrist? No data recorded Name of Therapist/Psychiatrist: No data recorded  Have You Been Recently Discharged From Any Office Practice or Programs? No data recorded Explanation of Discharge From Practice/Program: No data recorded   CCA Screening Triage Referral Assessment Type of Contact: No data recorded Telemedicine Service Delivery:   Is this Initial or Reassessment?   Date Telepsych consult ordered in [facility]:    Time Telepsych consult ordered in [facility]:    Location of Assessment: No data recorded Provider Location: No data recorded   Collateral Involvement: No data recorded  Does Patient Have a Court Appointed Legal Guardian? No data recorded Name and Contact of Legal Guardian: No data recorded If Minor and Not Living with Parent(s), Who has Custody? No data recorded Is CPS involved or ever been involved? No data recorded Is APS involved or ever been involved? No  data recorded  Patient Determined To Be At Risk for Harm To Self or Others Based on Review of Patient Reported Information or Presenting Complaint? No data recorded Method: No data recorded Availability of Means: No data recorded Intent: No data recorded Notification Required: No data recorded Additional Information for Danger to Others Potential: No data recorded Additional Comments for Danger to Others Potential: No data recorded Are There Guns or Other Weapons in Your Home? No data recorded Types of Guns/Weapons: No data recorded Are These Weapons Safely Secured?                            No data recorded Who Could Verify You Are Able To Have These Secured: No data recorded Do You Have any Outstanding Charges, Pending Court Dates, Parole/Probation? No data recorded Contacted To Inform of Risk of Harm To Self or Others: No data recorded  Does Patient Present under Involuntary Commitment? No data recorded   [place] of Residence: No data recorded  Patient Currently Receiving the Following Services: No data recorded  Determination of Need: No data recorded  Options For Referral: No data recorded  Discharge Disposition:     Myna Freimark R Gennifer Potenza, LCAS

## 2022-11-22 NOTE — ED Provider Notes (Signed)
[name] Provider Note    Event Date/Time   First MD Initiated Contact with Patient 11/22/22 2310     (approximate)   History   Drug Overdose   HPI  Gloria [name] is a 42 y.o. female who presents to the ED from home with a chief complaint of overdose.  Patient reports chronic cervical spine pain, on tizanidine and Klonopin's.  States over the course of the day she was taking pills all day.  Thinks she took 5 tizanidine and 5 Klonopin's in addition to EtOH (multiple shots of whiskey).  Began to cry in triage when asked if she was intentionally trying to hurt herself and refused to answer.  Denies chest pain, shortness of breath, abdominal pain, nausea, vomiting or dizziness.     Past Medical History   Past Medical History:  Diagnosis Date   Acute pyelonephritis 06/16/2021   Anemia    Arthritis    Headache    Peripheral neuropathy    right arm and shoulder   Pregnancy induced hypertension    Preterm labor    Vaginal Pap smear, abnormal      Active Problem List   Patient Active Problem List   Diagnosis Date Noted   Cervical myelopathy with cervical radiculopathy (HCC) 06/19/2021   Acute pyelonephritis 06/16/2021   Sepsis (HCC) 06/16/2021   Tobacco use 06/16/2021   Migraine 06/16/2021   Labor and delivery, indication for care 12/29/2018   Preterm delivery, delivered 02/02/2018   Preterm labor in third trimester 01/31/2018   Preterm uterine contractions in third trimester, antepartum 01/30/2018   Preterm uterine contractions, antepartum, third trimester 01/23/2018   SVD (spontaneous vaginal delivery) 08/16/2014   Indication for care in labor or delivery 08/15/2014   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     Past Surgical History   Past Surgical History:  Procedure Laterality Date   CERVICAL DISC ARTHROPLASTY N/A 10/14/2021   Procedure: C4-6 ARTHROPLASTY;  Surgeon: Venetia Night, MD;  Location: [facility] ORS;  Service: Neurosurgery;  Laterality:  N/A;   REPAIR VAGINAL CUFF     after hysterectomy   TUBAL LIGATION Bilateral 12/30/2018   Procedure: POST PARTUM TUBAL LIGATION;  Surgeon: Ward, Elenora Fender, MD;  Location: [facility] ORS;  Service: Gynecology;  Laterality: Bilateral;   VAGINAL HYSTERECTOMY N/A 03/04/2021   Procedure: HYSTERECTOMY VAGINAL;  Surgeon: [name], MD;  Location: [facility] ORS;  Service: Gynecology;  Laterality: N/A;     Home Medications   Prior to Admission medications   Medication Sig Start Date End Date Taking? Authorizing Provider  butalbital-aspirin-caffeine [facility]) 50-325-40 MG capsule Take 1 capsule by mouth every 4 (four) hours as needed for headache.    [provider]  carbamazepine (CARBATROL) 100 MG 12 hr capsule Take 100 mg by mouth 2 (two) times daily. 06/06/22   [provider]  clobetasol ointment (TEMOVATE) 0.05 % Apply 1 application topically daily as needed for irritation. 01/30/21   [provider]  clonazePAM (KLONOPIN) 0.5 MG tablet Take 0.5 mg by mouth 3 (three) times daily. 03/19/22   [provider]  gabapentin (NEURONTIN) 600 MG tablet Take 600 mg in the morning, 600 mg in the afternoon, and 1200 mg at night 03/03/22   [provider]  rizatriptan (MAXALT) 5 MG tablet Take 5 mg by mouth as needed for migraine. May repeat in 2 hours if needed    [provider]  tiZANidine (ZANAFLEX) 4 MG tablet Take 4 mg by mouth 2 (two) times daily.  [provider]     Allergies  Patient has no known allergies.   Family History   Family History  Problem Relation Age of Onset   Diabetes Mother    Arthritis Father    Cancer Maternal Grandfather    Diabetes Paternal Grandmother    Arthritis Paternal Grandmother      Physical Exam  Triage Vital Signs: ED Triage Vitals  Encounter Vitals Group     BP 11/22/22 2207 119/74     Systolic BP Percentile --      Diastolic BP Percentile --      Pulse Rate 11/22/22 2207 72     Resp  11/22/22 2207 (!) 30     Temp 11/22/22 2207 99.5 F (37.5 C)     Temp Source 11/22/22 2207 Oral     SpO2 11/22/22 2207 98 %     Weight --      Height --      Head Circumference --      Peak Flow --      Pain Score 11/22/22 2208 0     Pain Loc --      Pain Education --      Exclude from Growth Chart --     Updated Vital Signs: BP 99/71   Pulse 71   Temp 99.5 F (37.5 C) (Oral)   Resp 18   LMP  (LMP Unknown) Comment: Hysterectomy  SpO2 98%    General: Drowsy, mild distress.  CV:  RRR.  Good peripheral perfusion.  Resp:  Decreased effort.  CTAB. Abd:  Nontender.  No distention.  Other:  Somnolent but arousable to voice.  Slurred speech, intoxicated.  Flat affect.   ED Results / Procedures / Treatments  Labs (all labs ordered are listed, but only abnormal results are displayed) Labs Reviewed  COMPREHENSIVE METABOLIC PANEL - Abnormal; Notable for the following components:      Result Value   Glucose, Bld 115 (*)    All other components within normal limits  ETHANOL - Abnormal; Notable for the following components:   Alcohol, Ethyl (B) 163 (*)    All other components within normal limits  SALICYLATE LEVEL - Abnormal; Notable for the following components:   Salicylate Lvl <7.0 (*)    All other components within normal limits  ACETAMINOPHEN LEVEL - Abnormal; Notable for the following components:   Acetaminophen (Tylenol), Serum <10 (*)    All other components within normal limits  URINE DRUG SCREEN, QUALITATIVE ([facility] ONLY) - Abnormal; Notable for the following components:   Opiate, Ur Screen POSITIVE (*)    Cannabinoid 50 Ng, Ur [place] POSITIVE (*)    Benzodiazepine, Ur Scrn POSITIVE (*)    All other components within normal limits  CBC  URINALYSIS, ROUTINE W REFLEX MICROSCOPIC  CBG MONITORING, ED     EKG  ED ECG REPORT I, Hadley Soileau J, the attending physician, personally viewed and interpreted this ECG.   Date: 11/23/2022  EKG Time: 2219  Rate: 76  Rhythm:  normal sinus rhythm  Axis: Normal  Intervals:none  ST&T Change: Nonspecific    RADIOLOGY I have independently visualized interpreted patient's x-ray as well as noted the radiology interpretation:  X-ray: No acute cardiopulmonary process  Official radiology report(s): DG Chest Port 1 View  Result Date: 11/23/2022 CLINICAL DATA:  Overdose. EXAM: PORTABLE CHEST 1 VIEW COMPARISON:  Radiograph 02/12/2011 FINDINGS: The heart size and mediastinal contours are within normal limits. Both lungs are clear. The visualized skeletal structures are unremarkable.  IMPRESSION: No active disease. Electronically Signed   By: Minerva Fester M.D.   On: 11/23/2022 00:12     PROCEDURES:  Critical Care performed: Yes, see critical care procedure note(s)  CRITICAL CARE Performed by: Irean Hong   Total critical care time: 30 minutes  Critical care time was exclusive of separately billable procedures and treating other patients.  Critical care was necessary to treat or prevent imminent or life-threatening deterioration.  Critical care was time spent personally by me on the following activities: development of treatment plan with patient and/or surrogate as well as nursing, discussions with consultants, evaluation of patient's response to treatment, examination of patient, obtaining history from patient or surrogate, ordering and performing treatments and interventions, ordering and review of laboratory studies, ordering and review of radiographic studies, pulse oximetry and re-evaluation of patient's condition.  Marland Kitchen1-3 Lead EKG Interpretation  Performed by: Irean Hong, MD Authorized by: Irean Hong, MD     Interpretation: normal     ECG rate:  70   ECG rate assessment: normal     Rhythm: sinus rhythm     Ectopy: none     Conduction: normal   Comments:     Patient placed on monitor to evaluate for arrhythmias    MEDICATIONS ORDERED IN ED: Medications  sodium chloride 0.9 % bolus 1,000 mL (0  mLs Intravenous Stopped 11/23/22 0312)  sodium chloride 0.9 % bolus 1,000 mL (0 mLs Intravenous Stopped 11/23/22 0312)     IMPRESSION / MDM / ASSESSMENT AND PLAN / ED COURSE  I reviewed the triage vital signs and the nursing notes.                             42 year old female who presents with overdose. Differential diagnosis includes, but is not limited to, alcohol, illicit or prescription medications, or other toxic ingestion; intracranial pathology such as stroke or intracerebral hemorrhage; fever or infectious causes including sepsis; hypoxemia and/or hypercarbia; uremia; trauma; endocrine related disorders such as diabetes, hypoglycemia, and thyroid-related diseases; hypertensive encephalopathy; etc. 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  Patient's presentation is most consistent with acute presentation with potential threat to life or bodily function.  The patient is on the cardiac monitor to evaluate for evidence of arrhythmia and/or significant heart rate changes.  Laboratory results unremarkable other than elevated EtOH.  Although patient tells me she was medicating herself to control her pain, initially she suggested intentional overdose to triage nurse. Will place patient under IVC for her safety.   The patient has been placed in psychiatric observation due to the need to provide a safe environment for the patient while obtaining psychiatric consultation and evaluation, as well as ongoing medical and medication management to treat the patient's condition.  The patient has been placed under full IVC at this time.  Poison control was contacted by triage nurse who recommends IV fluids, supportive treatment and medical observation for 8 to 12 hours.   Clinical Course as of 11/23/22 0160  Wynelle Link Nov 23, 2022  1093 Patient has been observed and monitored in the emergency department for over 8 hours.  Remains  hemodynamically stable, not tachycardic, tachypneic nor hypoxic.  She is medically cleared at this time for psychiatric evaluation and disposition.  Patient has been seen by TTS but not yet evaluated by psychiatric NP. [JS]    Clinical Course User Index [JS]  Irean Hong, MD     FINAL CLINICAL IMPRESSION(S) / ED DIAGNOSES   Final diagnoses:  Intentional drug overdose, initial encounter [facility])  Alcoholic intoxication without complication (HCC)  Marijuana use     Rx / DC Orders   ED Discharge Orders     None        Note:  This document was prepared using Dragon voice recognition software and may include unintentional dictation errors.   Irean Hong, MD 11/23/22 [phone]

## 2022-11-22 NOTE — BH Assessment (Signed)
Comprehensive Clinical Assessment (CCA) Screening, Triage and Referral Note  11/22/2022 DELROSE MORRISSEY [id number] Recommendations for Services/Supports/Treatments: Psych consult/disposition pending.   Darcus Austin Fierman is a 42 year old, English speaking, white female with no known psych hx. Pt presented to ED with complaints of an overdose. Per triage note:  Pt to ED via POV c/o overdose. Pt reports taking 5 tizanidine and 5 klonopins. Pt also says she had 4 shots of whiskey within 3 hrs.  Per patient report, "I had a long day and I had shooting pain in my shoulder; sometimes taking shots is the only thing that alleviates my pain." Pt expressed that she'd forgotten that she'd already taken her prescribed medications and took a second dose. Pt was adamant that she had no intent to commit suicide or self-harm. Pt reported that her children, husband, and family are her anchors. Pt became tearful when explaining her feelings towards her family. Pt explained that she has 8 children; with 1 out of the home. Pt explained that she had back surgery a few years ago and none of the specialists she's had have been able to justify or alleviate her pain. Pt denied having any previous suicide attempts or having any mental health dx. Pt had slurred speech, however her thoughts were relevant and intact. Pt was forthcoming and expansive throughout the assessment. Pt presented with an appropriate mood and a congruent affect. Pt had an unremarkable appearance and maintained normal eye contact. The patient denied current SI, HI or AV/H. Pt admitted to alcohol use 3x per week; drinking an average of 4 shots at a time. Pt's UDS pending; BAL was 163. Pt reported that her last drink was earlier today. Pt denied having symptoms of withdrawal and did not express any motivation for detox/substance abuse treatment.    Collateral: Chequita, Warley (Spouse) [phone] Husband reported that he does not have any safety concerns for the pt.  Spouse expressed a belief that the pt probably had a little too much to drink, which clouded her judgement.   Chief Complaint:  Chief Complaint  Patient presents with   Drug Overdose   Visit Diagnosis: Ingestion  Patient Reported Information How did you hear about [place]? No data recorded What Is the Reason for Your Visit/Call Today? No data recorded How Long Has This Been Causing You Problems? No data recorded What Do You Feel Would Help You the Most Today? No data recorded  Have You Recently Had Any Thoughts About Hurting Yourself? No data recorded Are You Planning to Commit Suicide/Harm Yourself At This time? No data recorded  Have you Recently Had Thoughts About Hurting Someone Karolee Ohs? No data recorded Are You Planning to Harm Someone at This Time? No data recorded Explanation: No data recorded  Have You Used Any Alcohol or Drugs in the Past 24 Hours? No data recorded How Long Ago Did You Use Drugs or Alcohol? No data recorded What Did You Use and How Much? No data recorded  Do You Currently Have a Therapist/Psychiatrist? No data recorded Name of Therapist/Psychiatrist: No data recorded  Have You Been Recently Discharged From Any Office Practice or Programs? No data recorded Explanation of Discharge From Practice/Program: No data recorded   CCA Screening Triage Referral Assessment Type of Contact: No data recorded Telemedicine Service Delivery:   Is this Initial or Reassessment?   Date Telepsych consult ordered in [facility]:    Time Telepsych consult ordered in [facility]:    Location of Assessment: No data recorded Provider Location: No data recorded  Collateral Involvement: No data recorded  Does Patient Have a Court Appointed Legal Guardian? No data recorded Name and Contact of Legal Guardian: No data recorded If Minor and Not Living with Parent(s), Who has Custody? No data recorded Is CPS involved or ever been involved? No data recorded Is APS involved or ever been involved? No  data recorded  Patient Determined To Be At Risk for Harm To Self or Others Based on Review of Patient Reported Information or Presenting Complaint? No data recorded Method: No data recorded Availability of Means: No data recorded Intent: No data recorded Notification Required: No data recorded Additional Information for Danger to Others Potential: No data recorded Additional Comments for Danger to Others Potential: No data recorded Are There Guns or Other Weapons in Your Home? No data recorded Types of Guns/Weapons: No data recorded Are These Weapons Safely Secured?                            No data recorded Who Could Verify You Are Able To Have These Secured: No data recorded Do You Have any Outstanding Charges, Pending Court Dates, Parole/Probation? No data recorded Contacted To Inform of Risk of Harm To Self or Others: No data recorded  Does Patient Present under Involuntary Commitment? No data recorded   [place] of Residence: No data recorded  Patient Currently Receiving the Following Services: No data recorded  Determination of Need: No data recorded  Options For Referral: No data recorded  Discharge Disposition:     Lateisha Thurlow R Jafar Poffenberger, LCAS

## 2022-11-23 DIAGNOSIS — T50901A Poisoning by unspecified drugs, medicaments and biological substances, accidental (unintentional), initial encounter: Secondary | ICD-10-CM

## 2022-11-23 LAB — URINE DRUG SCREEN, QUALITATIVE (ARMC ONLY)
Amphetamines, Ur Screen: NOT DETECTED
Barbiturates, Ur Screen: NOT DETECTED
Benzodiazepine, Ur Scrn: POSITIVE — AB
Cannabinoid 50 Ng, Ur ~~LOC~~: POSITIVE — AB
Cocaine Metabolite,Ur ~~LOC~~: NOT DETECTED
MDMA (Ecstasy)Ur Screen: NOT DETECTED
Methadone Scn, Ur: NOT DETECTED
Opiate, Ur Screen: POSITIVE — AB
Phencyclidine (PCP) Ur S: NOT DETECTED
Tricyclic, Ur Screen: NOT DETECTED

## 2022-11-23 LAB — URINALYSIS, ROUTINE W REFLEX MICROSCOPIC
Bilirubin Urine: NEGATIVE
Glucose, UA: NEGATIVE mg/dL
Hgb urine dipstick: NEGATIVE
Ketones, ur: NEGATIVE mg/dL
Leukocytes,Ua: NEGATIVE
Nitrite: NEGATIVE
Protein, ur: NEGATIVE mg/dL
Specific Gravity, Urine: 1.015 (ref 1.005–1.030)
pH: 6 (ref 5.0–8.0)

## 2022-11-23 MED ORDER — GABAPENTIN 300 MG PO CAPS
600.0000 mg | ORAL_CAPSULE | Freq: Three times a day (TID) | ORAL | Status: DC
  Administered 2022-11-23: 600 mg via ORAL
  Filled 2022-11-23: qty 2

## 2022-11-23 NOTE — ED Notes (Signed)
ivc by MD Sung/psych consult ordered/pending.

## 2022-11-23 NOTE — Consult Note (Signed)
Telepsych Consultation   Reason for Consult:  Overdose Referring Physician:  Roxan Hockey Location of Patient: [facility] Location of Provider: Home Office  Patient Identification: ZAILEY YEAROUT MRN:  [id number] Principal Diagnosis: Drug overdose, accidental or unintentional, initial encounter Diagnosis:  Principal Problem:   Drug overdose, accidental or unintentional, initial encounter   Total Time spent with patient: 45 minutes  Subjective:   ZONDRA BOLSTAD is a 42 y.o. female patient admitted with unintentional overdose.  Patient did initially present with acute overdose after consuming alcohol and taking 2 doses of medication. She denies any previous psychiatric history or suicide attempts. She endorses bi weekly ingestion of alcohol and daily use of marijuana to help with her chronic pain.  The psychiatry team have closely followed this patient, while in the emergency room.  Patient originally presented with acute unintentional overdose while under the influence of alcohol. Family brought her in for concerns of alcohol intoxication.   Patient was eventually stabilized with medication adjustments, close psychiatric follow-up, and recommendations.  Patient seems to be stable and back at baseline, these findings were discussed with psychiatric team and TTS.   On evaluation patient is alert and oriented, calm and cooperative, very pleasant upon approach.  She is noted to be in burgundy scrubs and tearful due to the situation itself (IVC).   Patient denies any access to weapons, denies any alcohol and or substance abuse.  She reports moderate sleep and fair appetite.She is the mother of 8 children, and has numerous protective factors.  Patient denies any auditory and/or visual hallucinations, does not appear to be responding to internal or external stimuli.  There is no evidence of delusional thought content and patient appears to answer all questions appropriately.  At this time patient appears to be  stable to discharge home, with support system services in place.  Patients current symptoms do not reflect the need for inpatient psychiatric hospitalization.    Collateral was obtained last night please see clinician note.  HPI:  Priseis Despain. Boggess is a 42 year old, English speaking, white female with no known psych hx. Pt presented to ED with complaints of an overdose. Per triage note:  Pt to ED via POV c/o overdose. Pt reports taking 5 tizanidine and 5 klonopins. Pt also says she had 4 shots of whiskey within 3 hrs.   Per patient report, "I had a long day and I had shooting pain in my shoulder; sometimes taking shots is the only thing that alleviates my pain." Pt expressed that she'd forgotten that she'd already taken her prescribed medications and took a second dose. Pt was adamant that she had no intent to commit suicide or self-harm. Pt reported that her children, husband, and family are her anchors. Pt became tearful when explaining her feelings towards her family. Pt explained that she has 8 children; with 1 out of the home. Pt explained that she had back surgery a few years ago and none of the specialists she's had have been able to justify or alleviate her pain. Pt denied having any previous suicide attempts or having any mental health dx. Pt had slurred speech, however her thoughts were relevant and intact. Pt was forthcoming and expansive throughout the assessment. Pt presented with an appropriate mood and a congruent affect. Pt had an unremarkable appearance and maintained normal eye contact. The patient denied current SI, HI or AV/H. Pt admitted to alcohol use 3x per week; drinking an average of 4 shots at a time. Pt's UDS pending; BAL was  163. Pt reported that her last drink was earlier today. Pt denied having symptoms of withdrawal and did not express any motivation for detox/substance abuse treatment.   Past Psychiatric History: Pt denies ever been hospitalized for mental health concerns in the  past. Denies any previous history of suicidal thoughts, suicidal ideations, and or non suicidal self injurious behaviors. Pt denies history of aggression, agitation, violent behavior, and or history of homicidal ideations/thoughts.  Patient further denies any current, previous legal charges.  Patient further denies access to guns, weapons, or any engagement with the legal system.  Patient  endorse daily THC. Patient denies history of illicit substances to include synthetic substances and supplemental herbs.    Risk to Self:  Denies Risk to Others:   Denies Prior Inpatient Therapy:  Denies Prior Outpatient Therapy:   Denies  Past Medical History:  Past Medical History:  Diagnosis Date   Acute pyelonephritis 06/16/2021   Anemia    Arthritis    Headache    Peripheral neuropathy    right arm and shoulder   Pregnancy induced hypertension    Preterm labor    Vaginal Pap smear, abnormal     Past Surgical History:  Procedure Laterality Date   CERVICAL DISC ARTHROPLASTY N/A 10/14/2021   Procedure: C4-6 ARTHROPLASTY;  Surgeon: Venetia Night, MD;  Location: [facility] ORS;  Service: Neurosurgery;  Laterality: N/A;   REPAIR VAGINAL CUFF     after hysterectomy   TUBAL LIGATION Bilateral 12/30/2018   Procedure: POST PARTUM TUBAL LIGATION;  Surgeon: Ward, Elenora Fender, MD;  Location: [facility] ORS;  Service: Gynecology;  Laterality: Bilateral;   VAGINAL HYSTERECTOMY N/A 03/04/2021   Procedure: HYSTERECTOMY VAGINAL;  Surgeon: [name], MD;  Location: [facility] ORS;  Service: Gynecology;  Laterality: N/A;   Family History:  Family History  Problem Relation Age of Onset   Diabetes Mother    Arthritis Father    Cancer Maternal Grandfather    Diabetes Paternal Grandmother    Arthritis Paternal Grandmother    Family Psychiatric  History: Denies Social History:  Social History   Substance and Sexual Activity  Alcohol Use Yes   Alcohol/week: 2.0 standard drinks of alcohol   Types: 1 Glasses of wine,  1 Cans of beer per week   Comment: occasionally     Social History   Substance and Sexual Activity  Drug Use No    Social History   Socioeconomic History   Marital status: Married    Spouse name: Not on file   Number of children: Not on file   Years of education: Not on file   Highest education level: Not on file  Occupational History   Not on file  Tobacco Use   Smoking status: Every Day    Current packs/day: 1.00    Types: Cigarettes   Smokeless tobacco: Never  Vaping Use   Vaping status: Never Used  Substance and Sexual Activity   Alcohol use: Yes    Alcohol/week: 2.0 standard drinks of alcohol    Types: 1 Glasses of wine, 1 Cans of beer per week    Comment: occasionally   Drug use: No   Sexual activity: Yes    Birth control/protection: Surgical    Comment: BTL   Other Topics Concern   Not on file  Social History Narrative   Not on file   Social Determinants of Health   Financial Resource Strain: Low Risk  (01/22/2018)   Overall Financial Resource Strain (CARDIA)    Difficulty of  Paying Living Expenses: Not hard at all  Food Insecurity: No Food Insecurity (01/22/2018)   Hunger Vital Sign    Worried About Running Out of Food in the Last Year: Never true    Ran Out of Food in the Last Year: Never true  Transportation Needs: No Transportation Needs (01/22/2018)   PRAPARE - Administrator, Civil Service (Medical): No    Lack of Transportation (Non-Medical): No  Physical Activity: Insufficiently Active (01/22/2018)   Exercise Vital Sign    Days of Exercise per Week: 3 days    Minutes of Exercise per Session: 20 min  Stress: No Stress Concern Present (01/22/2018)   [employer] of Occupational Health - Occupational Stress Questionnaire    Feeling of Stress : Only a little  Social Connections: Somewhat Isolated (01/22/2018)   Social Connection and Isolation Panel [NHANES]    Frequency of Communication with Friends and Family: More than three times a  week    Frequency of Social Gatherings with Friends and Family: Three times a week    Attends Religious Services: Never    Active Member of Clubs or Organizations: No    Attends Banker Meetings: Not asked    Marital Status: Married   Additional Social History:    Allergies:  No Known Allergies  Labs:  Results for orders placed or performed during the hospital encounter of 11/22/22 (from the past 48 hour(s))  Comprehensive metabolic panel     Status: Abnormal   Collection Time: 11/22/22 10:33 PM  Result Value Ref Range   Sodium 140 135 - 145 mmol/L   Potassium 3.5 3.5 - 5.1 mmol/L   Chloride 106 98 - 111 mmol/L   CO2 23 22 - 32 mmol/L   Glucose, Bld 115 (H) 70 - 99 mg/dL    Comment: Glucose reference range applies only to samples taken after fasting for at least 8 hours.   BUN 14 6 - 20 mg/dL   Creatinine, Ser 8.29 0.44 - 1.00 mg/dL   Calcium 9.2 8.9 - 56.2 mg/dL   Total Protein 7.4 6.5 - 8.1 g/dL   Albumin 4.4 3.5 - 5.0 g/dL   AST 17 15 - 41 U/L   ALT 21 0 - 44 U/L   Alkaline Phosphatase 49 38 - 126 U/L   Total Bilirubin 0.7 0.3 - 1.2 mg/dL   GFR, Estimated >13 >08 mL/min    Comment: (NOTE) Calculated using the CKD-EPI Creatinine Equation (2021)    Anion gap 11 5 - 15    Comment: Performed at [facility], [address], [place], [place] [address]  Ethanol     Status: Abnormal   Collection Time: 11/22/22 10:33 PM  Result Value Ref Range   Alcohol, Ethyl (B) 163 (H) <10 mg/dL    Comment: (NOTE) Lowest detectable limit for serum alcohol is 10 mg/dL.  For medical purposes only. Performed at [facility], [address] [place], [place] [address]   Salicylate level     Status: Abnormal   Collection Time: 11/22/22 10:33 PM  Result Value Ref Range   Salicylate Lvl <7.0 (L) 7.0 - 30.0 mg/dL    Comment: Performed at [facility], [address] [place], [place] [address]  Acetaminophen level     Status: Abnormal    Collection Time: 11/22/22 10:33 PM  Result Value Ref Range   Acetaminophen (Tylenol), Serum <10 (L) 10 - 30 ug/mL    Comment: (NOTE) Therapeutic concentrations vary significantly. A  range of 10-30 ug/mL  may be an effective concentration for many patients. However, some  are best treated at concentrations outside of this range. Acetaminophen concentrations >150 ug/mL at 4 hours after ingestion  and >50 ug/mL at 12 hours after ingestion are often associated with  toxic reactions.  Performed at [facility], [address] [place], [place] [address]   cbc     Status: None   Collection Time: 11/22/22 10:33 PM  Result Value Ref Range   WBC 9.9 4.0 - 10.5 K/uL   RBC 4.68 3.87 - 5.11 MIL/uL   Hemoglobin 14.8 12.0 - 15.0 g/dL   HCT 91.4 78.2 - 95.6 %   MCV 91.0 80.0 - 100.0 fL   MCH 31.6 26.0 - 34.0 pg   MCHC 34.7 30.0 - 36.0 g/dL   RDW 21.3 08.6 - 57.8 %   Platelets 231 150 - 400 K/uL   nRBC 0.0 0.0 - 0.2 %    Comment: Performed at [facility], [address], [place], [place] [address]  Urine Drug Screen, Qualitative     Status: Abnormal   Collection Time: 11/23/22 12:00 AM  Result Value Ref Range   Tricyclic, Ur Screen NONE DETECTED NONE DETECTED   Amphetamines, Ur Screen NONE DETECTED NONE DETECTED   MDMA (Ecstasy)Ur Screen NONE DETECTED NONE DETECTED   Cocaine Metabolite,Ur [place] NONE DETECTED NONE DETECTED   Opiate, Ur Screen POSITIVE (A) NONE DETECTED   Phencyclidine (PCP) Ur S NONE DETECTED NONE DETECTED   Cannabinoid 50 Ng, Ur [place] POSITIVE (A) NONE DETECTED   Barbiturates, Ur Screen NONE DETECTED NONE DETECTED   Benzodiazepine, Ur Scrn POSITIVE (A) NONE DETECTED   Methadone Scn, Ur NONE DETECTED NONE DETECTED    Comment: (NOTE) Tricyclics + metabolites, urine    Cutoff 1000 ng/mL Amphetamines + metabolites, urine  Cutoff 1000 ng/mL MDMA (Ecstasy), urine              Cutoff 500 ng/mL Cocaine Metabolite, urine          Cutoff 300 ng/mL Opiate +  metabolites, urine        Cutoff 300 ng/mL Phencyclidine (PCP), urine         Cutoff 25 ng/mL Cannabinoid, urine                 Cutoff 50 ng/mL Barbiturates + metabolites, urine  Cutoff 200 ng/mL Benzodiazepine, urine              Cutoff 200 ng/mL Methadone, urine                   Cutoff 300 ng/mL  The urine drug screen provides only a preliminary, unconfirmed analytical test result and should not be used for non-medical purposes. Clinical consideration and professional judgment should be applied to any positive drug screen result due to possible interfering substances. A more specific alternate chemical method must be used in order to obtain a confirmed analytical result. Gas chromatography / mass spectrometry (GC/MS) is the preferred confirm atory method. Performed at [facility], [address] [place], [place] [address]   Urinalysis, Routine w reflex microscopic -Urine, Clean Catch     Status: None   Collection Time: 11/23/22 12:00 AM  Result Value Ref Range   Color, Urine YELLOW YELLOW   APPearance CLEAR CLEAR   Specific Gravity, Urine 1.015 1.005 - 1.030   pH 6.0 5.0 - 8.0   Glucose, UA NEGATIVE NEGATIVE mg/dL   Hgb urine dipstick  NEGATIVE NEGATIVE   Bilirubin Urine NEGATIVE NEGATIVE   Ketones, ur NEGATIVE NEGATIVE mg/dL   Protein, ur NEGATIVE NEGATIVE mg/dL   Nitrite NEGATIVE NEGATIVE   Leukocytes,Ua NEGATIVE NEGATIVE    Comment: Microscopic not done on urines with negative protein, blood, leukocytes, nitrite, or glucose < 500 mg/dL. Performed at [facility], [address] [place], [place] [address]     Medications:  Current Facility-Administered Medications  Medication Dose Route Frequency Provider Last Rate Last Admin   gabapentin (NEURONTIN) capsule 600 mg  600 mg Oral TID Pilar Jarvis, MD   600 mg at 11/23/22 2956   Current Outpatient Medications  Medication Sig Dispense Refill   butalbital-aspirin-caffeine (FIORINAL) 50-325-40 MG  capsule Take 1 capsule by mouth every 4 (four) hours as needed for headache.     carbamazepine (CARBATROL) 100 MG 12 hr capsule Take 100 mg by mouth 2 (two) times daily.     clobetasol ointment (TEMOVATE) 0.05 % Apply 1 application topically daily as needed for irritation.     clonazePAM (KLONOPIN) 0.5 MG tablet Take 0.5 mg by mouth 3 (three) times daily.     gabapentin (NEURONTIN) 600 MG tablet Take 600 mg in the morning, 600 mg in the afternoon, and 1200 mg at night     rizatriptan (MAXALT) 5 MG tablet Take 5 mg by mouth as needed for migraine. May repeat in 2 hours if needed     tiZANidine (ZANAFLEX) 4 MG tablet Take 4 mg by mouth 2 (two) times daily.      Musculoskeletal: UTA Strength & Muscle Tone: within normal limits Gait & Station: normal Patient leans: N/A          Psychiatric Specialty Exam:  Presentation  General Appearance: Appropriate for Environment; Casual  Eye Contact:Fair  Speech:Normal Rate; Clear and Coherent  Speech Volume:Normal  Handedness:Right   Mood and Affect  Mood:Depressed; Anxious  Affect:Appropriate; Congruent   Thought Process  Thought Processes:Coherent  Descriptions of Associations:Intact  Orientation:Full (Time, Place and Person)  Thought Content:Logical  History of Schizophrenia/Schizoaffective disorder:No data recorded Duration of Psychotic Symptoms:No data recorded Hallucinations:Hallucinations: None  Ideas of Reference:None  Suicidal Thoughts:Suicidal Thoughts: No  Homicidal Thoughts:Homicidal Thoughts: No   Sensorium  Memory:Immediate Good; Recent Good; Remote Good  Judgment:Good  Insight:Good   Executive Functions  Concentration:Good  Attention Span:Good  Recall:Good  Fund of Knowledge:Good  Language:Good   Psychomotor Activity  Psychomotor Activity:Psychomotor Activity: Normal   Assets  Assets:Communication Skills; Desire for Improvement; Financial Resources/Insurance; Housing; Leisure  Time; Physical Health; Resilience; Social Support   Sleep  Sleep:Sleep: Fair    Physical Exam: Physical Exam ROS Blood pressure (!) 126/94, pulse 77, temperature 98.1 F (36.7 C), resp. rate 16, SpO2 100%, unknown if currently breastfeeding. There is no height or weight on file to calculate BMI.  Treatment Plan Summary: Plan Psych cleared at this time. -Rescind IVC at this time.  -Recommend decrease use of alcohol and securing medications, maybe in a pillbox to repeat unintentional overdose.    TTS to sign off at this time.  Disposition: No evidence of imminent risk to self or others at present.   Patient does not meet criteria for psychiatric inpatient admission. Supportive therapy provided about ongoing stressors. Refer to IOP. Discussed crisis plan, support from social network, calling 911, coming to the Emergency Department, and calling Suicide Hotline.  This service was provided via telemedicine using a 2-way, interactive audio and video technology.  Names of all persons participating in this telemedicine service and  their role in this encounter. Name: Caryn Bee Role: DNP,PMHNP-BC  Name: Robinette Haines Role: TTS Counselor  Name: Elane Fritz Role: Psychiatrist  Name:  Role:     Maryagnes Amos, FNP 11/23/2022 11:29 AM

## 2022-11-23 NOTE — ED Notes (Signed)
This Consulting civil engineer noticed pt had a visitor outside of visiting hours and also noticed pt being brought in outside [employer], pt and family member (husband) were made aware of IVC and that pt can only have visitors during visitor hours (rule sheet given to family member) and that pt could not have outside food, family member and pt started to escalate when IVC rules were explained to pt. Pt was also found to not be dressed out with multiple monitoring cords still in room. Security asked to wand family member and pt for staff safety.   Registration at stat desk was made aware that she needs to start calling the nurses to make sure they are appropriate for visitors.   Primary RN dressing pt out at this time and removing all monitoring cords and was advised to make a safety zone portal due to these things not being done prior to day shift arrival, although IVC was placed prior to day shift at 2323 on 8/31 and acknowledged by night shift RN, Feliz Beam.

## 2022-11-23 NOTE — ED Notes (Signed)
Pt is sleeping, pt is alone husband has left the hospital.

## 2022-11-23 NOTE — ED Notes (Signed)
Pt belonging returned at this time.

## 2022-11-23 NOTE — ED Notes (Signed)
Pt refused shower.  

## 2022-11-23 NOTE — ED Notes (Signed)
Patients husband came back to room with patient belongings. Unsure of who let husband back without notifying primary RN. Patient and husband verbally upset and wanting to take patient home. RN informed of IVC and education regarding it. Patient husband provided a copy of BH rules. Patient husband took belongings and states he will be back during visitor hours. Patient dressed out into appropriate attire and waiting on psych.

## 2022-11-23 NOTE — ED Notes (Signed)
Pt received lunch 

## 2022-11-23 NOTE — Discharge Instructions (Signed)
Please seek medical attention and help for any thoughts about wanting to harm yourself, harm others, any concerning change in behavior, severe depression, inappropriate drug use or any other new or concerning symptoms. ° °

## 2022-11-23 NOTE — ED Notes (Signed)
Pt refuses to wear BP cuff and pulse oximeter.

## 2022-12-09 ENCOUNTER — Other Ambulatory Visit: Payer: Self-pay | Admitting: Family Medicine

## 2022-12-09 DIAGNOSIS — G9589 Other specified diseases of spinal cord: Secondary | ICD-10-CM

## 2022-12-09 DIAGNOSIS — M4802 Spinal stenosis, cervical region: Secondary | ICD-10-CM

## 2022-12-09 DIAGNOSIS — M542 Cervicalgia: Secondary | ICD-10-CM

## 2022-12-31 ENCOUNTER — Ambulatory Visit: Admission: RE | Admit: 2022-12-31 | Payer: Medicaid Other | Source: Ambulatory Visit

## 2023-01-23 ENCOUNTER — Ambulatory Visit
Admission: RE | Admit: 2023-01-23 | Discharge: 2023-01-23 | Disposition: A | Discharge status: Home / Self Care | Payer: Medicaid Other | Source: Ambulatory Visit | Attending: Family Medicine | Admitting: Family Medicine

## 2023-01-23 DIAGNOSIS — M542 Cervicalgia: Secondary | ICD-10-CM | POA: Diagnosis present

## 2023-01-23 DIAGNOSIS — M4802 Spinal stenosis, cervical region: Secondary | ICD-10-CM | POA: Insufficient documentation

## 2023-01-23 DIAGNOSIS — G9589 Other specified diseases of spinal cord: Secondary | ICD-10-CM | POA: Insufficient documentation

## 2023-02-11 ENCOUNTER — Other Ambulatory Visit: Payer: Self-pay | Admitting: Family Medicine

## 2023-02-11 DIAGNOSIS — R1310 Dysphagia, unspecified: Secondary | ICD-10-CM

## 2023-02-11 DIAGNOSIS — R200 Anesthesia of skin: Secondary | ICD-10-CM

## 2023-02-23 ENCOUNTER — Ambulatory Visit: Payer: Medicaid Other

## 2023-03-06 ENCOUNTER — Ambulatory Visit: Payer: Medicaid Other | Attending: Family Medicine

## 2023-04-01 ENCOUNTER — Emergency Department: Payer: Medicaid Other

## 2023-04-01 ENCOUNTER — Encounter: Payer: Self-pay | Admitting: Emergency Medicine

## 2023-04-01 ENCOUNTER — Other Ambulatory Visit: Payer: Self-pay

## 2023-04-01 ENCOUNTER — Emergency Department
Admission: EM | Admit: 2023-04-01 | Discharge: 2023-04-01 | Disposition: A | Discharge status: Home / Self Care | Payer: Medicaid Other | Attending: Emergency Medicine | Admitting: Emergency Medicine

## 2023-04-01 DIAGNOSIS — Z720 Tobacco use: Secondary | ICD-10-CM | POA: Insufficient documentation

## 2023-04-01 DIAGNOSIS — M542 Cervicalgia: Secondary | ICD-10-CM

## 2023-04-01 LAB — CBC WITH DIFFERENTIAL/PLATELET
Abs Immature Granulocytes: 0.04 10*3/uL (ref 0.00–0.07)
Basophils Absolute: 0 10*3/uL (ref 0.0–0.1)
Basophils Relative: 0 %
Eosinophils Absolute: 0 10*3/uL (ref 0.0–0.5)
Eosinophils Relative: 0 %
HCT: 41.6 % (ref 36.0–46.0)
Hemoglobin: 14.5 g/dL (ref 12.0–15.0)
Immature Granulocytes: 0 %
Lymphocytes Relative: 15 %
Lymphs Abs: 2 10*3/uL (ref 0.7–4.0)
MCH: 31.9 pg (ref 26.0–34.0)
MCHC: 34.9 g/dL (ref 30.0–36.0)
MCV: 91.6 fL (ref 80.0–100.0)
Monocytes Absolute: 0.5 10*3/uL (ref 0.1–1.0)
Monocytes Relative: 4 %
Neutro Abs: 10.6 10*3/uL — ABNORMAL HIGH (ref 1.7–7.7)
Neutrophils Relative %: 81 %
Platelets: 270 10*3/uL (ref 150–400)
RBC: 4.54 MIL/uL (ref 3.87–5.11)
RDW: 12 % (ref 11.5–15.5)
WBC: 13.2 10*3/uL — ABNORMAL HIGH (ref 4.0–10.5)
nRBC: 0 % (ref 0.0–0.2)

## 2023-04-01 LAB — BASIC METABOLIC PANEL
Anion gap: 8 (ref 5–15)
BUN: 9 mg/dL (ref 6–20)
CO2: 23 mmol/L (ref 22–32)
Calcium: 8.9 mg/dL (ref 8.9–10.3)
Chloride: 106 mmol/L (ref 98–111)
Creatinine, Ser: 0.55 mg/dL (ref 0.44–1.00)
GFR, Estimated: >60 mL/min (ref >60)
Glucose, Bld: 108 mg/dL — ABNORMAL HIGH (ref 70–99)
Potassium: 3.8 mmol/L (ref 3.5–5.1)
Sodium: 137 mmol/L (ref 135–145)

## 2023-04-01 LAB — POC URINE PREG, ED: Preg Test, Ur: NEGATIVE

## 2023-04-01 MED ORDER — MORPHINE SULFATE (PF) 4 MG/ML IV SOLN
4.0000 mg | Freq: Once | INTRAVENOUS | Status: AC
  Administered 2023-04-01: 4 mg via INTRAVENOUS
  Filled 2023-04-01: qty 1

## 2023-04-01 MED ORDER — IOHEXOL 350 MG/ML SOLN
75.0000 mL | Freq: Once | INTRAVENOUS | Status: AC | PRN
  Administered 2023-04-01: 75 mL via INTRAVENOUS

## 2023-04-01 MED ORDER — KETOROLAC TROMETHAMINE 15 MG/ML IJ SOLN
15.0000 mg | Freq: Once | INTRAMUSCULAR | Status: AC
  Administered 2023-04-01: 15 mg via INTRAVENOUS
  Filled 2023-04-01: qty 1

## 2023-04-01 NOTE — Discharge Instructions (Addendum)
 You may call the neurology phone number provided, or visit this website for another [facility]: [email or web address]. You may also follow up with Dr. Claudene with neurosurgery. Please return for any new, worsening, or change in symptoms or other concerns.  It was a pleasure caring for you today.

## 2023-04-01 NOTE — ED Triage Notes (Signed)
 Pt via POV from home. Pt c/o neck pain started 2 night ago reports she heard her neck pop when she rolled over in bed and since then pt has been having some increasing pain. Pt states the pain now travels up her neck. Pt has tried OTC medication without relief. Pt is A&Ox4 and NAD

## 2023-04-01 NOTE — ED Provider Notes (Signed)
 [facility] Provider Note    Event Date/Time   First MD Initiated Contact with Patient 04/01/23 1301     (approximate)   History   Neck Pain   HPI  Gloria [name] is a 43 y.o. female with a past medical history of cervical myelopathy with cervical radiculopathy who presents today for evaluation of neck pain.  Patient reports that she has had neck pain for the past 3 to 4 years, with radiation down her right arm and also occasional pain in her face for which she was being worked up for trigeminal neuralgia.  She reports that she has had numerous imaging scans including MRIs, MRAs, swallow studies and more.  She has seen multiple neurosurgeons and she reports that she has seen 10 neurologists and no one can figure it out. She reports that she has been to [facility] without answers.  She reports that 2 days ago she rolled over in bed and felt a sharp pop in her right anterior neck and feels that all of her symptoms have worsened.  She reports that her pain is in the same location as it normally is, just feels more severe than usual.  She denies any visual changes.  She reports that she feels slightly dizzy, this is not new for her.  She has not had any nausea or vomiting.  Patient Active Problem List   Diagnosis Date Noted   Drug overdose, accidental or unintentional, initial encounter 11/23/2022   Cervical myelopathy with cervical radiculopathy (HCC) 06/19/2021   Acute pyelonephritis 06/16/2021   Sepsis (HCC) 06/16/2021   Tobacco use 06/16/2021   Migraine 06/16/2021   Labor and delivery, indication for care 12/29/2018   Preterm delivery, delivered 02/02/2018   Preterm labor in third trimester 01/31/2018   Preterm uterine contractions in third trimester, antepartum 01/30/2018   Preterm uterine contractions, antepartum, third trimester 01/23/2018   SVD (spontaneous vaginal delivery) 08/16/2014   Indication for care in labor or delivery  08/15/2014   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          Physical Exam   Triage Vital Signs: ED Triage Vitals  Encounter Vitals Group     BP 04/01/23 1159 (!) 152/102     Systolic BP Percentile --      Diastolic BP Percentile --      Pulse Rate 04/01/23 1159 99     Resp 04/01/23 1159 18     Temp 04/01/23 1159 98.7 F (37.1 C)     Temp Source 04/01/23 1159 Oral     SpO2 04/01/23 1159 98 %     Weight 04/01/23 1158 140 lb (63.5 kg)     Height 04/01/23 1158 5' 3 (1.6 m)     Head Circumference --      Peak Flow --      Pain Score 04/01/23 1158 10     Pain Loc --      Pain Education --      Exclude from Growth Chart --     Most recent vital signs: Vitals:   04/01/23 1534 04/01/23 1534  BP:  133/76  Pulse: 88   Resp: 18   Temp:    SpO2:  99%    Physical Exam Vitals and nursing note reviewed.  Constitutional:      General: Awake and alert. No acute distress.    Appearance: Normal appearance. The patient is normal weight.  HENT:     Head: Normocephalic and  atraumatic.     Mouth: Mucous membranes are moist.  Eyes:     General: PERRL. Normal EOMs        Right eye: No discharge.        Left eye: No discharge.     Conjunctiva/sclera: Conjunctivae normal.  Cardiovascular:     Rate and Rhythm: Normal rate and regular rhythm.     Pulses: Normal pulses.  Pulmonary:     Effort: Pulmonary effort is normal. No respiratory distress.     Breath sounds: Normal breath sounds.  Abdominal:     Abdomen is soft. There is no abdominal tenderness. No rebound or guarding. No distention. Musculoskeletal:        General: No swelling. Normal range of motion.     Cervical back: Normal range of motion and neck supple. Diffuse tenderness to light touch without specific vertebral tenderness. Mild tenderness along the right sternocleidomastoid without swelling, erythema, ecchymosis or other injuries noted.  Full range of motion of neck.  Pain with Spurling test.  Normal strength and sensation in  bilateral upper extremities. Normal grip strength bilaterally.  Normal intrinsic muscle function of the hand bilaterally.  Normal radial pulses bilaterally.  Negative Hoffmann sign. Skin:    General: Skin is warm and dry.     Capillary Refill: Capillary refill takes less than 2 seconds.     Findings: No rash.  Neurological:     Mental Status: The patient is awake and alert.   Neurological: GCS 15 alert and oriented x3 Normal speech, no expressive or receptive aphasia or dysarthria Cranial nerves II through XII intact Normal visual fields 5 out of 5 strength in all 4 extremities with intact sensation throughout No extremity drift Normal finger-to-nose testing, no limb or truncal ataxia    ED Results / Procedures / Treatments   Labs (all labs ordered are listed, but only abnormal results are displayed) Labs Reviewed  BASIC METABOLIC PANEL - Abnormal; Notable for the following components:      Result Value   Glucose, Bld 108 (*)    All other components within normal limits  CBC WITH DIFFERENTIAL/PLATELET - Abnormal; Notable for the following components:   WBC 13.2 (*)    Neutro Abs 10.6 (*)    All other components within normal limits  POC URINE PREG, ED     EKG     RADIOLOGY I independently reviewed and interpreted imaging and agree with radiologists findings.     PROCEDURES:  Critical Care performed:   Procedures   MEDICATIONS ORDERED IN ED: Medications  morphine  (PF) 4 MG/ML injection 4 mg (4 mg Intravenous Given 04/01/23 1404)  ketorolac  (TORADOL ) 15 MG/ML injection 15 mg (15 mg Intravenous Given 04/01/23 1405)  iohexol  (OMNIPAQUE ) 350 MG/ML injection 75 mL (75 mLs Intravenous Contrast Given 04/01/23 1442)     IMPRESSION / MDM / ASSESSMENT AND PLAN / ED COURSE  I reviewed the triage vital signs and the nursing notes.   Differential diagnosis includes, but is not limited to, cervical radiculopathy, vertebral/carotid dissection, muscle strain/spasm.  I  reviewed the patient's chart.  Patient has a long history of radicular symptoms, and recently accidentally overdosed with alcohol, tizanidine, and Klonopin.  She was cleared by psychiatry.  Most recently had an MRI of her cervical spine in 01/23/2023 that reveals arthroplasty at C4-C5 and C5-C6 with residual mild right C5 and bilateral C6 foraminal narrowing, disc bulge at C6-C7, and disc bulge at C3-C4.  Overall appearance of the cervical spine was stable  compared to MRI from 04/23/2022.  She has had numerous imaging studies of her cervical spine.  No history of IV drug use, no constitutional symptoms to suggest epidural abscess.  Not anticoagulated, do not suspect epidural hematoma.  Patient has normal grip strength bilaterally, normal intrinsic muscle function of the hands bilaterally.  She is able to move her arm, though has pain with overhead movements.  She has radicular symptoms in her right arm that she feels are consistent with her previous radicular symptoms which she has had for the past several years.  She has no focal neurological deficits otherwise.  Given her new pop and pain over her lateral aspect of her neck with her headache, CT angiogram was obtained for evaluation of vertebral/carotid artery dissection, and this was negative.  I suspect that patient has ongoing/recurrence of her chronic neck pain and headache.  She has no cranial nerve deficit, no ptosis, no proptosis, no visual changes, no numbness, no seizures to suggest cavernous sinus thrombosis, and symptoms have been ongoing for multiple years.  Considered repeat MRI, the patient reports that her pain is no different, and she most recently had an MRI on 01/23/2023 and there are no new focal neurological deficits.  Patient has already seen multiple neurosurgeons and multiple neurologists for the same complaint.  I recommended that she follow-up with her outpatient providers, though we did discuss strict return precautions for any new,  worsening, or change in symptoms or other concerns.  Her pain was controlled in the emergency department with morphine  and Toradol  with good effect.  Patient and her husband understand and agree with plan.  She was discharged in stable condition.  Patient's presentation is most consistent with acute presentation with potential threat to life or bodily function.      FINAL CLINICAL IMPRESSION(S) / ED DIAGNOSES   Final diagnoses:  Neck pain     Rx / DC Orders   ED Discharge Orders     None        Note:  This document was prepared using Dragon voice recognition software and may include unintentional dictation errors.   Militza Devery E, PA-C 04/01/23 1830    Willo Dunnings, MD 04/02/23 2258

## 2023-04-09 ENCOUNTER — Ambulatory Visit: Payer: Medicaid Other | Admitting: Neurosurgery

## 2023-04-09 ENCOUNTER — Encounter: Payer: Self-pay | Admitting: Neurosurgery

## 2023-04-09 VITALS — BP 124/68 | Ht 63.0 in | Wt 140.0 lb

## 2023-04-09 DIAGNOSIS — G8929 Other chronic pain: Secondary | ICD-10-CM

## 2023-04-09 DIAGNOSIS — D72828 Other elevated white blood cell count: Secondary | ICD-10-CM

## 2023-04-09 DIAGNOSIS — M62838 Other muscle spasm: Secondary | ICD-10-CM

## 2023-04-09 DIAGNOSIS — R519 Headache, unspecified: Secondary | ICD-10-CM | POA: Diagnosis not present

## 2023-04-09 DIAGNOSIS — M47892 Other spondylosis, cervical region: Secondary | ICD-10-CM

## 2023-04-09 DIAGNOSIS — M542 Cervicalgia: Secondary | ICD-10-CM | POA: Diagnosis not present

## 2023-04-09 NOTE — Progress Notes (Addendum)
REFERRING PHYSICIAN:  Alm Bustard, Np [address] [place],  [place] [address]  HISTORY OF PRESENT ILLNESS:  04/09/23 Gloria [name] presents today for ER follow-up.  She was seen in the ER on 04/01/2023 for acute on chronic neck pain.  She states that it started 2 days prior to her ER presentation when she was rolling out of bed and felt a pop in her right anterior neck and worsening of her symptoms.  Otherwise her pain is in the same location as previously.  She was last seen by Dr. Myer Haff in April with similar symptoms.  At that time he recommended cervical flexion-extension x-rays as well as wanting to follow-up with her after lumbar MRI.  She did have a lumbar MRI and the results were reviewed by Dr. Marcell Barlow.  He recommended an epidural injection.  She had an epidural injection with Dr. Mariah Milling on 08/21/2022 without any significant improvement to her lumbar symptoms.  Today she describes persistent right sided facial pain that radiates into the base of her skull and neck as well as tenderness in the anterior portion of her right neck and axilla.  She has intermittent throbbing pain down her right arm and into her hand and involuntary muscle twitching on the right side.  Activity makes this worse and sleep provide self improvement.  She also reports significant improvement with anti-inflammatory medications and steroids.  Her primary complaint today is her headaches.  She was seen and evaluated by neurology and was started on Maxalt and Carbamazepine without relief. She has also tried botox injection and dry needling. She reports significant swelling in her neck and shoulder after dry needling.  07/10/22 Dr. Carmelina Peal is status post cervical arthroplasty.    She continues to have pain around her right shoulder.  She has pain around her shoulder blade as well as her collarbone.  She has pain in the front of her shoulder.  She is some discomfort when she moves.  She has  swelling and discomfort in the right side of her neck and she reports swelling in her face.  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  She is having Botox injections tomorrow.  She is having severe low back pain as well as pain down her left leg concerning for a left S1 or L5 radiculopathy.  She has an upcoming MRI scan to evaluate this.    PHYSICAL EXAMINATION:  NEUROLOGICAL:  General: In no acute distress.   Awake, alert, oriented to person, place, and time.  Pupils equal round and reactive to light.  Facial tone is symmetric.  Tongue protrusion is midline.  Strength: Side Biceps Triceps Deltoid Interossei Grip Wrist Ext. Wrist Flex.  R 4+ 4+ 4 4+ 4 4+ 4+  L 5 5 5 5 5 5 5    Incision healing well  She has pain with active range of motion of her right shoulder.    Imaging:  There is a rightward tilt of her C4-5 arthroplasty.  This appears stable  Assessment / Plan: Gloria [name] presents today with ongoing neck pain, throbbing to her right arm, muscle spasms, and headaches despite multimodal treatment.  In reviewing her records she has failed to have improvement with physical therapy, multiple medications, Botox injections, and dry needling.  Her symptoms however remain severe and significantly affecting her quality of life.  In reviewing her lab work that is available  in the system she has had a persistent, albeit mild elevated white blood cell count.  She was seen by rheumatology who did not feel an inflammatory arthritis was the cause of her symptoms.  In reviewing her most recent cervical MRI and x-rays, she does have cervical spondylosis however the severity of her pain and symptoms do not correspond with these findings.  I would like to review her symptoms with Dr. Katrinka Blazing as well as reach out to her rheumatologist and neurology to discuss any additional reasonable studies and  further plan of care.  I will contact the patient via MyChart with the outcome of these conversations and further plan of care.  She expressed understanding and was in agreement with this plan.  Manning Charity PA-C Dept of Neurosurgery    I spent a total of 125 minutes in both face-to-face and non-face-to-face activities for this visit on the date of this encounter extensive review of her records, extensive discussion regarding her symptoms, physical exam, and documentation.

## 2023-04-10 NOTE — Progress Notes (Unsigned)
Referring Physician:  Alm Bustard, NP [address] [place],  [place] [address]  Primary Physician:  Alm Bustard, NP  HISTORY OF PRESENT ILLNESS:  04/13/2023.  Seeing patient today for possible thoracic outlet syndrome or alternative causes for right upper extremity/right upper quadrant pain.  Her details are clearly outlined in Manning Charity note from 04/09/2023.  She has a history of cervical myeloradiculopathy and had a cervical arthroplasty for decompression of her cervical nerve roots.  She continues to have right upper quadrant issues with lateral neck pain, chest pain, upper extremity pain and axillary pain.  This is worse with overhead movements.  It is worse with exertion.  She does have a history of competitive softball playing and throwing sports.  She had some possible consideration for cervical dystonia and was being evaluated for Botox injections.  Most of her pain numbness and tingling goes down to the chest and the medial aspect of her arm and to the medial aspect of her hand.  She has hand notes that is worse with exertion.  The symptoms are causing a significant impact on the patient's life.   I have utilized the care everywhere function in epic to review the outside records available from external health systems.  Review of Systems:  A 10 point review of systems is negative, except for the pertinent positives and negatives detailed in the HPI.  Past Medical History: Past Medical History:  Diagnosis Date   Acute pyelonephritis 06/16/2021   Anemia    Arthritis    Headache    Peripheral neuropathy    right arm and shoulder   Pregnancy induced hypertension    Preterm labor    Vaginal Pap smear, abnormal     Past Surgical History: Past Surgical History:  Procedure Laterality Date   CERVICAL DISC ARTHROPLASTY N/A 10/14/2021   Procedure: C4-6 ARTHROPLASTY;  Surgeon: Venetia Night, MD;  Location: [facility] ORS;  Service: Neurosurgery;   Laterality: N/A;   REPAIR VAGINAL CUFF     after hysterectomy   TUBAL LIGATION Bilateral 12/30/2018   Procedure: POST PARTUM TUBAL LIGATION;  Surgeon: Ward, Elenora Fender, MD;  Location: [facility] ORS;  Service: Gynecology;  Laterality: Bilateral;   VAGINAL HYSTERECTOMY N/A 03/04/2021   Procedure: HYSTERECTOMY VAGINAL;  Surgeon: [name], MD;  Location: [facility] ORS;  Service: Gynecology;  Laterality: N/A;    Allergies: Allergies as of 04/13/2023   (No Known Allergies)    Medications:  Current Outpatient Medications:    acetaminophen (TYLENOL) 500 MG tablet, Take by mouth., Disp: , Rfl:    clobetasol ointment (TEMOVATE) 0.05 %, Apply 1 application topically daily as needed for irritation., Disp: , Rfl:    gabapentin (NEURONTIN) 600 MG tablet, Take 600 mg in the morning, 600 mg in the afternoon, and 1200 mg at night, Disp: , Rfl:    naloxone (NARCAN) nasal spray 4 mg/0.1 mL, Place into the nose., Disp: , Rfl:    rizatriptan (MAXALT) 5 MG tablet, Take 5 mg by mouth as needed for migraine. May repeat in 2 hours if needed, Disp: , Rfl:    tiZANidine (ZANAFLEX) 4 MG tablet, Take 4 mg by mouth 2 (two) times daily., Disp: , Rfl:   Social History: Social History   Tobacco Use   Smoking status: Every Day    Current packs/day: 1.00    Types: Cigarettes   Smokeless tobacco: Never  Vaping Use   Vaping status: Never Used  Substance Use Topics   Alcohol use: Yes  Alcohol/week: 2.0 standard drinks of alcohol    Types: 1 Glasses of wine, 1 Cans of beer per week    Comment: occasionally   Drug use: No    Family Medical History: Family History  Problem Relation Age of Onset   Diabetes Mother    Arthritis Father    Cancer Maternal Grandfather    Diabetes Paternal Grandmother    Arthritis Paternal Grandmother     Physical Examination: Vitals:   04/13/23 1059  BP: (!) 136/94    General: Patient is in no apparent distress. Attention to examination is appropriate.  Neck:   Supple.   Full range of motion.  She has a positive Tinel sign at the supraclavicular fossa, this radiates into the lateral face and down into her axilla and medial arm, her subclavicular space did not have any clear Tinel  Respiratory: Patient is breathing without any difficulty.   NEUROLOGICAL:     Awake, alert, oriented to person, place, and time.  Speech is clear and fluent.   Cranial Nerves: Pupils equal round and reactive to light.  Facial tone is symmetric.  Facial sensation is symmetric. Shoulder shrug is symmetric. Tongue protrusion is midline.    Strength: She has pain limited weakness in the right upper extremity  Her radial pulse shows a significant drop with her arm above her head, this drops even further with shoulder retraction.  She has a positive Roos test.     Imaging: She has had a previous MRI of her brachial plexus without arteriography, did not demonstrate any mid brachial plexus masses.  She does have some elongated neck musculature, with considerable scalene mass.  I have personally reviewed the images and agree with the above interpretation.  Medical Decision Making/Assessment and Plan: Ms. Gloria [name] is a pleasant 43 y.o. female with history of cervical radiculopathy and right upper extremity pain.  She is continuing to have pain in her right upper extremity after decompression.  On evaluation today she does have some signs and systems that could be consistent with thoracic outlet syndrome so we will plan on making a referral to the thoracic outlet team at [facility] in [place].  She has a severe supraclavicular Tinel's sign, decreased pulses with arms overhead, and positive Roos test.  She also has muscular pains throughout her whole hand but her electric like sensations spare the median distribution which could localize to the lower trunk.  Her last EMG had to be aborted due to significant pain so although was read as normal was not completed.  Should she not have any  improvement with the thoracic outlet evaluation, PT and possible scalene injections we could consider her for the possibility of a cervical spinal cord stimulator as she does have some occipital cervical genic headaches.  Thank you for involving me in the care of this patient.    Lovenia Kim MD/MSCR Neurosurgery

## 2023-04-13 ENCOUNTER — Ambulatory Visit (INDEPENDENT_AMBULATORY_CARE_PROVIDER_SITE_OTHER): Payer: Medicaid Other | Admitting: Neurosurgery

## 2023-04-13 ENCOUNTER — Encounter: Payer: Self-pay | Admitting: Neurosurgery

## 2023-04-13 VITALS — BP 136/94 | Ht 63.0 in | Wt 140.0 lb

## 2023-04-13 DIAGNOSIS — G8929 Other chronic pain: Secondary | ICD-10-CM | POA: Insufficient documentation

## 2023-04-13 DIAGNOSIS — G540 Brachial plexus disorders: Secondary | ICD-10-CM

## 2023-04-13 DIAGNOSIS — R519 Headache, unspecified: Secondary | ICD-10-CM

## 2023-04-13 DIAGNOSIS — M542 Cervicalgia: Secondary | ICD-10-CM | POA: Insufficient documentation

## 2023-04-13 DIAGNOSIS — M5412 Radiculopathy, cervical region: Secondary | ICD-10-CM | POA: Insufficient documentation

## 2023-04-16 ENCOUNTER — Other Ambulatory Visit: Payer: Self-pay | Admitting: *Deleted

## 2023-04-16 ENCOUNTER — Ambulatory Visit (HOSPITAL_COMMUNITY)
Admission: RE | Admit: 2023-04-16 | Discharge: 2023-04-16 | Disposition: A | Discharge status: Home / Self Care | Payer: Medicaid Other | Source: Ambulatory Visit | Attending: Vascular Surgery | Admitting: Vascular Surgery

## 2023-04-16 DIAGNOSIS — M79603 Pain in arm, unspecified: Secondary | ICD-10-CM | POA: Insufficient documentation

## 2023-04-20 ENCOUNTER — Ambulatory Visit: Payer: Medicaid Other | Attending: Family Medicine

## 2023-04-20 ENCOUNTER — Ambulatory Visit: Payer: Medicaid Other

## 2023-05-04 NOTE — Progress Notes (Signed)
Patient name: Gloria [name] MRN: [id number] DOB: Mar 02, 1981 Sex: female  REASON FOR CONSULT: TOS evaluation  HPI: Gloria [name] is a 43 y.o. female, with history of chronic pain syndrome, chronic headaches, cervical radiculopathy that presents for evaluation of thoracic outlet syndrome.  Patient is referred by Dr. Ernestine Mcmurray with neurosurgery.  Patient has a number of complaints including right sided headaches, pressure behind her right eye, trouble swallowing, chest pain, right upper extremity pain into the fourth and fifth digits that have all been ongoing for about 3 years.  She does have a history of cervical disc arthroplasty at C4-C5 and C5-C6 by Dr. Marcell Barlow.  She has been followed by neurologist at [facility] and states she takes Maxalt almost on a daily basis now with no improvement.  Has not had any physical therapy for over a year.  Does have eight children and finds it difficult from a quality-of-life standpoint.  Past Medical History:  Diagnosis Date   Acute pyelonephritis 06/16/2021   Anemia    Arthritis    Headache    Peripheral neuropathy    right arm and shoulder   Pregnancy induced hypertension    Preterm labor    Vaginal Pap smear, abnormal     Past Surgical History:  Procedure Laterality Date   CERVICAL DISC ARTHROPLASTY N/A 10/14/2021   Procedure: C4-6 ARTHROPLASTY;  Surgeon: Venetia Night, MD;  Location: [facility] ORS;  Service: Neurosurgery;  Laterality: N/A;   REPAIR VAGINAL CUFF     after hysterectomy   TUBAL LIGATION Bilateral 12/30/2018   Procedure: POST PARTUM TUBAL LIGATION;  Surgeon: Ward, Elenora Fender, MD;  Location: [facility] ORS;  Service: Gynecology;  Laterality: Bilateral;   VAGINAL HYSTERECTOMY N/A 03/04/2021   Procedure: HYSTERECTOMY VAGINAL;  Surgeon: [name], MD;  Location: [facility] ORS;  Service: Gynecology;  Laterality: N/A;    Family History  Problem Relation Age of Onset   Diabetes Mother    Arthritis Father    Cancer Maternal  Grandfather    Diabetes Paternal Grandmother    Arthritis Paternal Grandmother     SOCIAL HISTORY: Social History   Socioeconomic History   Marital status: Married    Spouse name: Not on file   Number of children: Not on file   Years of education: Not on file   Highest education level: Not on file  Occupational History   Not on file  Tobacco Use   Smoking status: Every Day    Current packs/day: 1.00    Types: Cigarettes   Smokeless tobacco: Never  Vaping Use   Vaping status: Never Used  Substance and Sexual Activity   Alcohol use: Yes    Alcohol/week: 2.0 standard drinks of alcohol    Types: 1 Glasses of wine, 1 Cans of beer per week    Comment: occasionally   Drug use: No   Sexual activity: Yes    Birth control/protection: Surgical    Comment: BTL   Other Topics Concern   Not on file  Social History Narrative   Not on file   Social Drivers of Health   Financial Resource Strain: Low Risk  (04/17/2023)   Received from [facility]   Overall Financial Resource Strain (CARDIA)    Difficulty of Paying Living Expenses: Not hard at all  Food Insecurity: No Food Insecurity (04/17/2023)   Received from [facility]   Hunger Vital Sign    Ran Out of Food in the Last Year: Never true  Worried About Programme researcher, broadcasting/film/video in the Last Year: Never true  Transportation Needs: No Transportation Needs (04/17/2023)   Received from [facility]   PRAPARE - Transportation    Lack of Transportation (Non-Medical): No    In the past 12 months, has lack of transportation kept you from medical appointments or from getting medications?: No  Physical Activity: Insufficiently Active (01/22/2018)   Exercise Vital Sign    Days of Exercise per Week: 3 days    Minutes of Exercise per Session: 20 min  Stress: No Stress Concern Present (01/22/2018)   [employer] of Occupational Health - Occupational Stress Questionnaire    Feeling of Stress  : Only a little  Social Connections: Somewhat Isolated (01/22/2018)   Social Connection and Isolation Panel [NHANES]    Frequency of Communication with Friends and Family: More than three times a week    Frequency of Social Gatherings with Friends and Family: Three times a week    Attends Religious Services: Never    Active Member of Clubs or Organizations: No    Attends Banker Meetings: Not asked    Marital Status: Married  Catering manager Violence: Not At Risk (01/22/2018)   Humiliation, Afraid, Rape, and Kick questionnaire    Fear of Current or Ex-Partner: No    Emotionally Abused: No    Physically Abused: No    Sexually Abused: No    No Known Allergies  Current Outpatient Medications  Medication Sig Dispense Refill   acetaminophen (TYLENOL) 500 MG tablet Take by mouth.     clobetasol ointment (TEMOVATE) 0.05 % Apply 1 application topically daily as needed for irritation.     gabapentin (NEURONTIN) 600 MG tablet Take 600 mg in the morning, 600 mg in the afternoon, and 1200 mg at night     naloxone (NARCAN) nasal spray 4 mg/0.1 mL Place into the nose.     rizatriptan (MAXALT) 5 MG tablet Take 5 mg by mouth as needed for migraine. May repeat in 2 hours if needed     tiZANidine (ZANAFLEX) 4 MG tablet Take 4 mg by mouth 2 (two) times daily.     No current facility-administered medications for this visit.    REVIEW OF SYSTEMS:  [X]  denotes positive finding, [ ]  denotes negative finding Cardiac  Comments:  Chest pain or chest pressure:    Shortness of breath upon exertion:    Short of breath when lying flat:    Irregular heart rhythm:        Vascular    Pain in calf, thigh, or hip brought on by ambulation:    Pain in feet at night that wakes you up from your sleep:     Blood clot in your veins:    Leg swelling:         Pulmonary    Oxygen at home:    Productive cough:     Wheezing:         Neurologic    Sudden weakness in arms or legs:  x Right arm   Sudden numbness in arms or legs:  x Right arm  Sudden onset of difficulty speaking or slurred speech:    Temporary loss of vision in one eye:     Problems with dizziness:         Gastrointestinal    Blood in stool:     Vomited blood:         Genitourinary    Burning when urinating:  Blood in urine:        Psychiatric    Major depression:         Hematologic    Bleeding problems:    Problems with blood clotting too easily:        Skin    Rashes or ulcers:        Constitutional    Fever or chills:      PHYSICAL EXAM: There were no vitals filed for this visit.  GENERAL: The patient is a well-nourished female, in no acute distress. The vital signs are documented above. CARDIAC: There is a regular rate and rhythm.  VASCULAR:  Bilateral radial pulses palpable East test has right upper extremity weakness and pain after 1 minute into the 4th and 5th digit Right scalene tenderness PULMONARY: No respiratory distress. ABDOMEN: Soft and non-tender. MUSCULOSKELETAL: There are no major deformities or cyanosis. NEUROLOGIC: No focal weakness or paresthesias are detected. SKIN: There are no ulcers or rashes noted. PSYCHIATRIC: The patient has a normal affect.  DATA:    UPPER EXTREMITY DOPPLER STUDY   Patient Name:  Gloria [name]  Date of Exam:   04/16/2023  Medical Rec #: [id number]       Accession #:    [id number]  Date of Birth: 07/15/1980      Patient Gender: F  Patient Age:   63 years  Exam Location:  [facility]  Procedure:      VAS UE DOPPLER BILAT/COMP TOS, DIGITS (TO&UE REYNAUDS)  Referring Phys: Sherald Hess    ---------------------------------------------------------------------------  -----    Indications: RUE Pain, Numbness, Tingling.   Risk Factors: Current smoker.   Performing Technologist: Criss Rosales RVT     Examination Guidelines: A complete evaluation includes B-mode imaging,  spectral  Doppler, color Doppler, and power  Doppler as needed of all accessible  portions  of each vessel. Bilateral testing is considered an integral part of a  complete  examination. Limited examinations for reoccurring indications may be  performed  as noted.     Right Doppler Findings:  +--------+--------+-----+-------+--------+  Site   PressureIndexDopplerComments  +--------+--------+-----+-------+--------+  [id number]                          +--------+--------+-----+-------+--------+  Radial 180     1.19                  +--------+--------+-----+-------+--------+  Ulnar  162     1.07                  +--------+--------+-----+-------+--------+  Digit  177     1.17                  +--------+--------+-----+-------+--------+    +---+----+   WBI1.19   +---+----+   Left Doppler Findings:  +--------+--------+-----+-------+--------+  Site   PressureIndexDopplerComments  +--------+--------+-----+-------+--------+  [id number]                          +--------+--------+-----+-------+--------+  Radial 149     0.99                  +--------+--------+-----+-------+--------+  Ulnar  165     1.09                  +--------+--------+-----+-------+--------+  Digit  159     1.05                  +--------+--------+-----+-------+--------+    +---+----+  WBI1.09   +---+----+     TOS Maneuvers:  +--------------------+---------------+  Right 1st Digit FlowManuever         +--------------------+---------------+                     Hyper-Abduction  +--------------------+---------------+     Summary:    Right: No significant arterial obstruction detected in the right         upper extremity. Mild amplitude decrease is noted in the 1st         digit at arms 90 degrees and arms 180 degrees.  Left: No significant arterial obstruction detected in the left upper        extremity.  *See table(s) above for measurements and observations.      Electronically signed by Sherald Hess MD on 04/16/2023 at 3:10:10 PM.      Assessment/Plan:  43 y.o. female, with history of chronic pain syndrome, chronic headaches, cervical radiculopathy that presents for evaluation of thoracic outlet syndrome.  Patient is referred by Dr. Ernestine Mcmurray with neurosurgery.  Patient has a number of complaints including right sided headaches, pressure behind her right eye, trouble swallowing, chest pain, right upper extremity pain into the fourth and fifth digits that have all been ongoing for about 3 years.  I discussed that given her myriad of complaints, I do not think TOS alone would explain her clinical presentation.  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  Neurogenic TOS would not explain her chronic headaches with pain behind her right eye including chest pain and trouble swallowing.  She did have a positive EAST test today in the office with some right scalene tenderness so certainly could have some underlying neurogenic TOS and discussed we will send her to physical therapy for 3 months to see if this provides any improvement.  Discussed the vast majority of patients do not need surgical intervention for neurogenic TOS.  I have offered her referral to neurology here at [facility] for a second opinion particularly with her chronic headaches and this seems to be the most debilitating aspect to her quality of life at this time.  I will see her back in 3 months to monitor her clinical progress.   Cephus Shelling, MD [facility] Office: [phone]

## 2023-05-05 ENCOUNTER — Ambulatory Visit (INDEPENDENT_AMBULATORY_CARE_PROVIDER_SITE_OTHER): Payer: Medicaid Other | Admitting: Vascular Surgery

## 2023-05-05 ENCOUNTER — Encounter: Payer: Self-pay | Admitting: Vascular Surgery

## 2023-05-05 VITALS — BP 116/79 | HR 78 | Temp 98.2°F | Resp 18 | Ht 63.0 in | Wt 143.0 lb

## 2023-05-05 DIAGNOSIS — R519 Headache, unspecified: Secondary | ICD-10-CM | POA: Diagnosis not present

## 2023-05-05 DIAGNOSIS — G8929 Other chronic pain: Secondary | ICD-10-CM

## 2023-05-14 NOTE — Therapy (Deleted)
 OUTPATIENT OCCUPATIONAL THERAPY NEURO EVALUATION  Patient Name: Gloria [name] MRN: [id number] DOB:December 05, 1980, 43 y.o., female Today's Date: 05/14/2023  PCP: Alm Bustard, NP  REFERRING PROVIDER: Cephus Shelling, MD  END OF SESSION:   Past Medical History:  Diagnosis Date   Acute pyelonephritis 06/16/2021   Anemia    Arthritis    Headache    Peripheral neuropathy    right arm and shoulder   Pregnancy induced hypertension    Preterm labor    Vaginal Pap smear, abnormal    Past Surgical History:  Procedure Laterality Date   CERVICAL DISC ARTHROPLASTY N/A 10/14/2021   Procedure: C4-6 ARTHROPLASTY;  Surgeon: Venetia Night, MD;  Location: [facility] ORS;  Service: Neurosurgery;  Laterality: N/A;   REPAIR VAGINAL CUFF     after hysterectomy   TUBAL LIGATION Bilateral 12/30/2018   Procedure: POST PARTUM TUBAL LIGATION;  Surgeon: Ward, Elenora Fender, MD;  Location: [facility] ORS;  Service: Gynecology;  Laterality: Bilateral;   VAGINAL HYSTERECTOMY N/A 03/04/2021   Procedure: HYSTERECTOMY VAGINAL;  Surgeon: [name], MD;  Location: [facility] ORS;  Service: Gynecology;  Laterality: N/A;   Patient Active Problem List   Diagnosis Date Noted   Cervical radiculitis 04/13/2023   Chronic intractable headache 04/13/2023   Neck pain 04/13/2023   Drug overdose, accidental or unintentional, initial encounter 11/23/2022   Cervical myelopathy with cervical radiculopathy (HCC) 06/19/2021   Acute pyelonephritis 06/16/2021   Sepsis (HCC) 06/16/2021   Tobacco use 06/16/2021   Migraine 06/16/2021   Labor and delivery, indication for care 12/29/2018   Preterm delivery, delivered 02/02/2018   Preterm labor in third trimester 01/31/2018   Preterm uterine contractions in third trimester, antepartum 01/30/2018   Preterm uterine contractions, antepartum, third trimester 01/23/2018   SVD (spontaneous vaginal delivery) 08/16/2014   Indication for care in labor or delivery 08/15/2014   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 Marcell Barlow.  She has been followed by neurologist at [facility] and states she takes Maxalt almost on a daily basis now with no improvement.  Has not had any physical therapy for over a year.  Does have eight children and finds it difficult from a quality-of-life standpoint."  Per 04/01/23 PA-C ED Provider Notes: "past medical history of cervical myelopathy with cervical radiculopathy who presents today for evaluation of neck pain.  Patient reports that she has had neck pain for the past 3 to 4 years, with radiation down her right arm and also occasional pain in her face for which she was being worked up for trigeminal neuralgia.  She reports that she has had numerous imaging scans including MRIs, MRAs, swallow studies and more.  She has seen multiple neurosurgeons and she reports that she  has seen 10 neurologists and "no one can figure it out." She reports that she has been to [facility] [place] and [facility] "without answers."  She reports that 2 days ago  she rolled over in bed and felt a sharp pop in her right anterior neck and feels that all of her symptoms have worsened.  She reports that her pain is in the same location as it normally is, just feels more severe than usual.  She denies any visual changes.  She reports that she feels slightly dizzy, this is not new for her. ... MRI of her cervical spine in 01/23/2023 that reveals arthroplasty at C4-C5 and C5-C6 with residual mild right C5 and bilateral C6 foraminal narrowing, disc bulge at C6-C7, and disc bulge at C3-C4. Overall appearance of the cervical spine was stable compared to MRI from 04/23/2022."  PRECAUTIONS: {Therapy precautions:24002}  WEIGHT BEARING RESTRICTIONS: {Yes ***/No:24003}  PAIN:  Are you having pain? {OPRCPAIN:27236}  FALLS: Has patient fallen in last 6 months? {fallsyesno:27318}  LIVING ENVIRONMENT: Lives with: {OPRC lives with:25569::"lives with their family"} Lives in: {Lives in:25570} Stairs: {opstairs:27293} Has following equipment at home: {Assistive devices:23999}  PLOF: {PLOF:24004}  PATIENT GOALS: ***  OBJECTIVE:  Note: Objective measures were completed at Evaluation unless otherwise noted.  HAND DOMINANCE: {MISC; OT HAND DOMINANCE:[id number]}  ADLs: Overall ADLs: *** Transfers/ambulation related to ADLs: Eating: *** Grooming: *** UB Dressing: *** LB Dressing: *** Toileting: *** Bathing: *** Tub Shower transfers: *** Equipment: {equipment:25573}  IADLs: Shopping: *** Light housekeeping: *** Meal Prep: *** Community mobility: *** Medication management: *** Financial management: *** Handwriting: {OTWRITTENEXPRESSION:25361}  MOBILITY STATUS: {OTMOBILITY:25360}  POSTURE COMMENTS:  {posture:25561} Sitting balance: {sitting balance:25483}  ACTIVITY TOLERANCE: Activity tolerance: ***  FUNCTIONAL OUTCOME MEASURES: {OTFUNCTIONALMEASURES:27238}  UPPER EXTREMITY ROM:    {AROM/PROM:27142} ROM Right eval Left eval  Shoulder flexion     Shoulder abduction    Shoulder adduction    Shoulder extension    Shoulder internal rotation    Shoulder external rotation    Elbow flexion    Elbow extension    Wrist flexion    Wrist extension    Wrist ulnar deviation    Wrist radial deviation    Wrist pronation    Wrist supination    (Blank rows = not tested)  UPPER EXTREMITY MMT:     MMT Right eval Left eval  Shoulder flexion    Shoulder abduction    Shoulder adduction    Shoulder extension    Shoulder internal rotation    Shoulder external rotation    Middle trapezius    Lower trapezius    Elbow flexion    Elbow extension    Wrist flexion    Wrist extension    Wrist ulnar deviation    Wrist radial deviation    Wrist pronation    Wrist supination    (Blank rows = not tested)  HAND FUNCTION: {handfunction:27230}  COORDINATION: {otcoordination:27237}  SENSATION: {sensation:27233}  EDEMA: ***  MUSCLE TONE: {UETONE:25567}  COGNITION: Overall cognitive status: {cognition:24006}  VISION: Subjective report: *** Baseline vision: {OTBASELINEVISION:25363} Visual history: {OTVISUALHISTORY:25364}  VISION ASSESSMENT: {visionassessment:27231}  Patient has difficulty with following activities due to following visual impairments: ***  PERCEPTION: {Perception:25564}  PRAXIS: {Praxis:25565}  OBSERVATIONS: ***  TREATMENT DATE: ***         PATIENT EDUCATION: Education details: *** Person educated: {Person educated:25204} Education method: {Education Method:25205} Education comprehension: {Education Comprehension:25206}  HOME EXERCISE PROGRAM: ***   GOALS: Goals reviewed with patient? {yes/no:20286}  SHORT TERM GOALS: Target date: ***  *** Baseline: Goal status: INITIAL  2.  *** Baseline:  Goal status: INITIAL  3.  *** Baseline:  Goal status: INITIAL  4.   *** Baseline:  Goal status: INITIAL  5.  *** Baseline:  Goal status: INITIAL  6.  *** Baseline:  Goal status: INITIAL  LONG TERM GOALS: Target date: ***  *** Baseline:  Goal status: INITIAL  2.  *** Baseline:  Goal status: INITIAL  3.  *** Baseline:  Goal status: INITIAL  4.  *** Baseline:  Goal status: INITIAL  5.  *** Baseline:  Goal status: INITIAL  6.  *** Baseline:  Goal status: INITIAL  ASSESSMENT:  CLINICAL IMPRESSION: Patient is a *** y.o. *** who was seen today for occupational therapy evaluation for ***.   PERFORMANCE DEFICITS: in functional skills including {OT physical skills:25468}, cognitive skills including {OT cognitive skills:25469}, and psychosocial skills including {OT psychosocial skills:25470}.   IMPAIRMENTS: are limiting patient from {OT performance deficits:25471}.   CO-MORBIDITIES: {Comorbidities:25485} that affects occupational performance. Patient will benefit from skilled OT to address above impairments and improve overall function.  MODIFICATION OR ASSISTANCE TO COMPLETE EVALUATION: {OT modification:25474}  OT OCCUPATIONAL PROFILE AND HISTORY: {OT PROFILE AND HISTORY:25484}  CLINICAL DECISION MAKING: {OT CDM:25475}  REHAB POTENTIAL: {rehabpotential:25112}  EVALUATION COMPLEXITY: {Evaluation complexity:25115}    PLAN:  OT FREQUENCY: {rehab frequency:25116}  OT DURATION: {rehab duration:25117}  PLANNED INTERVENTIONS: {OT Interventions:25467}  RECOMMENDED OTHER SERVICES: ***  CONSULTED AND AGREED WITH PLAN OF CARE: {ZOX:09604}  PLAN FOR NEXT SESSION: ***   Wynetta Emery, OT 05/14/2023, 1:21 PM

## 2023-05-15 ENCOUNTER — Ambulatory Visit: Payer: Medicaid Other | Attending: Occupational Therapy | Admitting: Occupational Therapy

## 2023-05-25 ENCOUNTER — Ambulatory Visit: Payer: Medicaid Other | Attending: Occupational Therapy | Admitting: Occupational Therapy

## 2023-08-04 ENCOUNTER — Ambulatory Visit: Payer: Medicaid Other | Admitting: Vascular Surgery

## 2023-10-06 ENCOUNTER — Other Ambulatory Visit: Payer: Self-pay | Admitting: Family Medicine

## 2023-10-06 DIAGNOSIS — Z1231 Encounter for screening mammogram for malignant neoplasm of breast: Secondary | ICD-10-CM
